# Patient Record
Sex: Male | Born: 1950 | Race: Black or African American | Hispanic: No | Marital: Married | Smoking: Never smoker
Health system: Southern US, Community
[De-identification: ages and names within clinical notes are randomized; demographics above are authoritative.]

## PROBLEM LIST (undated history)

## (undated) DIAGNOSIS — I10 Essential (primary) hypertension: Secondary | ICD-10-CM

## (undated) DIAGNOSIS — I5189 Other ill-defined heart diseases: Secondary | ICD-10-CM

## (undated) DIAGNOSIS — M5126 Other intervertebral disc displacement, lumbar region: Secondary | ICD-10-CM

## (undated) DIAGNOSIS — E119 Type 2 diabetes mellitus without complications: Secondary | ICD-10-CM

## (undated) DIAGNOSIS — I499 Cardiac arrhythmia, unspecified: Secondary | ICD-10-CM

## (undated) DIAGNOSIS — N529 Male erectile dysfunction, unspecified: Secondary | ICD-10-CM

## (undated) DIAGNOSIS — N400 Enlarged prostate without lower urinary tract symptoms: Secondary | ICD-10-CM

## (undated) HISTORY — DX: Male erectile dysfunction, unspecified: N52.9

## (undated) HISTORY — PX: BACK SURGERY: SHX140

## (undated) HISTORY — DX: Essential (primary) hypertension: I10

## (undated) HISTORY — DX: Type 2 diabetes mellitus without complications: E11.9

## (undated) HISTORY — PX: COLONOSCOPY: SHX174

## (undated) HISTORY — DX: Benign prostatic hyperplasia without lower urinary tract symptoms: N40.0

---

## 2003-08-23 ENCOUNTER — Ambulatory Visit (HOSPITAL_COMMUNITY): Admission: RE | Admit: 2003-08-23 | Discharge: 2003-08-23 | Payer: Self-pay | Admitting: Family Medicine

## 2004-08-08 ENCOUNTER — Emergency Department (HOSPITAL_COMMUNITY): Admission: EM | Admit: 2004-08-08 | Discharge: 2004-08-08 | Payer: Self-pay | Admitting: Emergency Medicine

## 2009-11-02 ENCOUNTER — Ambulatory Visit: Payer: Self-pay | Admitting: Gastroenterology

## 2009-11-15 ENCOUNTER — Ambulatory Visit: Payer: Self-pay | Admitting: Gastroenterology

## 2009-12-11 ENCOUNTER — Encounter: Payer: Self-pay | Admitting: Gastroenterology

## 2011-05-17 NOTE — Procedures (Signed)
   Jeremy Rivera, Jeremy Rivera                         ACCOUNT NO.:  [id number]   MEDICAL RECORD NO.:  [id number]                   PATIENT TYPE:  OUT   LOCATION:  [facility]                                 FACILITY:  [facility]   PHYSICIAN:  Donna Bernard, M.D.             DATE OF BIRTH:  [birth year]   DATE OF PROCEDURE:  08/23/2003  DATE OF DISCHARGE:  08/23/2003                                    STRESS TEST   INDICATION FOR TEST:  The patient is a 60 year old black male with a history  of atypical chest discomfort along with risk factors including hypertension.   Stress test was performed at standard Bruce protocol.  Resting EKG revealed  normal sinus rhythm with no significant ST-T changes.  The patient tolerated  the first two stages relatively well.  During the third stage the patient  became progressively short of breath.  He reached a heart rate of 145 with a  sub max predicted heart rate at 141 at two minutes 45 seconds into the test.  At this point, at 0.08 seconds past the J point there were no significant  changes in the ST segment.  At 0.08 seconds past the J point on the  anterolateral leads the slope was nicely ascending and at most, the  depression was 1 mm or less.  In addition, the chest leads were somewhat  compromised by artifact.  However, at the current heart rate of 145 there  were no obvious ST abnormalities that would fit the criteria for ischemia.   IMPRESSION:  Negative adequate stress test.   PLAN:  The patient encouraged to continue his exercise program and watch his  diet.      ___________________________________________                                            Donna Bernard, M.D.   WSL/MEDQ  D:  09/20/2003  T:  09/20/2003  Job:  [id number]

## 2011-05-17 NOTE — Procedures (Signed)
NAME:  EDRIK, RUNDLE                         ACCOUNT NO.:  [id number]   MEDICAL RECORD NO.:  [id number]                   PATIENT TYPE:  EMS   LOCATION:  ED                                   FACILITY:  [facility]   PHYSICIAN:  Edward L. Juanetta Gosling, M.D.             DATE OF BIRTH:  14-Mar-1951   DATE OF PROCEDURE:  08/08/2004  DATE OF DISCHARGE:  08/08/2004                                EKG INTERPRETATION   TIME:  Is 2112 on August 08, 2004.   The rhythm appears to be sinus rhythm with a rate in the 60s.  There are  PVCs.  There is a suggestion of left atrial enlargement.  QT interval seems  to be prolonged which may indicate a drug effect, electrolyte imbalance, or  primary myocardial disease.  Abnormal electrocardiogram.      ___________________________________________                                            Oneal Deputy. Juanetta Gosling, M.D.   ELH/MEDQ  D:  08/15/2004  T:  08/15/2004  Job:  [id number]

## 2013-07-17 ENCOUNTER — Encounter: Payer: Self-pay | Admitting: *Deleted

## 2013-07-21 ENCOUNTER — Encounter: Payer: Self-pay | Admitting: Family Medicine

## 2013-07-21 ENCOUNTER — Ambulatory Visit (INDEPENDENT_AMBULATORY_CARE_PROVIDER_SITE_OTHER): Payer: BC Managed Care – PPO | Admitting: Family Medicine

## 2013-07-21 VITALS — BP 150/94 | HR 70 | Wt 259.0 lb

## 2013-07-21 DIAGNOSIS — N529 Male erectile dysfunction, unspecified: Secondary | ICD-10-CM

## 2013-07-21 DIAGNOSIS — I10 Essential (primary) hypertension: Secondary | ICD-10-CM

## 2013-07-21 DIAGNOSIS — E785 Hyperlipidemia, unspecified: Secondary | ICD-10-CM

## 2013-07-21 DIAGNOSIS — N400 Enlarged prostate without lower urinary tract symptoms: Secondary | ICD-10-CM

## 2013-07-21 DIAGNOSIS — Z79899 Other long term (current) drug therapy: Secondary | ICD-10-CM

## 2013-07-21 DIAGNOSIS — E119 Type 2 diabetes mellitus without complications: Secondary | ICD-10-CM

## 2013-07-21 NOTE — Progress Notes (Signed)
  Subjective:    Patient ID: Jeremy Rivera, male    DOB: Dec 12, 1951, 62 y.o.   MRN: [id number]  Diabetes He has type 2 diabetes mellitus. His disease course has been fluctuating. There are no hypoglycemic associated symptoms. Pertinent negatives for diabetes include no blurred vision and no fatigue. Symptoms are worsening. Pertinent negatives for diabetic complications include no autonomic neuropathy. Risk factors for coronary artery disease include dyslipidemia, diabetes mellitus, hypertension, male sex and obesity. His weight is stable. He is following a diabetic and low fat/cholesterol diet. Meal planning includes avoidance of concentrated sweets. He participates in exercise intermittently. His home blood glucose trend is fluctuating minimally. His breakfast blood glucose is taken between 7-8 am. His breakfast blood glucose range is generally 110-130 mg/dl. An ACE inhibitor/angiotensin II receptor blocker is being taken. He does not see a podiatrist.Eye exam is current.   Irritated and painful eye one wk ago, unknown bite or sting no difficulty with vision.  Results for orders placed in visit on 07/21/13  POCT GLYCOSYLATED HEMOGLOBIN (HGB A1C)      Result Value Range   Hemoglobin A1C 7.9     patient notes his erectile dysfunction did better with the Cialis.  Patient is getting up once or twice per night to urinate.  Patient claims compliance with his blood pressure medicine. He has cut his salt intake down.  Patient claims compliance with his lipid medication. Trying to watch his diet but admits to not always the best  Review of Systems  Constitutional: Negative for fatigue.  Eyes: Negative for blurred vision.   gradual weight gain. No chest pain no shortness of breath no abdominal pain ROS otherwise negative     Objective:   Physical Exam  Alert obesity present blood pressure improved on repeat 138/82. Lungs clear. Heart rare in rhythm. Feet sensation good pulses good no significant  edema. Right high slight irritation on eyelid ocular exam normal      Assessment & Plan:  Impression 1 type 2 diabetes suboptimum control discussed with patient. #2 hypertension good control. #3 hyperlipidemia status uncertain. #4 rectal dysfunction ongoing. #5 prostate hypertrophy ongoing. Plan appropriate blood work. Medications refilled. Diet exercise discussed and strongly encourage. Recheck in 6 months. WSL

## 2013-07-25 DIAGNOSIS — E785 Hyperlipidemia, unspecified: Secondary | ICD-10-CM | POA: Insufficient documentation

## 2013-07-25 DIAGNOSIS — E1165 Type 2 diabetes mellitus with hyperglycemia: Secondary | ICD-10-CM | POA: Insufficient documentation

## 2013-07-25 DIAGNOSIS — N529 Male erectile dysfunction, unspecified: Secondary | ICD-10-CM | POA: Insufficient documentation

## 2013-07-25 DIAGNOSIS — I10 Essential (primary) hypertension: Secondary | ICD-10-CM | POA: Insufficient documentation

## 2013-07-25 DIAGNOSIS — IMO0002 Reserved for concepts with insufficient information to code with codable children: Secondary | ICD-10-CM | POA: Insufficient documentation

## 2013-07-25 DIAGNOSIS — E782 Mixed hyperlipidemia: Secondary | ICD-10-CM | POA: Insufficient documentation

## 2013-07-25 DIAGNOSIS — N400 Enlarged prostate without lower urinary tract symptoms: Secondary | ICD-10-CM | POA: Insufficient documentation

## 2013-09-19 ENCOUNTER — Other Ambulatory Visit: Payer: Self-pay | Admitting: Family Medicine

## 2013-09-21 LAB — LIPID PANEL
Cholesterol: 196 mg/dL (ref 0–200)
HDL: 35 mg/dL — ABNORMAL LOW (ref >39)
Total CHOL/HDL Ratio: 5.6 Ratio
VLDL: 32 mg/dL (ref 0–40)

## 2013-09-21 LAB — HEPATIC FUNCTION PANEL
ALT: 14 U/L (ref 0–53)
AST: 13 U/L (ref 0–37)
Albumin: 4.3 g/dL (ref 3.5–5.2)
Bilirubin, Direct: 0.1 mg/dL (ref 0.0–0.3)
Total Bilirubin: 0.5 mg/dL (ref 0.3–1.2)

## 2013-09-22 LAB — MICROALBUMIN, URINE: Microalb, Ur: 0.59 mg/dL (ref 0.00–1.89)

## 2013-09-23 ENCOUNTER — Encounter: Payer: Self-pay | Admitting: Family Medicine

## 2013-12-06 ENCOUNTER — Ambulatory Visit (INDEPENDENT_AMBULATORY_CARE_PROVIDER_SITE_OTHER): Payer: BC Managed Care – PPO | Admitting: Family Medicine

## 2013-12-06 ENCOUNTER — Encounter: Payer: Self-pay | Admitting: Family Medicine

## 2013-12-06 VITALS — BP 128/86 | Ht 71.5 in | Wt 263.8 lb

## 2013-12-06 DIAGNOSIS — E119 Type 2 diabetes mellitus without complications: Secondary | ICD-10-CM

## 2013-12-06 LAB — POCT GLYCOSYLATED HEMOGLOBIN (HGB A1C): Hemoglobin A1C: 7.6

## 2013-12-06 NOTE — Progress Notes (Signed)
   Subjective:    Patient ID: Jeremy Rivera, male    DOB: 1951/01/19, 62 y.o.   MRN: [id number]  HPI Patient arrives for an annual physical.   Has not had shingles vaccine.pneum 2007. insur won't pay for flu shot.  No problems or concerns per patient.  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  Exercsing: none,  Deep left thigh pain for awhile, now sig better  Results for orders placed in visit on 12/06/13  POCT GLYCOSYLATED HEMOGLOBIN (HGB A1C)      Result Value Range   Hemoglobin A1C 7.6     No exercise equip around the house. Now daught er hsw his treadmill   Review of Systems  Constitutional: Negative for fever, activity change and appetite change.  HENT: Negative for congestion and rhinorrhea.   Eyes: Negative for discharge.  Respiratory: Negative for cough and wheezing.   Cardiovascular: Negative for chest pain.  Gastrointestinal: Negative for vomiting, abdominal pain and blood in stool.  Genitourinary: Negative for frequency and difficulty urinating.  Musculoskeletal: Negative for neck pain.  Skin: Negative for rash.  Allergic/Immunologic: Negative for environmental allergies and food allergies.  Neurological: Negative for weakness and headaches.  Psychiatric/Behavioral: Negative for agitation.       Objective:   Physical Exam  Vitals reviewed. Constitutional: He appears well-developed and well-nourished.  HENT:  Head: Normocephalic and atraumatic.  Right Ear: External ear normal.  Left Ear: External ear normal.  Nose: Nose normal.  Mouth/Throat: Oropharynx is clear and moist.  Eyes: EOM are normal. Pupils are equal, round, and reactive to light.  Neck: Normal range of motion. Neck supple. No thyromegaly present.  Cardiovascular: Normal rate, regular rhythm and normal heart sounds.   No murmur heard. Pulmonary/Chest: Effort normal and breath sounds normal. No respiratory distress. He has no wheezes.  Abdominal: Soft. Bowel sounds are normal. He  exhibits no distension and no mass. There is no tenderness.  Genitourinary: Penis normal.  Musculoskeletal: Normal range of motion. He exhibits no edema.  Lymphadenopathy:    He has no cervical adenopathy.  Neurological: He is alert. He exhibits normal muscle tone.  Skin: Skin is warm and dry. No erythema.  Psychiatric: He has a normal mood and affect. His behavior is normal. Judgment normal.          Assessment & Plan:  Impression wellness exam #2 hypertension good control. #3 type 2 diabetes fair control #4 prostate hypertrophy discussed #5 hyperlipidemia plan maintain medications. Diet exercise discussed. Appropriate blood work. Vaccines discussed. WSL

## 2013-12-17 ENCOUNTER — Other Ambulatory Visit: Payer: Self-pay | Admitting: Family Medicine

## 2013-12-22 ENCOUNTER — Other Ambulatory Visit: Payer: Self-pay | Admitting: Family Medicine

## 2014-03-22 ENCOUNTER — Ambulatory Visit (INDEPENDENT_AMBULATORY_CARE_PROVIDER_SITE_OTHER): Payer: BC Managed Care – PPO | Admitting: Family Medicine

## 2014-03-22 ENCOUNTER — Encounter: Payer: Self-pay | Admitting: Family Medicine

## 2014-03-22 VITALS — BP 156/98 | Ht 71.5 in | Wt 261.0 lb

## 2014-03-22 DIAGNOSIS — E119 Type 2 diabetes mellitus without complications: Secondary | ICD-10-CM

## 2014-03-22 DIAGNOSIS — I10 Essential (primary) hypertension: Secondary | ICD-10-CM

## 2014-03-22 DIAGNOSIS — Z125 Encounter for screening for malignant neoplasm of prostate: Secondary | ICD-10-CM

## 2014-03-22 DIAGNOSIS — E785 Hyperlipidemia, unspecified: Secondary | ICD-10-CM

## 2014-03-22 LAB — POCT GLYCOSYLATED HEMOGLOBIN (HGB A1C): Hemoglobin A1C: 8.8

## 2014-03-22 MED ORDER — GLIPIZIDE 5 MG PO TABS: 5.0000 mg | ORAL_TABLET | Freq: Two times a day (BID) | ORAL | Status: DC

## 2014-03-22 MED ORDER — SILDENAFIL CITRATE 100 MG PO TABS: 100.0000 mg | ORAL_TABLET | ORAL | Status: DC | PRN

## 2014-03-22 NOTE — Progress Notes (Signed)
   Subjective:    Patient ID: Jeremy McardleRonnie E Rivera, male    DOB: 1951/12/19, 63 y.o.   MRN: [id number]  Diabetes He presents for his follow-up diabetic visit. He has type 2 diabetes mellitus. There are no hypoglycemic associated symptoms. There are no diabetic associated symptoms. There are no hypoglycemic complications. Current diabetic treatment includes diet. He rarely participates in exercise. There is no change in his home blood glucose trend. His breakfast blood glucose range is generally 130-140 mg/dl.   Results for orders placed in visit on 03/22/14  POCT GLYCOSYLATED HEMOGLOBIN (HGB A1C)      Result Value Ref Range   Hemoglobin A1C 8.8      Taking the glyb around once or twice per day fact in fact most days in fact most days patient states most days he uses only one.  No low sugar spells  Patient reports that in the past Viagra has worked better for him so he like to switch back to that.  Compliant with blood pressure medicines. No obvious side effects. Watching his salt intake.  Admits to not the best dietary compliance.   Numb when ck'ed 140 to 164  Exercise so so,   BP meds overall good compliance,   Review of Systems  no headache or chest pain no back pain no abdominal pain no change in bowel habits no blood in stool    Objective:   Physical Exam  Alert no apparent distress. HEENT normal. Vitals reviewed. Blood pressure good on repeat 138/70. Lungs clear. Heart regular in rhythm. Ankles edema.   impression hypertension good control. #2 rectal dysfunction ongoing challenge. #3 type 2 diabetes poor control discussed. We need to change. Plan increase medication. We'll change glyburide to glipizide. 5 mg twice a day. Maintain other meds. Utilize Viagra. Diet exercise discussed. Recheck in several months. Appropriate blood work further recommendations based on results WSL     Assessment & Plan: impression 1see above

## 2014-03-22 NOTE — Patient Instructions (Signed)
CALL US in two weeks with fasting numbers!!!!!!!!!!!!!!!!!!!!!!!!!!!!!!!!!!!!!!!!!!!!!!!!!!!!!!!!!!!

## 2014-03-24 ENCOUNTER — Encounter: Payer: Self-pay | Admitting: Family Medicine

## 2014-03-25 ENCOUNTER — Telehealth: Payer: Self-pay | Admitting: Family Medicine

## 2014-03-25 NOTE — Telephone Encounter (Signed)
Patient needs new prescription for glucose monitor and strips called into [facility]

## 2014-03-25 NOTE — Telephone Encounter (Signed)
Script faxed to pharmacy. Patient was notified.  

## 2014-04-05 LAB — LIPID PANEL
Cholesterol: 184 mg/dL (ref 0–200)
HDL: 37 mg/dL — AB (ref >39)
LDL CALC: 125 mg/dL — AB (ref 0–99)
TRIGLYCERIDES: 111 mg/dL (ref <150)
Total CHOL/HDL Ratio: 5 Ratio
VLDL: 22 mg/dL (ref 0–40)

## 2014-04-05 LAB — BASIC METABOLIC PANEL
BUN: 13 mg/dL (ref 6–23)
CALCIUM: 9.4 mg/dL (ref 8.4–10.5)
CHLORIDE: 99 meq/L (ref 96–112)
CO2: 29 mEq/L (ref 19–32)
CREATININE: 0.9 mg/dL (ref 0.50–1.35)
Glucose, Bld: 139 mg/dL — ABNORMAL HIGH (ref 70–99)
Potassium: 4.1 mEq/L (ref 3.5–5.3)
Sodium: 139 mEq/L (ref 135–145)

## 2014-04-06 LAB — PSA: PSA: 6.47 ng/mL — AB (ref <=4.00)

## 2014-04-11 ENCOUNTER — Telehealth: Payer: Self-pay | Admitting: Family Medicine

## 2014-04-11 NOTE — Telephone Encounter (Signed)
Increase glipizide to one and a half tab each morn and one tab each eve

## 2014-04-11 NOTE — Telephone Encounter (Signed)
Left message to return call 

## 2014-04-11 NOTE — Telephone Encounter (Signed)
Patients BS readings for the last 2 weeks: 152 152 151 140 137 143 124 133 148 148  142 141 135 127 127

## 2014-04-14 NOTE — Telephone Encounter (Signed)
Notified patient increase glipizide to one and a half tab each morn and one tab each eve. Patient verbalized understanding.

## 2014-04-27 ENCOUNTER — Other Ambulatory Visit: Payer: Self-pay | Admitting: Family Medicine

## 2014-05-19 ENCOUNTER — Ambulatory Visit (INDEPENDENT_AMBULATORY_CARE_PROVIDER_SITE_OTHER): Payer: Medicare Other | Admitting: Family Medicine

## 2014-05-19 ENCOUNTER — Encounter: Payer: Self-pay | Admitting: Family Medicine

## 2014-05-19 ENCOUNTER — Telehealth: Payer: Self-pay | Admitting: Family Medicine

## 2014-05-19 VITALS — BP 170/100 | Ht 71.5 in | Wt 268.0 lb

## 2014-05-19 DIAGNOSIS — M25569 Pain in unspecified knee: Secondary | ICD-10-CM | POA: Diagnosis not present

## 2014-05-19 MED ORDER — ETODOLAC 400 MG PO TABS: 400.0000 mg | ORAL_TABLET | Freq: Two times a day (BID) | ORAL | Status: DC

## 2014-05-19 MED ORDER — HYDROCODONE-ACETAMINOPHEN 5-325 MG PO TABS: 1.0000 | ORAL_TABLET | Freq: Four times a day (QID) | ORAL | Status: DC | PRN

## 2014-05-19 MED ORDER — HYDROCODONE-ACETAMINOPHEN 5-325 MG PO TABS: 1.0000 | ORAL_TABLET | ORAL | Status: DC | PRN

## 2014-05-19 MED ORDER — GLIPIZIDE 5 MG PO TABS: 5.0000 mg | ORAL_TABLET | Freq: Two times a day (BID) | ORAL | Status: DC

## 2014-05-19 MED ORDER — HYDROCODONE-ACETAMINOPHEN 5-325 MG PO TABS: 30.0000 | ORAL_TABLET | Freq: Four times a day (QID) | ORAL | Status: DC | PRN

## 2014-05-19 NOTE — Telephone Encounter (Signed)
Done

## 2014-05-19 NOTE — Telephone Encounter (Signed)
Patient needs refill of glipizide 5 mg tab

## 2014-05-19 NOTE — Progress Notes (Signed)
   Subjective:    Patient ID: Jeremy Rivera, male    DOB: 08/12/1951, 63 y.o.   MRN: [id number]  Leg Pain  Incident onset: Monday. The incident occurred at home. There was no injury mechanism ("Over did-it" at work. Is not supposed to be lifting anything over 50lbs, and he over excerted himself at work last week.). The pain is present in the left thigh (left calf). The quality of the pain is described as aching and stabbing. The pain has been fluctuating since onset. He reports no foreign bodies present. The symptoms are aggravated by weight bearing (lying in the bed). He has tried NSAIDs for the symptoms. The treatment provided mild relief.   Had back surgery 20 years ago, and this pain is similar to that pain.  Of note did substantial extra work around the yard yesterday  This no change in urinary or bowel habits Review of Systems No fever no chest pain no abdominal pain no specific back pain.    Objective:   Physical Exam  Alert no apparent distress. Lungs clear. Heart regular in rhythm. Spine nontender. Left leg straight leg raise negative. Some deep lateral but it discomfort to deep palpation but not a lot      Assessment & Plan:    Impression nonspecific leg pain doubt true sciatica. Rationale discussed. Plan trial of anti-inflammatory medicine. Hydrocodone when necessary. Expect gradual resolution if not return for more further workup and

## 2014-05-25 ENCOUNTER — Telehealth: Payer: Self-pay | Admitting: Family Medicine

## 2014-05-25 MED ORDER — CHLORZOXAZONE 500 MG PO TABS: 500.0000 mg | ORAL_TABLET | Freq: Three times a day (TID) | ORAL | Status: DC | PRN

## 2014-05-25 NOTE — Telephone Encounter (Signed)
Patient is hoping that we can call him in a muscle relaxer, because he is still have the leg pain he was seen for last week. He said when he wakes up in the morning, it is like a cramp in muscle of calf. Its a little better, but not completely.    [facility].

## 2014-05-25 NOTE — Telephone Encounter (Signed)
Chlorzoxazone 5oo numb thirty one tid prn spasm, if persists into next wk rec f u visit

## 2014-05-25 NOTE — Telephone Encounter (Signed)
Patient is hoping that we can call him in a muscle relaxer, because he is still have the leg pain he was seen for last week. He said when he wakes up in the morning, it is like a cramp in muscle of calf. Its a little better, but not completely. ° ° ° °[facility]. °

## 2014-05-25 NOTE — Telephone Encounter (Signed)
Rx sent electronically to pharmacy. Patient notified. 

## 2014-06-02 ENCOUNTER — Telehealth: Payer: Self-pay | Admitting: Family Medicine

## 2014-06-02 NOTE — Telephone Encounter (Addendum)
Patient says that the muscle relaxer, anti-inflammatory, and pain medicine has helped some for the pain he was experiencing in his leg, but just doesn't feel like it's doing enough. He says that he wanted to come in for an appointment to have Dr. Brett Canales reassess, but at this point, we are looking at him not being able to get an appointment until week after next. He thinks he may need an xray and change up his medications. Please advise.  [facility]

## 2014-06-02 NOTE — Telephone Encounter (Signed)
Let's see tomorrow

## 2014-06-03 ENCOUNTER — Encounter: Payer: Self-pay | Admitting: Family Medicine

## 2014-06-03 ENCOUNTER — Ambulatory Visit (INDEPENDENT_AMBULATORY_CARE_PROVIDER_SITE_OTHER): Payer: Medicare Other | Admitting: Family Medicine

## 2014-06-03 ENCOUNTER — Other Ambulatory Visit: Payer: Self-pay

## 2014-06-03 VITALS — BP 170/104 | Ht 71.5 in | Wt 262.2 lb

## 2014-06-03 DIAGNOSIS — M5430 Sciatica, unspecified side: Secondary | ICD-10-CM

## 2014-06-03 MED ORDER — HYDROCODONE-ACETAMINOPHEN 5-325 MG PO TABS: 1.0000 | ORAL_TABLET | Freq: Four times a day (QID) | ORAL | Status: DC | PRN

## 2014-06-03 NOTE — Progress Notes (Signed)
   Subjective:    Patient ID: Jeremy Rivera, male    DOB: [birth year], 63 y.o.   MRN: [id number]  HPI Patient arrives for follow up on left leg pain. Patient states he is currently on pain med and muscle relaxer but has seen little improvement. Patient states he has not taken his blood pressure med this am and that is why his blood pressure is so high.  Hx of low back surg, had disc surgery, had a work injury remotely  No sig pain in the back  Had pressure on hard surface by sitting, plus excessive yard work and lifting and pulling stuff in the yard   At times a deep ache which impacts upon sleep. Definitely effecting ability to do what he needs to do.   Deep ache in the left posterior hip deep ache in the left side. Deep ache in the left calf. Comes on with any kind of walking or movement. Anti-inflammatory subsequent helped some. Review of Systems No chest pain no change in bowel habits no blood in stool no urinary symptoms    Objective:   Physical Exam Alert positive distress with certain motions vital signs stable. Lungs clear. Heart regular in rhythm. Left leg exam distal sensation pulses good. Reflex question diminished at ankle. Knees positive crepitations. Significant straight leg raise on left. Posterior left sciatic notch tenderness. Range of motion of hip decent. No low back tenderness       Assessment & Plan:  Impression progressive sciatica. With history of prior surgery. Definitely impacting significantly. Plan  No prednisone beause of diabetes. Re further recommendations based on results. WSLfill hydrocodone. Maintain anti-inflammatory. MRI

## 2014-06-07 DIAGNOSIS — M47817 Spondylosis without myelopathy or radiculopathy, lumbosacral region: Secondary | ICD-10-CM | POA: Diagnosis not present

## 2014-06-10 ENCOUNTER — Telehealth: Payer: Self-pay | Admitting: Family Medicine

## 2014-06-10 ENCOUNTER — Other Ambulatory Visit: Payer: Self-pay | Admitting: Family Medicine

## 2014-06-10 DIAGNOSIS — M5116 Intervertebral disc disorders with radiculopathy, lumbar region: Secondary | ICD-10-CM

## 2014-06-10 NOTE — Telephone Encounter (Signed)
Results discussed with patient. Patient verbalized understanding and stated he would like to see neurosur[place] your [place].

## 2014-06-10 NOTE — Telephone Encounter (Signed)
His MRI was reviewed in detail. 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 There also seems to be some impingement of the nerve running in on the left side. He might benefit from seeing a specialist. More than likely Jeremy Rivera. would lean toward trying injections first. If that did not help they would consider surgery. We [place] or specialist of his choice. Please discuss with patient and let us know.

## 2014-06-10 NOTE — Telephone Encounter (Signed)
The referral was put into motion 

## 2014-06-10 NOTE — Telephone Encounter (Signed)
Results of MRI

## 2014-06-13 ENCOUNTER — Encounter: Payer: Self-pay | Admitting: Family Medicine

## 2014-06-14 ENCOUNTER — Other Ambulatory Visit: Payer: Self-pay | Admitting: Neurosurgery

## 2014-06-14 DIAGNOSIS — I10 Essential (primary) hypertension: Secondary | ICD-10-CM | POA: Diagnosis not present

## 2014-06-14 DIAGNOSIS — Z6835 Body mass index (BMI) 35.0-35.9, adult: Secondary | ICD-10-CM | POA: Diagnosis not present

## 2014-06-14 DIAGNOSIS — M5126 Other intervertebral disc displacement, lumbar region: Secondary | ICD-10-CM | POA: Diagnosis not present

## 2014-06-20 ENCOUNTER — Encounter (HOSPITAL_COMMUNITY)
Admission: RE | Admit: 2014-06-20 | Discharge: 2014-06-20 | Disposition: A | Discharge status: Home / Self Care | Payer: Medicare Other | Source: Ambulatory Visit | Attending: Anesthesiology | Admitting: Anesthesiology

## 2014-06-20 ENCOUNTER — Encounter (HOSPITAL_COMMUNITY)
Admission: RE | Admit: 2014-06-20 | Discharge: 2014-06-20 | Disposition: A | Discharge status: Home / Self Care | Payer: Medicare Other | Source: Ambulatory Visit | Attending: Neurosurgery | Admitting: Neurosurgery

## 2014-06-20 ENCOUNTER — Encounter (HOSPITAL_COMMUNITY): Payer: Self-pay

## 2014-06-20 DIAGNOSIS — E119 Type 2 diabetes mellitus without complications: Secondary | ICD-10-CM | POA: Diagnosis not present

## 2014-06-20 DIAGNOSIS — M5126 Other intervertebral disc displacement, lumbar region: Secondary | ICD-10-CM | POA: Diagnosis not present

## 2014-06-20 DIAGNOSIS — I10 Essential (primary) hypertension: Secondary | ICD-10-CM | POA: Diagnosis not present

## 2014-06-20 DIAGNOSIS — Z01812 Encounter for preprocedural laboratory examination: Secondary | ICD-10-CM | POA: Diagnosis not present

## 2014-06-20 DIAGNOSIS — Z01810 Encounter for preprocedural cardiovascular examination: Secondary | ICD-10-CM | POA: Diagnosis not present

## 2014-06-20 DIAGNOSIS — Z6835 Body mass index (BMI) 35.0-35.9, adult: Secondary | ICD-10-CM | POA: Diagnosis not present

## 2014-06-20 DIAGNOSIS — Z01818 Encounter for other preprocedural examination: Secondary | ICD-10-CM | POA: Diagnosis not present

## 2014-06-20 HISTORY — DX: Other intervertebral disc displacement, lumbar region: M51.26

## 2014-06-20 LAB — SURGICAL PCR SCREEN
MRSA, PCR: NEGATIVE
Staphylococcus aureus: NEGATIVE

## 2014-06-20 LAB — BASIC METABOLIC PANEL
BUN: 14 mg/dL (ref 6–23)
CO2: 27 mEq/L (ref 19–32)
Calcium: 9.9 mg/dL (ref 8.4–10.5)
Chloride: 102 mEq/L (ref 96–112)
Creatinine, Ser: 0.96 mg/dL (ref 0.50–1.35)
GFR calc Af Amer: >90 mL/min (ref >90)
GFR, EST NON AFRICAN AMERICAN: 85 mL/min — AB (ref >90)
GLUCOSE: 108 mg/dL — AB (ref 70–99)
Potassium: 4.2 mEq/L (ref 3.7–5.3)
SODIUM: 142 meq/L (ref 137–147)

## 2014-06-20 LAB — CBC
HCT: 45 % (ref 39.0–52.0)
Hemoglobin: 14.5 g/dL (ref 13.0–17.0)
MCH: 27.9 pg (ref 26.0–34.0)
MCHC: 32.2 g/dL (ref 30.0–36.0)
MCV: 86.5 fL (ref 78.0–100.0)
PLATELETS: 223 10*3/uL (ref 150–400)
RBC: 5.2 MIL/uL (ref 4.22–5.81)
RDW: 14.5 % (ref 11.5–15.5)
WBC: 7 10*3/uL (ref 4.0–10.5)

## 2014-06-20 NOTE — Pre-Procedure Instructions (Signed)
Jeremy Rivera  06/20/2014   Your procedure is scheduled on:  Wednesday, June 24  Report to [facility] [facility] Admitting at 1:30 PM.  Call this number if you have problems the morning of surgery: [phone]   Remember:   Do not eat food or drink liquids after midnight.Tuesday night     Take these medicines the morning of surgery with A SIP OF WATER: atenolol,Hydrcodone,if needed  Stop aspirin, vitamins, any blood thinners or herbal medications or teas.   Do not wear jewelry, make-up or nail polish.  Do not wear lotions, powders, or perfumes. You may wear deodorant.  Do not shave 48 hours prior to surgery. Men may shave face and neck.  Do not bring valuables to the hospital.  [facility] is not responsible    for any belongings or valuables.               Contacts, dentures or bridgework may not be worn into surgery.  Leave suitcase in the car. After surgery it may be brought to your room.  For patients admitted to the hospital, discharge time is determined by your    treatment team.                Special Instructions: [place] - Preparing for Surgery  Before surgery, you can play an important role.  Because skin is not sterile, your skin needs to be as free of germs as possible.  You can reduce the number of germs on you skin by washing with CHG (chlorahexidine gluconate) soap before surgery.  CHG is an antiseptic cleaner which kills germs and bonds with the skin to continue killing germs even after washing.  Please DO NOT use if you have an allergy to CHG or antibacterial soaps.  If your skin becomes reddened/irritated stop using the CHG and inform your nurse when you arrive at Short Stay.  Do not shave (including legs and underarms) for at least 48 hours prior to the first CHG shower.  You may shave your face.  Please follow these instructions carefully:   1.  Shower with CHG Soap the night before surgery and the   morning of Surgery.  2.  If you choose to wash your  hair, wash your hair first as usual with your   normal shampoo.  3.  After you shampoo, rinse your hair and body thoroughly to remove the   Shampoo.  4.  Use CHG as you would any other liquid soap.  You can apply chg directly  to the skin and wash gently with scrungie or a clean washcloth.  5.  Apply the CHG Soap to your body ONLY FROM THE NECK DOWN.   Do not use on open wounds or open sores.  Avoid contact with your eyes,  ears, mouth and genitals (private parts).  Wash genitals (private parts)   with your normal soap.  6.  Wash thoroughly, paying special attention to the area where your surgery   will be performed.  7.  Thoroughly rinse your body with warm water from the neck down.  8.  DO NOT shower/wash with your normal soap after using and rinsing off   the CHG Soap.  9.  Pat yourself dry with a clean towel.            10.  Wear clean pajamas.            11.  Place clean sheets on your bed the night of your  first shower and do not   sleep with pets.  Day of Surgery  Do not apply any lotions/deoderants the morning of surgery.  Please wear clean clothes to the hospital/surgery center.     Please read over the following fact sheets that you were given: Pain Booklet, Coughing and Deep Breathing and Surgical Site Infection Prevention

## 2014-06-20 NOTE — Progress Notes (Signed)
06/20/14 1515  OBSTRUCTIVE SLEEP APNEA  Have you ever been diagnosed with sleep apnea through a sleep study? No  Do you snore loudly (loud enough to be heard through closed doors)?  0  Do you often feel tired, fatigued, or sleepy during the daytime? 0  Has anyone observed you stop breathing during your sleep? 0  Do you have, or are you being treated for high blood pressure? 1  BMI more than 35 kg/m2? 1  Age over 65 years old? 1  Neck circumference greater than 40 cm/16 inches? 1  Gender: 1  Obstructive Sleep Apnea Score 5  Score 4 or greater  Results sent to PCP (Dr Ivan AnchorsSteve Lukin, [facility])

## 2014-06-21 ENCOUNTER — Encounter (HOSPITAL_COMMUNITY): Payer: Self-pay | Admitting: Pharmacy Technician

## 2014-06-21 MED ORDER — CEFAZOLIN SODIUM-DEXTROSE 2-3 GM-% IV SOLR
2.0000 g | INTRAVENOUS | Status: AC
  Administered 2014-06-22: 2 g via INTRAVENOUS
  Filled 2014-06-21: qty 50

## 2014-06-22 ENCOUNTER — Encounter (HOSPITAL_COMMUNITY)
Admission: RE | Disposition: A | Discharge status: Home / Self Care | Payer: Self-pay | Source: Ambulatory Visit | Attending: Neurosurgery

## 2014-06-22 ENCOUNTER — Encounter (HOSPITAL_COMMUNITY): Payer: Medicare Other | Admitting: Certified Registered Nurse Anesthetist

## 2014-06-22 ENCOUNTER — Ambulatory Visit (HOSPITAL_COMMUNITY)
Admission: RE | Admit: 2014-06-22 | Discharge: 2014-06-23 | Disposition: A | Discharge status: Home / Self Care | Payer: Medicare Other | Source: Ambulatory Visit | Attending: Neurosurgery | Admitting: Neurosurgery

## 2014-06-22 ENCOUNTER — Encounter (HOSPITAL_COMMUNITY): Payer: Self-pay | Admitting: Certified Registered Nurse Anesthetist

## 2014-06-22 ENCOUNTER — Ambulatory Visit (HOSPITAL_COMMUNITY): Payer: Medicare Other

## 2014-06-22 ENCOUNTER — Ambulatory Visit (HOSPITAL_COMMUNITY): Payer: Medicare Other | Admitting: Certified Registered Nurse Anesthetist

## 2014-06-22 DIAGNOSIS — Z01812 Encounter for preprocedural laboratory examination: Secondary | ICD-10-CM | POA: Insufficient documentation

## 2014-06-22 DIAGNOSIS — M5126 Other intervertebral disc displacement, lumbar region: Secondary | ICD-10-CM | POA: Insufficient documentation

## 2014-06-22 DIAGNOSIS — M5020 Other cervical disc displacement, unspecified cervical region: Secondary | ICD-10-CM | POA: Diagnosis present

## 2014-06-22 DIAGNOSIS — Z01818 Encounter for other preprocedural examination: Secondary | ICD-10-CM | POA: Diagnosis not present

## 2014-06-22 DIAGNOSIS — Z01810 Encounter for preprocedural cardiovascular examination: Secondary | ICD-10-CM | POA: Diagnosis not present

## 2014-06-22 DIAGNOSIS — E119 Type 2 diabetes mellitus without complications: Secondary | ICD-10-CM | POA: Insufficient documentation

## 2014-06-22 DIAGNOSIS — I10 Essential (primary) hypertension: Secondary | ICD-10-CM | POA: Insufficient documentation

## 2014-06-22 DIAGNOSIS — Z6835 Body mass index (BMI) 35.0-35.9, adult: Secondary | ICD-10-CM | POA: Insufficient documentation

## 2014-06-22 DIAGNOSIS — M519 Unspecified thoracic, thoracolumbar and lumbosacral intervertebral disc disorder: Secondary | ICD-10-CM | POA: Diagnosis not present

## 2014-06-22 HISTORY — PX: LUMBAR LAMINECTOMY/DECOMPRESSION MICRODISCECTOMY: SHX5026

## 2014-06-22 LAB — GLUCOSE, CAPILLARY
GLUCOSE-CAPILLARY: 130 mg/dL — AB (ref 70–99)
GLUCOSE-CAPILLARY: 125 mg/dL — AB (ref 70–99)
GLUCOSE-CAPILLARY: 215 mg/dL — AB (ref 70–99)

## 2014-06-22 SURGERY — LUMBAR LAMINECTOMY/DECOMPRESSION MICRODISCECTOMY 1 LEVEL
Anesthesia: General | Site: Back | Laterality: Left

## 2014-06-22 MED ORDER — ACETAMINOPHEN 650 MG RE SUPP: 650.0000 mg | RECTAL | Status: DC | PRN

## 2014-06-22 MED ORDER — ASPIRIN 81 MG PO CHEW
81.0000 mg | CHEWABLE_TABLET | Freq: Every day | ORAL | Status: DC
  Administered 2014-06-22 – 2014-06-23 (×2): 81 mg via ORAL
  Filled 2014-06-22 (×2): qty 1

## 2014-06-22 MED ORDER — AMLODIPINE BESY-BENAZEPRIL HCL 10-20 MG PO CAPS: 1.0000 | ORAL_CAPSULE | Freq: Every day | ORAL | Status: DC

## 2014-06-22 MED ORDER — ROCURONIUM BROMIDE 50 MG/5ML IV SOLN
INTRAVENOUS | Status: AC
  Filled 2014-06-22: qty 1

## 2014-06-22 MED ORDER — DIAZEPAM 5 MG PO TABS
5.0000 mg | ORAL_TABLET | Freq: Four times a day (QID) | ORAL | Status: DC | PRN
  Administered 2014-06-23: 5 mg via ORAL
  Filled 2014-06-22: qty 1

## 2014-06-22 MED ORDER — OXYCODONE HCL 5 MG PO TABS: 5.0000 mg | ORAL_TABLET | Freq: Once | ORAL | Status: DC | PRN

## 2014-06-22 MED ORDER — LIDOCAINE HCL (CARDIAC) 20 MG/ML IV SOLN
INTRAVENOUS | Status: AC
  Filled 2014-06-22: qty 5

## 2014-06-22 MED ORDER — HYDROMORPHONE HCL PF 1 MG/ML IJ SOLN
INTRAMUSCULAR | Status: AC
  Filled 2014-06-22: qty 1

## 2014-06-22 MED ORDER — LACTATED RINGERS IV SOLN
INTRAVENOUS | Status: DC | PRN
  Administered 2014-06-22 (×2): via INTRAVENOUS

## 2014-06-22 MED ORDER — ONDANSETRON HCL 4 MG/2ML IJ SOLN: 4.0000 mg | INTRAMUSCULAR | Status: DC | PRN

## 2014-06-22 MED ORDER — MENTHOL 3 MG MT LOZG: 1.0000 | LOZENGE | OROMUCOSAL | Status: DC | PRN

## 2014-06-22 MED ORDER — LACTATED RINGERS IV SOLN
INTRAVENOUS | Status: DC
  Administered 2014-06-22: 13:00:00 via INTRAVENOUS

## 2014-06-22 MED ORDER — AMLODIPINE BESYLATE 10 MG PO TABS
10.0000 mg | ORAL_TABLET | Freq: Every day | ORAL | Status: DC
  Administered 2014-06-22 – 2014-06-23 (×3): 10 mg via ORAL
  Filled 2014-06-22 (×3): qty 1

## 2014-06-22 MED ORDER — ONDANSETRON HCL 4 MG/2ML IJ SOLN
INTRAMUSCULAR | Status: DC | PRN
  Administered 2014-06-22: 4 mg via INTRAVENOUS

## 2014-06-22 MED ORDER — SODIUM CHLORIDE 0.9 % IJ SOLN: 3.0000 mL | INTRAMUSCULAR | Status: DC | PRN

## 2014-06-22 MED ORDER — STERILE WATER FOR INJECTION IJ SOLN
INTRAMUSCULAR | Status: AC
  Filled 2014-06-22: qty 10

## 2014-06-22 MED ORDER — MEPERIDINE HCL 25 MG/ML IJ SOLN: 6.2500 mg | INTRAMUSCULAR | Status: DC | PRN

## 2014-06-22 MED ORDER — EPHEDRINE SULFATE 50 MG/ML IJ SOLN
INTRAMUSCULAR | Status: DC | PRN
  Administered 2014-06-22 (×2): 5 mg via INTRAVENOUS
  Administered 2014-06-22 (×4): 10 mg via INTRAVENOUS

## 2014-06-22 MED ORDER — GLYCOPYRROLATE 0.2 MG/ML IJ SOLN
INTRAMUSCULAR | Status: DC | PRN
  Administered 2014-06-22: .8 mg via INTRAVENOUS

## 2014-06-22 MED ORDER — THROMBIN 5000 UNITS EX SOLR
CUTANEOUS | Status: DC | PRN
  Administered 2014-06-22 (×2): 5000 [IU] via TOPICAL

## 2014-06-22 MED ORDER — POLYETHYLENE GLYCOL 3350 17 G PO PACK
17.0000 g | PACK | Freq: Every day | ORAL | Status: DC | PRN
  Filled 2014-06-22: qty 1

## 2014-06-22 MED ORDER — TRIAMTERENE-HCTZ 75-50 MG PO TABS
0.5000 | ORAL_TABLET | Freq: Every day | ORAL | Status: DC
  Administered 2014-06-22: 0.5 via ORAL
  Filled 2014-06-22 (×4): qty 0.5

## 2014-06-22 MED ORDER — SODIUM CHLORIDE 0.9 % IJ SOLN
3.0000 mL | Freq: Two times a day (BID) | INTRAMUSCULAR | Status: DC
  Administered 2014-06-22: 3 mL via INTRAVENOUS

## 2014-06-22 MED ORDER — GLIPIZIDE 5 MG PO TABS
5.0000 mg | ORAL_TABLET | Freq: Two times a day (BID) | ORAL | Status: DC
  Administered 2014-06-22 – 2014-06-23 (×2): 5 mg via ORAL
  Filled 2014-06-22 (×3): qty 1

## 2014-06-22 MED ORDER — BENAZEPRIL HCL 20 MG PO TABS
20.0000 mg | ORAL_TABLET | Freq: Every day | ORAL | Status: DC
  Administered 2014-06-22 – 2014-06-23 (×2): 20 mg via ORAL
  Filled 2014-06-22 (×3): qty 1

## 2014-06-22 MED ORDER — PROPOFOL 10 MG/ML IV BOLUS
INTRAVENOUS | Status: AC
  Filled 2014-06-22: qty 20

## 2014-06-22 MED ORDER — PHENOL 1.4 % MT LIQD: 1.0000 | OROMUCOSAL | Status: DC | PRN

## 2014-06-22 MED ORDER — ONDANSETRON HCL 4 MG/2ML IJ SOLN
INTRAMUSCULAR | Status: AC
  Filled 2014-06-22: qty 2

## 2014-06-22 MED ORDER — ROCURONIUM BROMIDE 100 MG/10ML IV SOLN
INTRAVENOUS | Status: DC | PRN
  Administered 2014-06-22: 50 mg via INTRAVENOUS

## 2014-06-22 MED ORDER — KETOROLAC TROMETHAMINE 30 MG/ML IJ SOLN
30.0000 mg | Freq: Four times a day (QID) | INTRAMUSCULAR | Status: DC
  Administered 2014-06-22 – 2014-06-23 (×3): 30 mg via INTRAVENOUS
  Filled 2014-06-22 (×5): qty 1

## 2014-06-22 MED ORDER — LIDOCAINE HCL (CARDIAC) 20 MG/ML IV SOLN
INTRAVENOUS | Status: DC | PRN
  Administered 2014-06-22: 100 mg via INTRAVENOUS

## 2014-06-22 MED ORDER — FENTANYL CITRATE 0.05 MG/ML IJ SOLN
INTRAMUSCULAR | Status: DC | PRN
  Administered 2014-06-22: 150 ug via INTRAVENOUS

## 2014-06-22 MED ORDER — NEOSTIGMINE METHYLSULFATE 10 MG/10ML IV SOLN
INTRAVENOUS | Status: DC | PRN
  Administered 2014-06-22: 5 mg via INTRAVENOUS

## 2014-06-22 MED ORDER — FENTANYL CITRATE 0.05 MG/ML IJ SOLN
INTRAMUSCULAR | Status: AC
  Filled 2014-06-22: qty 5

## 2014-06-22 MED ORDER — MIDAZOLAM HCL 2 MG/2ML IJ SOLN: 0.5000 mg | Freq: Once | INTRAMUSCULAR | Status: DC | PRN

## 2014-06-22 MED ORDER — LIDOCAINE-EPINEPHRINE 0.5 %-1:200000 IJ SOLN
INTRAMUSCULAR | Status: DC | PRN
  Administered 2014-06-22: 20 mL

## 2014-06-22 MED ORDER — PROMETHAZINE HCL 25 MG/ML IJ SOLN: 6.2500 mg | INTRAMUSCULAR | Status: DC | PRN

## 2014-06-22 MED ORDER — EPHEDRINE SULFATE 50 MG/ML IJ SOLN
INTRAMUSCULAR | Status: AC
  Filled 2014-06-22: qty 1

## 2014-06-22 MED ORDER — HYDROMORPHONE HCL PF 1 MG/ML IJ SOLN
0.2500 mg | INTRAMUSCULAR | Status: DC | PRN
  Administered 2014-06-22 (×2): 0.5 mg via INTRAVENOUS

## 2014-06-22 MED ORDER — CHLORZOXAZONE 500 MG PO TABS
500.0000 mg | ORAL_TABLET | Freq: Three times a day (TID) | ORAL | Status: DC | PRN
  Filled 2014-06-22: qty 1

## 2014-06-22 MED ORDER — ARTIFICIAL TEARS OP OINT
TOPICAL_OINTMENT | OPHTHALMIC | Status: AC
  Filled 2014-06-22: qty 3.5

## 2014-06-22 MED ORDER — ARTIFICIAL TEARS OP OINT
TOPICAL_OINTMENT | OPHTHALMIC | Status: DC | PRN
  Administered 2014-06-22: 1 via OPHTHALMIC

## 2014-06-22 MED ORDER — SENNA 8.6 MG PO TABS
1.0000 | ORAL_TABLET | Freq: Two times a day (BID) | ORAL | Status: DC
  Administered 2014-06-22 – 2014-06-23 (×2): 8.6 mg via ORAL
  Filled 2014-06-22 (×3): qty 1

## 2014-06-22 MED ORDER — ACETAMINOPHEN 325 MG PO TABS
650.0000 mg | ORAL_TABLET | ORAL | Status: DC | PRN
Start: 2014-06-22 — End: 2014-06-23

## 2014-06-22 MED ORDER — MIDAZOLAM HCL 5 MG/5ML IJ SOLN
INTRAMUSCULAR | Status: DC | PRN
  Administered 2014-06-22: 2 mg via INTRAVENOUS

## 2014-06-22 MED ORDER — POTASSIUM CHLORIDE IN NACL 20-0.9 MEQ/L-% IV SOLN
INTRAVENOUS | Status: DC
  Filled 2014-06-22 (×3): qty 1000

## 2014-06-22 MED ORDER — OXYCODONE HCL 5 MG/5ML PO SOLN: 5.0000 mg | Freq: Once | ORAL | Status: DC | PRN

## 2014-06-22 MED ORDER — PROPOFOL 10 MG/ML IV BOLUS
INTRAVENOUS | Status: DC | PRN
  Administered 2014-06-22: 120 mg via INTRAVENOUS

## 2014-06-22 MED ORDER — MIDAZOLAM HCL 2 MG/2ML IJ SOLN
INTRAMUSCULAR | Status: AC
  Filled 2014-06-22: qty 2

## 2014-06-22 MED ORDER — HEMOSTATIC AGENTS (NO CHARGE) OPTIME
TOPICAL | Status: DC | PRN
  Administered 2014-06-22: 1 via TOPICAL

## 2014-06-22 MED ORDER — HYDROCODONE-ACETAMINOPHEN 5-325 MG PO TABS: 1.0000 | ORAL_TABLET | ORAL | Status: DC | PRN

## 2014-06-22 MED ORDER — OXYCODONE-ACETAMINOPHEN 5-325 MG PO TABS
1.0000 | ORAL_TABLET | ORAL | Status: DC | PRN
  Administered 2014-06-23: 2 via ORAL
  Filled 2014-06-22: qty 2

## 2014-06-22 MED ORDER — MORPHINE SULFATE 2 MG/ML IJ SOLN: 1.0000 mg | INTRAMUSCULAR | Status: DC | PRN

## 2014-06-22 MED ORDER — TRIAMTERENE-HCTZ 75-50 MG PO TABS: 0.5000 | ORAL_TABLET | Freq: Every day | ORAL | Status: DC

## 2014-06-22 MED ORDER — ATENOLOL 50 MG PO TABS
50.0000 mg | ORAL_TABLET | Freq: Two times a day (BID) | ORAL | Status: DC
  Administered 2014-06-22 – 2014-06-23 (×2): 50 mg via ORAL
  Filled 2014-06-22 (×4): qty 1

## 2014-06-22 MED ORDER — SODIUM CHLORIDE 0.9 % IV SOLN
250.0000 mL | INTRAVENOUS | Status: DC
Start: 2014-06-22 — End: 2014-06-23

## 2014-06-22 MED ORDER — 0.9 % SODIUM CHLORIDE (POUR BTL) OPTIME
TOPICAL | Status: DC | PRN
  Administered 2014-06-22: 1000 mL

## 2014-06-22 SURGICAL SUPPLY — 59 items
BAG DECANTER FOR FLEXI CONT (MISCELLANEOUS) ×3 IMPLANT
BENZOIN TINCTURE PRP APPL 2/3 (GAUZE/BANDAGES/DRESSINGS) IMPLANT
BLADE 10 SAFETY STRL DISP (BLADE) IMPLANT
BLADE SURG ROTATE 9660 (MISCELLANEOUS) IMPLANT
BUR MATCHSTICK NEURO 3.0 LAGG (BURR) ×3 IMPLANT
CANISTER SUCT 3000ML (MISCELLANEOUS) ×3 IMPLANT
CLOSURE WOUND 1/2 X4 (GAUZE/BANDAGES/DRESSINGS)
CONT SPEC 4OZ CLIKSEAL STRL BL (MISCELLANEOUS) ×3 IMPLANT
DECANTER SPIKE VIAL GLASS SM (MISCELLANEOUS) ×3 IMPLANT
DERMABOND ADHESIVE PROPEN (GAUZE/BANDAGES/DRESSINGS) ×2
DERMABOND ADVANCED (GAUZE/BANDAGES/DRESSINGS) ×2
DERMABOND ADVANCED .7 DNX12 (GAUZE/BANDAGES/DRESSINGS) ×1 IMPLANT
DERMABOND ADVANCED .7 DNX6 (GAUZE/BANDAGES/DRESSINGS) ×1 IMPLANT
DRAPE LAPAROTOMY 100X72X124 (DRAPES) ×3 IMPLANT
DRAPE MICROSCOPE LEICA (MISCELLANEOUS) ×3 IMPLANT
DRAPE POUCH INSTRU U-SHP 10X18 (DRAPES) ×3 IMPLANT
DRAPE SURG 17X23 STRL (DRAPES) ×3 IMPLANT
DRSG OPSITE POSTOP 4X6 (GAUZE/BANDAGES/DRESSINGS) ×6 IMPLANT
DURAPREP 26ML APPLICATOR (WOUND CARE) ×3 IMPLANT
ELECT REM PT RETURN 9FT ADLT (ELECTROSURGICAL) ×3
ELECTRODE REM PT RTRN 9FT ADLT (ELECTROSURGICAL) ×1 IMPLANT
GAUZE SPONGE 4X4 16PLY XRAY LF (GAUZE/BANDAGES/DRESSINGS) IMPLANT
GLOVE BIO SURGEON STRL SZ 6.5 (GLOVE) ×2 IMPLANT
GLOVE BIO SURGEONS STRL SZ 6.5 (GLOVE) ×1
GLOVE BIOGEL PI IND STRL 6.5 (GLOVE) ×1 IMPLANT
GLOVE BIOGEL PI IND STRL 7.5 (GLOVE) ×2 IMPLANT
GLOVE BIOGEL PI INDICATOR 6.5 (GLOVE) ×2
GLOVE BIOGEL PI INDICATOR 7.5 (GLOVE) ×4
GLOVE ECLIPSE 6.5 STRL STRAW (GLOVE) ×3 IMPLANT
GLOVE EXAM NITRILE LRG STRL (GLOVE) IMPLANT
GLOVE EXAM NITRILE MD LF STRL (GLOVE) IMPLANT
GLOVE EXAM NITRILE XL STR (GLOVE) IMPLANT
GLOVE EXAM NITRILE XS STR PU (GLOVE) IMPLANT
GLOVE SS N UNI LF 7.5 STRL (GLOVE) ×12 IMPLANT
GOWN STRL REUS W/ TWL LRG LVL3 (GOWN DISPOSABLE) ×2 IMPLANT
GOWN STRL REUS W/ TWL XL LVL3 (GOWN DISPOSABLE) ×2 IMPLANT
GOWN STRL REUS W/TWL 2XL LVL3 (GOWN DISPOSABLE) IMPLANT
GOWN STRL REUS W/TWL LRG LVL3 (GOWN DISPOSABLE) ×4
GOWN STRL REUS W/TWL XL LVL3 (GOWN DISPOSABLE) ×4
KIT BASIN OR (CUSTOM PROCEDURE TRAY) ×3 IMPLANT
KIT ROOM TURNOVER OR (KITS) ×3 IMPLANT
NEEDLE HYPO 25X1 1.5 SAFETY (NEEDLE) ×3 IMPLANT
NEEDLE SPNL 18GX3.5 QUINCKE PK (NEEDLE) IMPLANT
NS IRRIG 1000ML POUR BTL (IV SOLUTION) ×3 IMPLANT
PACK LAMINECTOMY NEURO (CUSTOM PROCEDURE TRAY) ×3 IMPLANT
PAD ARMBOARD 7.5X6 YLW CONV (MISCELLANEOUS) ×9 IMPLANT
RUBBERBAND STERILE (MISCELLANEOUS) ×6 IMPLANT
SPONGE GAUZE 4X4 12PLY (GAUZE/BANDAGES/DRESSINGS) IMPLANT
SPONGE LAP 4X18 X RAY DECT (DISPOSABLE) IMPLANT
SPONGE SURGIFOAM ABS GEL SZ50 (HEMOSTASIS) ×3 IMPLANT
STRIP CLOSURE SKIN 1/2X4 (GAUZE/BANDAGES/DRESSINGS) IMPLANT
SUT VIC AB 0 CT1 18XCR BRD8 (SUTURE) ×1 IMPLANT
SUT VIC AB 0 CT1 8-18 (SUTURE) ×2
SUT VIC AB 2-0 CT1 18 (SUTURE) ×3 IMPLANT
SUT VIC AB 3-0 SH 8-18 (SUTURE) ×3 IMPLANT
SYR 20ML ECCENTRIC (SYRINGE) ×3 IMPLANT
TOWEL OR 17X24 6PK STRL BLUE (TOWEL DISPOSABLE) ×3 IMPLANT
TOWEL OR 17X26 10 PK STRL BLUE (TOWEL DISPOSABLE) ×3 IMPLANT
WATER STERILE IRR 1000ML POUR (IV SOLUTION) ×3 IMPLANT

## 2014-06-22 NOTE — Anesthesia Postprocedure Evaluation (Signed)
  Anesthesia Post-op Note  Patient: Jeremy Rivera  Procedure(s) Performed: Procedure(s) with comments: LUMBAR LAMINECTOMY/DECOMPRESSION MICRODISCECTOMY 1 LEVEL (Left) - Redo Left Lumbar Three-Four Microdiskectomy  Patient Location: PACU  Anesthesia Type:General  Level of Consciousness: awake, alert , oriented and patient cooperative  Airway and Oxygen Therapy: Patient Spontanous Breathing and Patient connected to nasal cannula oxygen  Post-op Pain: mild  Post-op Assessment: Post-op Vital signs reviewed, Patient's Cardiovascular Status Stable, Respiratory Function Stable, Patent Airway, No signs of Nausea or vomiting and Pain level controlled  Post-op Vital Signs: Reviewed and stable  Last Vitals:  Filed Vitals:   06/22/14 1730  BP: 165/95  Pulse: 73  Temp:   Resp: 20    Complications: No apparent anesthesia complications

## 2014-06-22 NOTE — Plan of Care (Signed)
Problem: Consults Goal: Diagnosis - Spinal Surgery Outcome: Completed/Met Date Met:  06/22/14 Laminectomy/Microdiscectomy     

## 2014-06-22 NOTE — H&P (Signed)
BP 187/106  Pulse 71  Temp(Src) 98.3 F (36.8 C) (Oral)  Resp 20  SpO2 98% HISTORY OF PRESENT ILLNESS: Mr. Leda MinRonnie Therien is a 63 year old retired gentleman whom for approximately in the last five weeks has had significant amount of pain in his left lower extremity and he has also noticed some weakness in the left lower extremity. He said if there is anything unusual, it may have been the amount of yard work he was doing before the onset of the pain, he did not do any single thing, which is unusual, but the accumulation of all the activity may have been just a little bit much. Nevertheless, he had to use a cane now for about the last month just to steady himself. He has to use his leg for example when he is getting into car to lift up the left leg and bring it into the car. He has had no bowel or bladder dysfunction. He has had very poor sleep over this period of time secondary to the pain. He is right handed. REVIEW OF SYSTEMS: Remarkable for hypertension, leg pain at rest and with walking, back pain, arthritis, and diabetes. He denies constitutional, eye, ear, nose, throat, and mouth, respiratory, gastrointestinal, genitourinary, skin, neurological, psychiatric, hematologic, and allergic problems. On his pain chart, he lists a band of pain across his lower back and into the left lower extremity extending into the leg. PAST MEDICAL HISTORY:   Current Medical Conditions: Significant for hypertension and diabetes. He in his own words has had back pain, leg pain, and weakness in the left lower extremity.  Prior Operations: He has undergone surgery in 1994 by Dr. Corlis HoveStephen Robinson [place] in [place].  Medications and Allergies: He has no known drug allergies. Currently, he is taking aspirin and no medications. FAMILY HISTORY: Mother and father both deceased. SOCIAL HISTORY: He does not smoke. He does not use alcohol. He does not use illicit drugs. PHYSICAL EXAMINATION: Vital signs: Height is 71  inches, weight 258 pounds, BMI is 35.98. Blood pressure 184/99, pulse is 76, temperature is 97.8. On examination, he is alert and oriented x4 and answering all questions appropriately. Memory, language, attention span and fund of knowledge are normal. He is well kempt and in minor distress. He is using a cane and had some mild antalgia when he walks favoring the left lower extremity. He does have weakness in the left hip flexors and quadriceps 4/5 on manual examination. Pupils are equal, round, and reactive to light. Full extraocular movements. Full visual fields. Hearing intact to voice. Symmetric facial sensation and movement. Uvula elevates in the midline. Shoulder shrug is normal. Tongue protrudes in the midline. Reflexes 2+ in the upper extremity, not elicited at the knees or ankles. Intact proprioception. Romberg test is negative. Muscle, tone, bulk, and coordination are otherwise normal. DATA: MRI performed of the lumbar spine shows a very large disc herniation at L3-4 eccentric to the left side. The clonus and cauda equina are otherwise normal. Alignment is normal. The laminectomy defect can be seen on the left side on the MRI at L3-4. The disc is markedly degenerated, certainly dessicated and there is loss of disc space height. Rest of the back shows spondylotic change, but this is obviously the problem, which is causing his symptoms today. DIAGNOSES: 1. Displaced disc left L3-4. 2. Left L3, left L4 radiculopathy. PLAN: I have discussed at length with [name] the options that he has. I believe secondary to his weakness that his very best option  should be surgery and operative decompression. [name] has asked a number of questions. He also noticed that the only time it is too late to change his mind is after he is put to sleep outside of that he can always change his mind and defer or ask for any treatment he is so desired. I don't believe physical therapy or injection would be of much benefit only  because he is so weak at this point and the disc herniation is quite large. We will tentatively plan for surgery some time next week probably Wednesday. If there is any change he will let me know or I will let him know.

## 2014-06-22 NOTE — Op Note (Signed)
06/22/2014  4:31 PM  PATIENT:  Jeremy Rivera  63 y.o. 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 He was positioned prone on a Wilson frame with all pressure points padded. His back was prepped and draped in a sterile manner. I opened the skin with a 10 blade and carried the dissection down to the thoracolumbar fascia. I used both sharp dissection and the monopolar cautery to expose the lamina of L3, and L4. I confirmed my location with an intraoperative xray.  I used the drill, Kerrison punches, and curettes to perform a semihemilaminectomy of L3. I used the punches to remove the ligamentum flavum to expose the thecal sac. I brought the microscope into the operative field and with I started our decompression of the spinal canal, thecal sac and L4 root(s). I cauterized epidural veins overlying the disc space then divided them sharply. I opened the disc space with a 15 blade and proceeded with the discectomy. I used pituitary rongeurs, curettes, and other instruments to remove disc material.  There was a large fragment lodged just at the take off of the L4 roots which was quite stuck due to the mass. After the discectomy was completed I  inspected the L4 nerve root and felt it was well decompressed. I explored rostrally, laterally, medially, and caudally and was satisfied with the decompression. I irrigated the wound, then  closed in layers. I approximated the thoracolumbar fascia, subcutaneous, and subcuticular planes with vicryl sutures. I used dermabond for a sterile dressing.   PLAN OF CARE: Admit to inpatient   PATIENT DISPOSITION:  PACU - hemodynamically stable.   Delay start of Pharmacological VTE agent (>24hrs) due to surgical blood loss or risk of bleeding:  yes

## 2014-06-22 NOTE — Anesthesia Preprocedure Evaluation (Addendum)
Anesthesia Evaluation  Patient identified by MRN, date of birth, ID band Patient awake    Reviewed: Allergy & Precautions, H&P , NPO status , Patient's Chart, lab work & pertinent test results, reviewed documented beta blocker date and time   Airway Mallampati: II TM Distance: >3 FB Neck ROM: Full    Dental  (+) Dental Advisory Given, Teeth Intact   Pulmonary          Cardiovascular hypertension, Pt. on medications and Pt. on home beta blockers     Neuro/Psych    GI/Hepatic   Endo/Other  diabetes, Type 2, Oral Hypoglycemic AgentsMorbid obesity  Renal/GU      Musculoskeletal   Abdominal   Peds  Hematology   Anesthesia Other Findings   Reproductive/Obstetrics                         Anesthesia Physical Anesthesia Plan  ASA: II  Anesthesia Plan: General   Post-op Pain Management:    Induction: Intravenous  Airway Management Planned: Oral ETT  Additional Equipment:   Intra-op Plan:   Post-operative Plan: Extubation in OR  Informed Consent: I have reviewed the patients History and Physical, chart, labs and discussed the procedure including the risks, benefits and alternatives for the proposed anesthesia with the patient or authorized representative who has indicated his/her understanding and acceptance.   Dental advisory given  Plan Discussed with: CRNA and Surgeon  Anesthesia Plan Comments:        Anesthesia Quick Evaluation

## 2014-06-22 NOTE — Anesthesia Procedure Notes (Signed)
Procedure Name: Intubation Date/Time: 06/22/2014 2:16 PM Performed by: Vita BarleyURNER, Girtrude Enslin E Pre-anesthesia Checklist: Patient identified, Emergency Drugs available, Suction available and Patient being monitored Patient Re-evaluated:Patient Re-evaluated prior to inductionOxygen Delivery Method: Circle system utilized Preoxygenation: Pre-oxygenation with 100% oxygen Intubation Type: IV induction Ventilation: Mask ventilation without difficulty and Oral airway inserted - appropriate to patient size Laryngoscope Size: Hyacinth MeekerMiller and 2 Grade View: Grade I Tube type: Oral Tube size: 7.5 mm Number of attempts: 1 Airway Equipment and Method: Stylet and Oral airway Placement Confirmation: ETT inserted through vocal cords under direct vision,  positive ETCO2 and breath sounds checked- equal and bilateral Secured at: 23 cm Tube secured with: Tape Dental Injury: Teeth and Oropharynx as per pre-operative assessment

## 2014-06-22 NOTE — Transfer of Care (Signed)
Immediate Anesthesia Transfer of Care Note  Patient: Jeremy Rivera  Procedure(s) Performed: Procedure(s) with comments: LUMBAR LAMINECTOMY/DECOMPRESSION MICRODISCECTOMY 1 LEVEL (Left) - Redo Left Lumbar Three-Four Microdiskectomy  Patient Location: PACU  Anesthesia Type:General  Level of Consciousness: awake, alert  and oriented  Airway & Oxygen Therapy: Patient Spontanous Breathing and Patient connected to nasal cannula oxygen  Post-op Assessment: Report given to PACU RN and Post -op Vital signs reviewed and stable  Post vital signs: Reviewed and stable  Complications: No apparent anesthesia complications

## 2014-06-23 ENCOUNTER — Encounter (HOSPITAL_COMMUNITY): Payer: Self-pay | Admitting: Neurosurgery

## 2014-06-23 DIAGNOSIS — M5126 Other intervertebral disc displacement, lumbar region: Secondary | ICD-10-CM | POA: Diagnosis not present

## 2014-06-23 LAB — GLUCOSE, CAPILLARY
GLUCOSE-CAPILLARY: 142 mg/dL — AB (ref 70–99)
Glucose-Capillary: 142 mg/dL — ABNORMAL HIGH (ref 70–99)

## 2014-06-23 MED ORDER — TRIAMTERENE-HCTZ 37.5-25 MG PO TABS
1.0000 | ORAL_TABLET | Freq: Every day | ORAL | Status: DC
  Administered 2014-06-23: 1 via ORAL
  Filled 2014-06-23: qty 1

## 2014-06-23 NOTE — Discharge Instructions (Addendum)

## 2014-06-23 NOTE — Progress Notes (Signed)
Pt. Alert and oriented, follows simple instructions, denies pain. Incision area without swelling, redness or S/S of infection. Voiding adequate clear yellow urine. Moving all extremities well and vitals stable and documented. Anterior Cervical Fusion surgery notes instructions given to patient and family member for home safety and precautions. Patient discharged home with family. Pt. and family stated understanding of instructions given. Patient is given instructions on how to remove catheter, if urologist inform patient to do so.

## 2014-06-23 NOTE — Anesthesia Postprocedure Evaluation (Signed)
Anesthesia Post Note  Patient: Jeremy Rivera  Procedure(s) Performed: Procedure(s) (LRB): LUMBAR LAMINECTOMY/DECOMPRESSION MICRODISCECTOMY 1 LEVEL (Left)  Anesthesia type: general  Patient location: PACU  Post pain: Pain level controlled  Post assessment: Patient's Cardiovascular Status Stable  Last Vitals:  Filed Vitals:   06/23/14 0759  BP: 144/78  Pulse: 80  Temp: 37.1 C  Resp: 18    Post vital signs: Reviewed and stable  Level of consciousness: sedated  Complications: No apparent anesthesia complications

## 2014-06-24 ENCOUNTER — Telehealth: Payer: Self-pay | Admitting: Family Medicine

## 2014-06-24 NOTE — Telephone Encounter (Signed)
Patient needs handicap placard signed.

## 2014-06-24 NOTE — Telephone Encounter (Signed)
Chart in your pile 

## 2014-07-18 ENCOUNTER — Telehealth: Payer: Self-pay | Admitting: Family Medicine

## 2014-07-18 MED ORDER — FINASTERIDE 5 MG PO TABS: 5.0000 mg | ORAL_TABLET | Freq: Every day | ORAL | Status: DC

## 2014-07-18 MED ORDER — TAMSULOSIN HCL 0.4 MG PO CAPS: 0.4000 mg | ORAL_CAPSULE | Freq: Every day | ORAL | Status: DC

## 2014-07-18 NOTE — Telephone Encounter (Signed)
Patient notified and verbalized understanding. 

## 2014-07-18 NOTE — Telephone Encounter (Signed)
Start prosc ar 5 mg daily 30 6 ref. Takes a month plus to gradually take effect

## 2014-07-18 NOTE — Telephone Encounter (Signed)
Apparently, patient is confused. He is now wanting something to help control his bladder at night, and he does not want the Flomax.

## 2014-07-18 NOTE — Telephone Encounter (Signed)
Ntsw, name and dose of med?, why did dr cabell stop? We can resume without o v if warranted

## 2014-07-18 NOTE — Telephone Encounter (Signed)
Patient notified and very thankful.

## 2014-07-18 NOTE — Telephone Encounter (Signed)
Tamsulosin 0.4mg   Take 1 capsule 30 mins following the same meal daily.  His last dose was last Tuesday.  Dr. Franky Machoabbell told him to stop the med b/c he thought it was contributing to his overactive bladder, but the pt said that he is able to control his bladder during the day, but not at night.

## 2014-07-18 NOTE — Telephone Encounter (Signed)
Ok resume with 6 mo ref

## 2014-07-18 NOTE — Telephone Encounter (Signed)
Patient says there was some confusion somewhere and that he did not want the Tamsulosin called in because Dr. Mikal Planeabell said that it was probably contributing to his urinary leakage. He would like something called in to control his bladder.   [facility]

## 2014-07-18 NOTE — Telephone Encounter (Signed)
Dr. Franky Machoabbell, who did surgery on patients back last month, had once prescribed something for patients overactive bladder. At his follow-up visit with him, he told him to stop the medicine. He said he is still having this issue and wants to know if Dr. Brett CanalesSteve can start prescribing this for him, and if he needs appointment, he would like to come in today.    [facility]

## 2014-07-22 ENCOUNTER — Other Ambulatory Visit: Payer: Self-pay | Admitting: Family Medicine

## 2014-08-02 DIAGNOSIS — R972 Elevated prostate specific antigen [PSA]: Secondary | ICD-10-CM | POA: Diagnosis not present

## 2014-08-02 DIAGNOSIS — N401 Enlarged prostate with lower urinary tract symptoms: Secondary | ICD-10-CM | POA: Diagnosis not present

## 2014-08-02 DIAGNOSIS — N139 Obstructive and reflux uropathy, unspecified: Secondary | ICD-10-CM | POA: Diagnosis not present

## 2014-08-04 DIAGNOSIS — Z0289 Encounter for other administrative examinations: Secondary | ICD-10-CM

## 2014-08-09 DIAGNOSIS — R972 Elevated prostate specific antigen [PSA]: Secondary | ICD-10-CM | POA: Diagnosis not present

## 2014-08-09 DIAGNOSIS — N401 Enlarged prostate with lower urinary tract symptoms: Secondary | ICD-10-CM | POA: Diagnosis not present

## 2014-08-09 DIAGNOSIS — R351 Nocturia: Secondary | ICD-10-CM | POA: Diagnosis not present

## 2014-08-15 ENCOUNTER — Other Ambulatory Visit: Payer: Self-pay | Admitting: Urology

## 2014-08-15 DIAGNOSIS — R972 Elevated prostate specific antigen [PSA]: Secondary | ICD-10-CM

## 2014-08-25 ENCOUNTER — Ambulatory Visit (HOSPITAL_COMMUNITY)
Admission: RE | Admit: 2014-08-25 | Discharge: 2014-08-25 | Disposition: A | Discharge status: Home / Self Care | Payer: Medicare Other | Source: Ambulatory Visit | Attending: Urology | Admitting: Urology

## 2014-08-25 DIAGNOSIS — R972 Elevated prostate specific antigen [PSA]: Secondary | ICD-10-CM | POA: Insufficient documentation

## 2014-08-25 DIAGNOSIS — N400 Enlarged prostate without lower urinary tract symptoms: Secondary | ICD-10-CM | POA: Insufficient documentation

## 2014-08-25 LAB — POCT I-STAT, CHEM 8
BUN: 13 mg/dL (ref 6–23)
CHLORIDE: 97 meq/L (ref 96–112)
Calcium, Ion: 1.14 mmol/L (ref 1.13–1.30)
Creatinine, Ser: 0.8 mg/dL (ref 0.50–1.35)
Glucose, Bld: 136 mg/dL — ABNORMAL HIGH (ref 70–99)
HEMATOCRIT: 43 % (ref 39.0–52.0)
HEMOGLOBIN: 14.6 g/dL (ref 13.0–17.0)
Potassium: 3.9 mEq/L (ref 3.7–5.3)
Sodium: 138 mEq/L (ref 137–147)
TCO2: 32 mmol/L (ref 0–100)

## 2014-08-25 MED ORDER — GADOBENATE DIMEGLUMINE 529 MG/ML IV SOLN
20.0000 mL | Freq: Once | INTRAVENOUS | Status: AC | PRN
  Administered 2014-08-25: 20 mL via INTRAVENOUS

## 2014-09-01 ENCOUNTER — Encounter: Payer: Self-pay | Admitting: Gastroenterology

## 2014-09-17 ENCOUNTER — Other Ambulatory Visit: Payer: Self-pay | Admitting: Family Medicine

## 2014-09-19 IMAGING — CR DG CHEST 2V
2 series · 2 of 2 positions shown · non-contrast
Comparison: None.

CLINICAL DATA: 63-year-old male preoperative study for back
surgery. Hypertension and diabetes. Initial encounter.

EXAM:
CHEST  2 VIEW

[w chest pa]
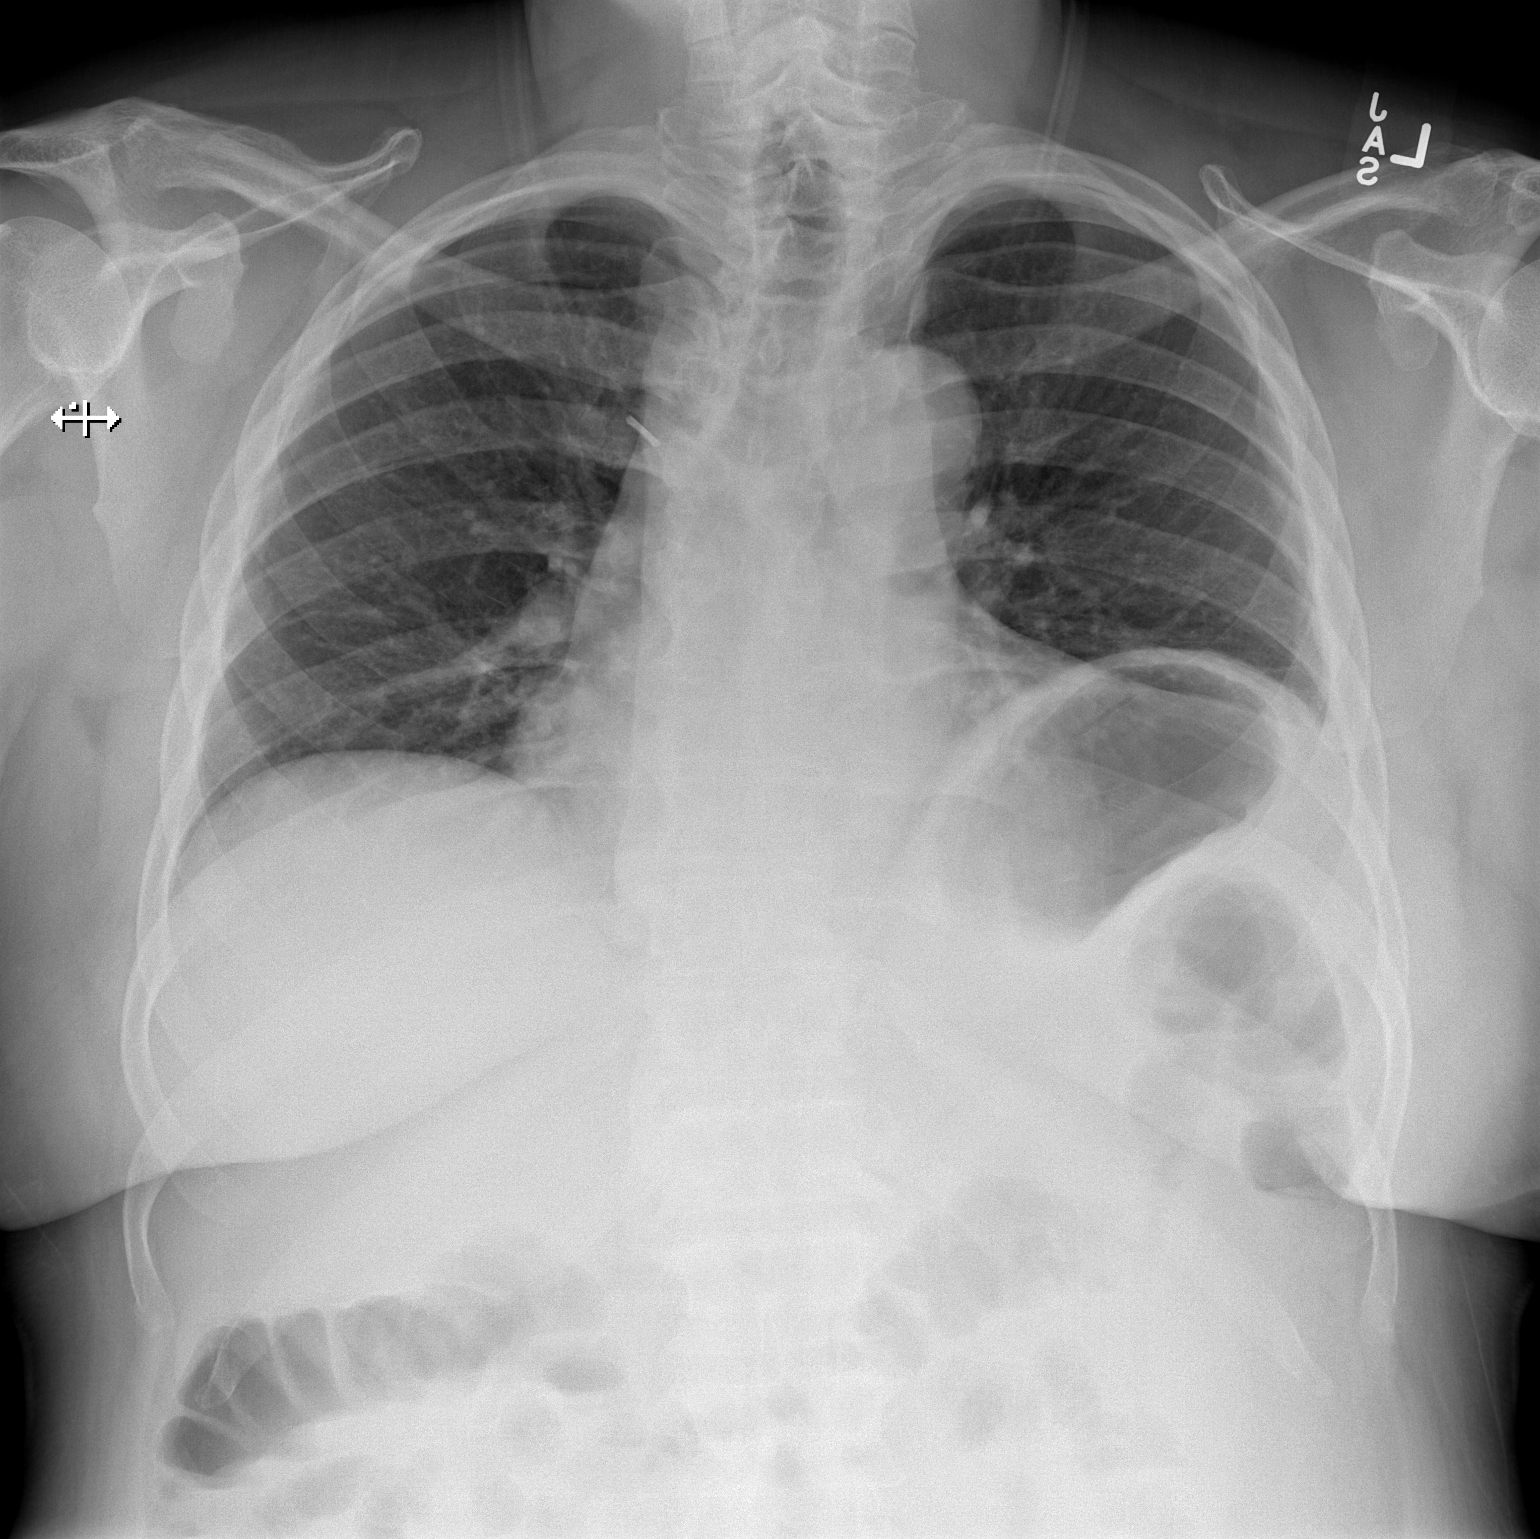

[w chest lat]
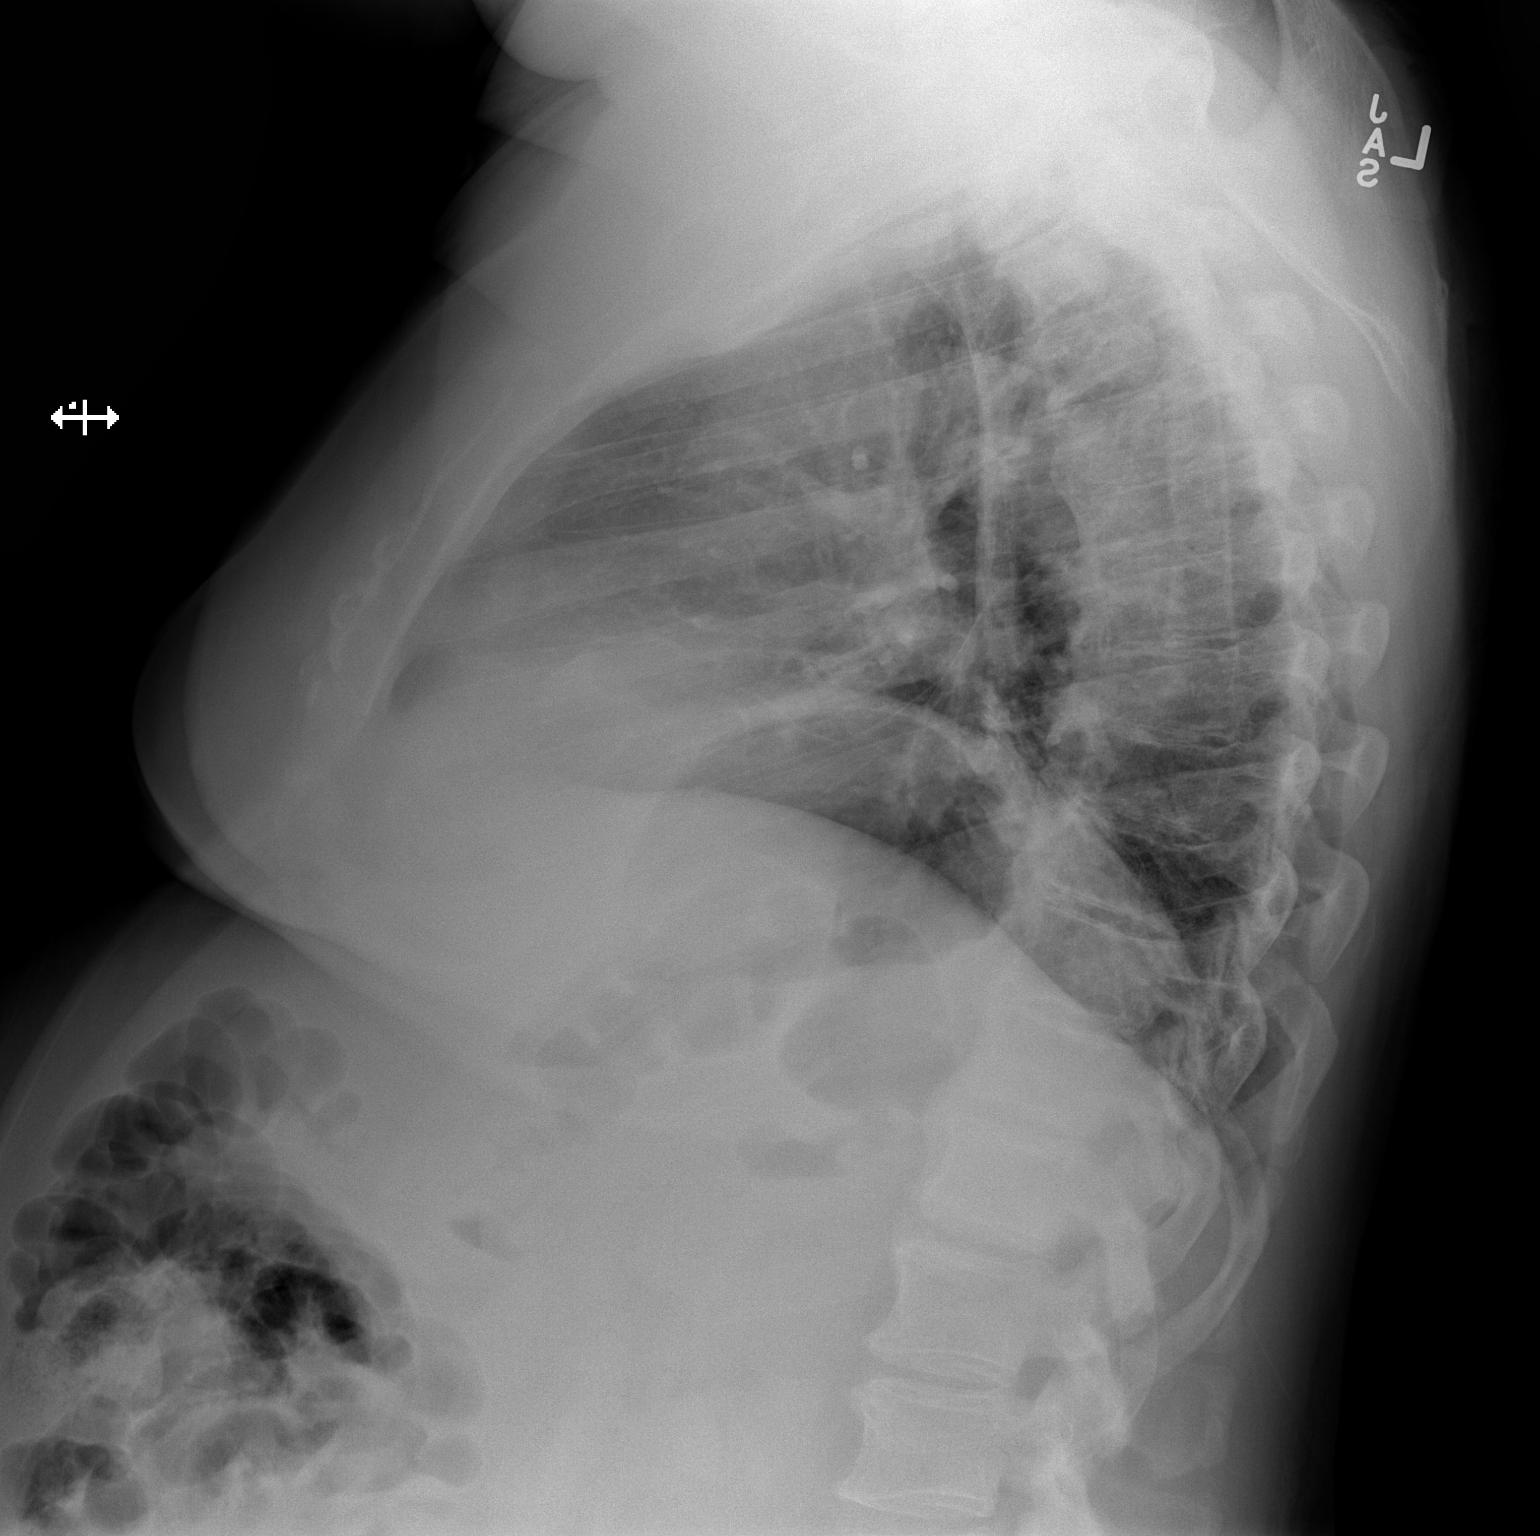

[2 of 2 positions shown; findings below may reference images not displayed]

FINDINGS: Low lung volumes. Crowding of markings at both lung bases. Surgical
clip projecting along the right peritracheal contour on the frontal
view. Normal cardiac size and mediastinal contours. Visualized
tracheal air column is within normal limits. No pneumothorax or
pulmonary edema. No pleural effusion or consolidation. Flowing
osteophytes in the thoracic spine. No acute osseous abnormality
identified.
IMPRESSION: Low lung volumes, otherwise no acute cardiopulmonary abnormality.

## 2014-09-20 DIAGNOSIS — R972 Elevated prostate specific antigen [PSA]: Secondary | ICD-10-CM | POA: Diagnosis not present

## 2014-09-20 DIAGNOSIS — N139 Obstructive and reflux uropathy, unspecified: Secondary | ICD-10-CM | POA: Diagnosis not present

## 2014-09-20 DIAGNOSIS — N401 Enlarged prostate with lower urinary tract symptoms: Secondary | ICD-10-CM | POA: Diagnosis not present

## 2014-09-21 IMAGING — CR DG LUMBAR SPINE 2-3V
1 series · 1 of 1 positions shown · non-contrast
Comparison: Outside MRI of the lumbar spine from [REDACTED] dated 06/07/2014, available on [HOSPITAL] PACS

CLINICAL DATA: 63-year-old male undergoing lumbar spine surgery.
Initial encounter.

EXAM:
LUMBAR SPINE - 2-3 VIEW

[lat]
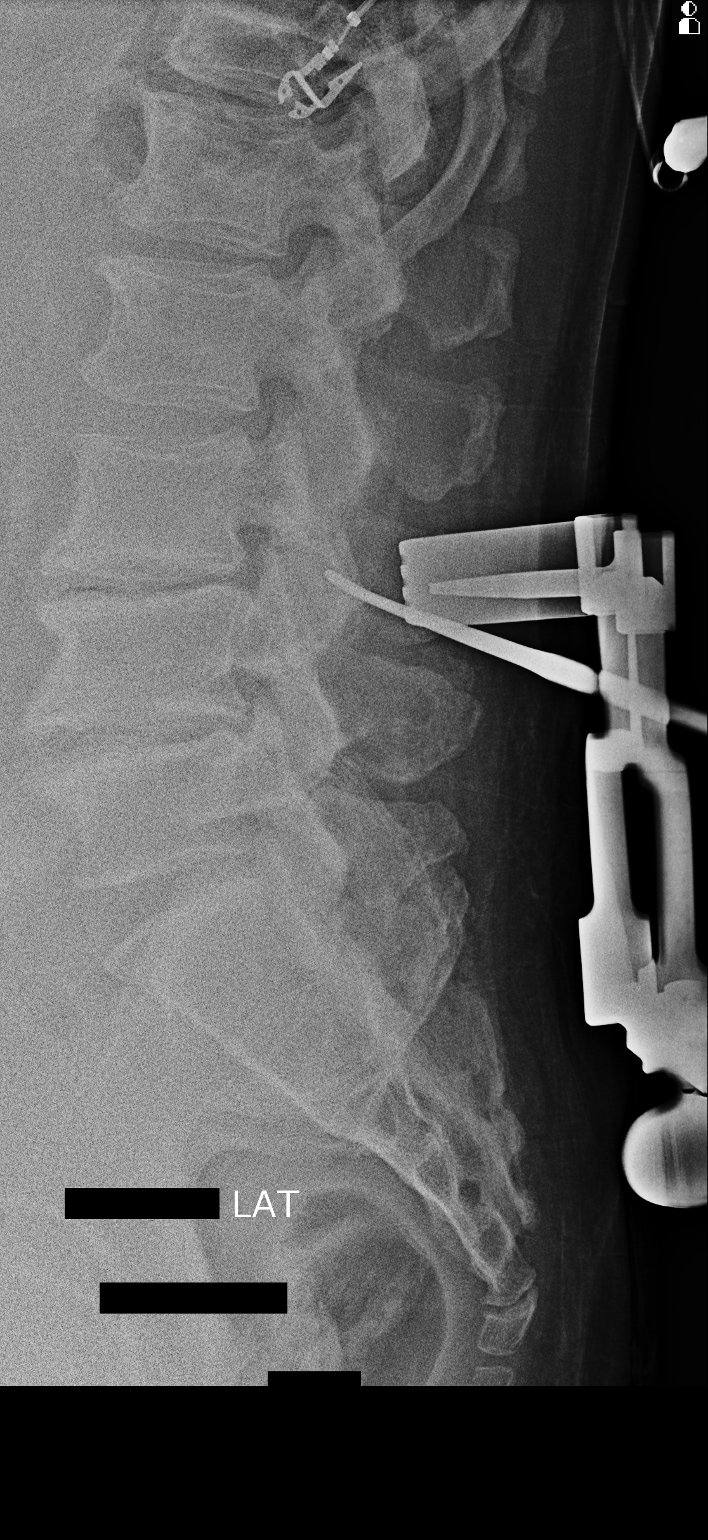

[1 of 1 positions shown; findings below may reference images not displayed]

FINDINGS: Two Intraoperative portable cross-table lateral views of the lumbar
spine.

Assuming the lowest open disc space is L5-S1, then the L3-L4 disc
space is localized on both of these intraoperative images including
that at 3481 hr an that at 7317 hrs.
IMPRESSION: Intraoperative localization at L3-L4 assuming normal lumbar
segmentation.

## 2014-10-03 ENCOUNTER — Encounter: Payer: Self-pay | Admitting: Gastroenterology

## 2014-10-18 ENCOUNTER — Other Ambulatory Visit: Payer: Self-pay | Admitting: Family Medicine

## 2014-11-22 ENCOUNTER — Encounter: Payer: Self-pay | Admitting: Gastroenterology

## 2014-12-12 ENCOUNTER — Ambulatory Visit (INDEPENDENT_AMBULATORY_CARE_PROVIDER_SITE_OTHER): Payer: Medicare Other | Admitting: Family Medicine

## 2014-12-12 ENCOUNTER — Encounter: Payer: Self-pay | Admitting: Family Medicine

## 2014-12-12 VITALS — BP 142/98 | Ht 70.0 in | Wt 260.0 lb

## 2014-12-12 DIAGNOSIS — Z79899 Other long term (current) drug therapy: Secondary | ICD-10-CM

## 2014-12-12 DIAGNOSIS — Z0000 Encounter for general adult medical examination without abnormal findings: Secondary | ICD-10-CM | POA: Diagnosis not present

## 2014-12-12 DIAGNOSIS — Z23 Encounter for immunization: Secondary | ICD-10-CM | POA: Diagnosis not present

## 2014-12-12 DIAGNOSIS — E119 Type 2 diabetes mellitus without complications: Secondary | ICD-10-CM | POA: Diagnosis not present

## 2014-12-12 DIAGNOSIS — E785 Hyperlipidemia, unspecified: Secondary | ICD-10-CM | POA: Diagnosis not present

## 2014-12-12 NOTE — Progress Notes (Signed)
Subjective:    Patient ID: Jeremy Rivera, male    DOB: 12/31/1950, 63 y.o.   MRN: [id number]  HPI AWV- Annual Wellness Visit  The patient was seen for their annual wellness visit. The patient's past medical history, surgical history, and family history were reviewed. Pertinent vaccines were reviewed ( tetanus, pneumonia, shingles, flu) The patient's medication list was reviewed and updated.  The height and weight were entered. The patient's current BMI is: 37.31  Cognitive screening was completed. Outcome of Mini - Cog: pass  Falls within the past 6 months: none  Current tobacco usage: none (All patients who use tobacco were given written and verbal information on quitting)  Recent listing of emergency department/hospitalizations over the past year were reviewed.  current specialist the patient sees on a regular basis: Dr. Marcello Fennelannebaum for prostate   Medicare annual wellness visit patient questionnaire was reviewed.  A written screening schedule for the patient for the next 5-10 years was given. Appropriate discussion of followup regarding next visit was discussed.  Concerns about left hand stiffness.   Results for orders placed or performed during the hospital encounter of 08/25/14  I-STAT, chem 8  Result Value Ref Range   Sodium 138 137 - 147 mEq/L   Potassium 3.9 3.7 - 5.3 mEq/L   Chloride 97 96 - 112 mEq/L   BUN 13 6 - 23 mg/dL   Creatinine, Ser 7.820.80 0.50 - 1.35 mg/dL   Glucose, Bld 956136 (H) 70 - 99 mg/dL   Calcium, Ion 2.131.14 0.861.13 - 1.30 mmol/L   TCO2 32 0 - 100 mmol/L   Hemoglobin 14.6 13.0 - 17.0 g/dL   HCT 57.843.0 46.939.0 - 62.952.0 %   Morning sugars 110 to 120s  Low 120s and teens.  Sees d tannenbaum regularly for elevated psa  Review of Systems  Constitutional: Negative for fever, activity change and appetite change.       R some fatigue at times  HENT: Negative for congestion and rhinorrhea.   Eyes: Negative for discharge.  Respiratory: Negative for cough and  wheezing.   Cardiovascular: Negative for chest pain.  Gastrointestinal: Negative for vomiting, abdominal pain and blood in stool.  Genitourinary: Negative for frequency and difficulty urinating.       History elevated PSA  Musculoskeletal: Negative for neck pain.       Intermittent left hand stiffness. Worse first thing in the morning.  Skin: Negative for rash.  Allergic/Immunologic: Negative for environmental allergies and food allergies.  Neurological: Negative for weakness and headaches.  Psychiatric/Behavioral: Negative for agitation.  All other systems reviewed and are negative.      Objective:   Physical Exam  Constitutional: He appears well-developed and well-nourished.  HENT:  Head: Normocephalic and atraumatic.  Right Ear: External ear normal.  Left Ear: External ear normal.  Nose: Nose normal.  Mouth/Throat: Oropharynx is clear and moist.  Eyes: EOM are normal. Pupils are equal, round, and reactive to light.  Neck: Normal range of motion. Neck supple. No thyromegaly present.  Cardiovascular: Normal rate, regular rhythm and normal heart sounds.   No murmur heard. Pulmonary/Chest: Effort normal and breath sounds normal. No respiratory distress. He has no wheezes.  Abdominal: Soft. Bowel sounds are normal. He exhibits no distension and no mass. There is no tenderness.  Genitourinary: Penis normal.  Musculoskeletal: Normal range of motion. He exhibits no edema.  Lymphadenopathy:    He has no cervical adenopathy.  Neurological: He is alert. He exhibits normal muscle tone.  Skin: Skin is warm and dry. No erythema.  Psychiatric: He has a normal mood and affect. His behavior is normal. Judgment normal.  Vitals reviewed.         Assessment & Plan:  Impression 1 wellness exam #2 type 2 diabetes exact control uncertain #3 hyperlipidemia control uncertain plan diet discussed. Exercise discussed. Up-to-date on colonoscopy. Appropriate blood work. Medications refilled.  Further recommendations based on results. WSL

## 2015-01-03 DIAGNOSIS — E119 Type 2 diabetes mellitus without complications: Secondary | ICD-10-CM | POA: Diagnosis not present

## 2015-01-03 DIAGNOSIS — E785 Hyperlipidemia, unspecified: Secondary | ICD-10-CM | POA: Diagnosis not present

## 2015-01-03 DIAGNOSIS — Z79899 Other long term (current) drug therapy: Secondary | ICD-10-CM | POA: Diagnosis not present

## 2015-01-04 LAB — HEPATIC FUNCTION PANEL
ALT: 16 U/L (ref 0–53)
AST: 15 U/L (ref 0–37)
Albumin: 4.3 g/dL (ref 3.5–5.2)
Alkaline Phosphatase: 69 U/L (ref 39–117)
Bilirubin, Direct: 0.1 mg/dL (ref 0.0–0.3)
Indirect Bilirubin: 0.4 mg/dL (ref 0.2–1.2)
TOTAL PROTEIN: 6.9 g/dL (ref 6.0–8.3)
Total Bilirubin: 0.5 mg/dL (ref 0.2–1.2)

## 2015-01-04 LAB — BASIC METABOLIC PANEL
BUN: 14 mg/dL (ref 6–23)
CHLORIDE: 95 meq/L — AB (ref 96–112)
CO2: 32 meq/L (ref 19–32)
CREATININE: 1.04 mg/dL (ref 0.50–1.35)
Calcium: 8.9 mg/dL (ref 8.4–10.5)
GLUCOSE: 110 mg/dL — AB (ref 70–99)
POTASSIUM: 4 meq/L (ref 3.5–5.3)
Sodium: 137 mEq/L (ref 135–145)

## 2015-01-04 LAB — LIPID PANEL
CHOLESTEROL: 197 mg/dL (ref 0–200)
HDL: 39 mg/dL — AB (ref >39)
LDL Cholesterol: 125 mg/dL — ABNORMAL HIGH (ref 0–99)
Total CHOL/HDL Ratio: 5.1 Ratio
Triglycerides: 166 mg/dL — ABNORMAL HIGH (ref <150)
VLDL: 33 mg/dL (ref 0–40)

## 2015-01-04 LAB — MICROALBUMIN, URINE: Microalb, Ur: 3.2 mg/dL — ABNORMAL HIGH (ref <2.0)

## 2015-01-04 LAB — HEMOGLOBIN A1C
HEMOGLOBIN A1C: 7.5 % — AB (ref <5.7)
Mean Plasma Glucose: 169 mg/dL — ABNORMAL HIGH (ref <117)

## 2015-01-11 ENCOUNTER — Encounter: Payer: Self-pay | Admitting: Family Medicine

## 2015-01-11 ENCOUNTER — Ambulatory Visit: Payer: Medicare Other

## 2015-01-11 VITALS — Ht 71.0 in | Wt 265.0 lb

## 2015-01-11 DIAGNOSIS — Z860100 Personal history of colon polyps, unspecified: Secondary | ICD-10-CM

## 2015-01-11 MED ORDER — SUPREP BOWEL PREP KIT 17.5-3.13-1.6 GM/177ML PO SOLN: 1.0000 | Freq: Once | ORAL | Status: DC

## 2015-01-11 NOTE — Progress Notes (Signed)
No allergies to eggs or soy No diet/weight loss meds No home oxygen No past problems with anesthesia  Has email  Emmi instructions given for colonoscopy 

## 2015-01-17 ENCOUNTER — Telehealth: Payer: Self-pay | Admitting: Gastroenterology

## 2015-01-17 NOTE — Telephone Encounter (Signed)
Spoke with pt.Due to the cost of the Suprep bowel prep,Miralax split dose prep was discussed with pt.I will mail the new instructions for the Miralax prep to the pt and he will call if he has any further questions.I did review instructions over the phone with pt.

## 2015-01-18 ENCOUNTER — Other Ambulatory Visit: Payer: Self-pay | Admitting: Family Medicine

## 2015-01-19 ENCOUNTER — Telehealth: Payer: Self-pay | Admitting: Gastroenterology

## 2015-01-23 ENCOUNTER — Other Ambulatory Visit: Payer: Self-pay | Admitting: Family Medicine

## 2015-01-25 ENCOUNTER — Encounter: Payer: Self-pay | Admitting: Gastroenterology

## 2015-01-25 ENCOUNTER — Ambulatory Visit (AMBULATORY_SURGERY_CENTER): Payer: Medicare Other | Admitting: Gastroenterology

## 2015-01-25 VITALS — BP 167/104 | HR 67 | Temp 97.9°F | Resp 22 | Ht 71.0 in | Wt 265.0 lb

## 2015-01-25 DIAGNOSIS — Z86010 Personal history of colonic polyps: Secondary | ICD-10-CM

## 2015-01-25 DIAGNOSIS — E119 Type 2 diabetes mellitus without complications: Secondary | ICD-10-CM | POA: Diagnosis not present

## 2015-01-25 DIAGNOSIS — I10 Essential (primary) hypertension: Secondary | ICD-10-CM | POA: Diagnosis not present

## 2015-01-25 LAB — GLUCOSE, CAPILLARY
GLUCOSE-CAPILLARY: 94 mg/dL (ref 70–99)
Glucose-Capillary: 109 mg/dL — ABNORMAL HIGH (ref 70–99)

## 2015-01-25 MED ORDER — SODIUM CHLORIDE 0.9 % IV SOLN
500.0000 mL | INTRAVENOUS | Status: DC
Start: 2015-01-25 — End: 2015-01-26

## 2015-01-25 NOTE — Patient Instructions (Signed)
YOU HAD AN ENDOSCOPIC PROCEDURE TODAY AT THE [facility]: Refer to the procedure report that was given to you for any specific questions about what was found during the examination.  If the procedure report does not answer your questions, please call your gastroenterologist to clarify.  If you requested that your care partner not be given the details of your procedure findings, then the procedure report has been included in a sealed envelope for you to review at your convenience later.  YOU SHOULD EXPECT: Some feelings of bloating in the abdomen. Passage of more gas than usual.  Walking can help get rid of the air that was put into your GI tract during the procedure and reduce the bloating. If you had a lower endoscopy (such as a colonoscopy or flexible sigmoidoscopy) you may notice spotting of blood in your stool or on the toilet paper. If you underwent a bowel prep for your procedure, then you may not have a normal bowel movement for a few days.  DIET: Your first meal following the procedure should be a light meal and then it is ok to progress to your normal diet.  A half-sandwich or bowl of soup is an example of a good first meal.  Heavy or fried foods are harder to digest and may make you feel nauseous or bloated.  Likewise meals heavy in dairy and vegetables can cause extra gas to form and this can also increase the bloating.  Drink plenty of fluids but you should avoid alcoholic beverages for 24 hours.  ACTIVITY: Your care partner should take you home directly after the procedure.  You should plan to take it easy, moving slowly for the rest of the day.  You can resume normal activity the day after the procedure however you should NOT DRIVE or use heavy machinery for 24 hours (because of the sedation medicines used during the test).    SYMPTOMS TO REPORT IMMEDIATELY: A gastroenterologist can be reached at any hour.  During normal business hours, 8:30 AM to 5:00 PM Monday through Friday,  call [phone].  After hours and on weekends, please call the GI answering service at [phone] who will take a message and have the physician on call contact you.   Following lower endoscopy (colonoscopy or flexible sigmoidoscopy):  Excessive amounts of blood in the stool  Significant tenderness or worsening of abdominal pains  Swelling of the abdomen that is new, acute  Fever of 100F or higher  FOLLOW UP: If any biopsies were taken you will be contacted by phone or by letter within the next 1-3 weeks.  Call your gastroenterologist if you have not heard about the biopsies in 3 weeks.  Our staff will call the home number listed on your records the next business day following your procedure to check on you and address any questions or concerns that you may have at that time regarding the information given to you following your procedure. This is a courtesy call and so if there is no answer at the home number and we have not heard from you through the emergency physician on call, we will assume that you have returned to your regular daily activities without incident.  SIGNATURES/CONFIDENTIALITY: You and/or your care partner have signed paperwork which will be entered into your electronic medical record.  These signatures attest to the fact that that the information above on your After Visit Summary has been reviewed and is understood.  Full responsibility of the confidentiality of this   discharge information lies with you and/or your care-partner.  Normal exam.  Repeat colonoscopy in 10 years-2026.  

## 2015-01-25 NOTE — Op Note (Signed)
[facility] [address] [place], [id number]   COLONOSCOPY PROCEDURE REPORT  PATIENT: Jeremy Rivera, Jeremy E  MR#: [id number] BIRTHDATE: [birth year] , 63  yrs. old GENDER: male ENDOSCOPIST: [name], MD REFERRED XB:JYNWGBY:[name], M.D. PROCEDURE DATE:  01/25/2015 PROCEDURE:   Colonoscopy, diagnostic First Screening Colonoscopy - Avg.  risk and is 50 yrs.  old or older - No.  Prior Negative Screening - Now for repeat screening. N/A  History of Adenoma - Now for follow-up colonoscopy & has been > or = to 3 yrs.  Yes hx of adenoma.  Has been 3 or more years since last colonoscopy.  Polyps Removed Today? No.  Recommend repeat exam, <10 yrs? No. ASA CLASS:   Class II INDICATIONS:high risk personal history of colonic polyps 2010 MEDICATIONS: Monitored anesthesia care and Propofol 200 mg IV  DESCRIPTION OF PROCEDURE:   After the risks benefits and alternatives of the procedure were thoroughly explained, informed consent was obtained.  The digital rectal exam revealed no abnormalities of the rectum.   The LB [id number]  endoscope was introduced through the anus and advanced to the cecum, which was identified by both the appendix and ileocecal valve. No adverse events experienced.   The quality of the prep was good, using MoviPrep  The instrument was then slowly withdrawn as the colon was fully examined.      COLON FINDINGS: A normal appearing cecum, ileocecal valve, and appendiceal orifice were identified.  the ascending, transverse, descending, sigmoid colon, and rectum appeared unremarkable. Retroflexed views revealed no abnormalities. The time to cecum=3 minutes 38 seconds.  Withdrawal time=6 minutes 20 seconds.  The scope was withdrawn and the procedure completed. COMPLICATIONS: There were no complications.  ENDOSCOPIC IMPRESSION: Normal colonoscopy  RECOMMENDATIONS: Colonoscopy 10 years  eSigned:  Louis Meckelobert D Georgi Navarrete, MD 01/25/2015 11:43  AM   cc:

## 2015-01-25 NOTE — Progress Notes (Signed)
A/ox3, pleased with MAC, report to RN 

## 2015-01-26 ENCOUNTER — Telehealth: Payer: Self-pay | Admitting: *Deleted

## 2015-01-26 NOTE — Telephone Encounter (Signed)
  Follow up Call-  Call back number 01/25/2015  Post procedure Call Back phone  # [phone]  Permission to leave phone message Yes     Patient questions:  Do you have a fever, pain , or abdominal swelling? No. Pain Score  0 *  Have you tolerated food without any problems? Yes.    Have you been able to return to your normal activities? Yes.    Do you have any questions about your discharge instructions: Diet   No. Medications  No. Follow up visit  No.  Do you have questions or concerns about your Care? No.  Actions: * If pain score is 4 or above: No action needed, pain <4.

## 2015-01-29 ENCOUNTER — Other Ambulatory Visit: Payer: Self-pay | Admitting: Family Medicine

## 2015-03-13 DIAGNOSIS — N401 Enlarged prostate with lower urinary tract symptoms: Secondary | ICD-10-CM | POA: Diagnosis not present

## 2015-03-27 DIAGNOSIS — R351 Nocturia: Secondary | ICD-10-CM | POA: Diagnosis not present

## 2015-03-27 DIAGNOSIS — R350 Frequency of micturition: Secondary | ICD-10-CM | POA: Diagnosis not present

## 2015-03-27 DIAGNOSIS — N423 Dysplasia of prostate: Secondary | ICD-10-CM | POA: Diagnosis not present

## 2015-03-27 DIAGNOSIS — R972 Elevated prostate specific antigen [PSA]: Secondary | ICD-10-CM | POA: Diagnosis not present

## 2015-03-27 DIAGNOSIS — N401 Enlarged prostate with lower urinary tract symptoms: Secondary | ICD-10-CM | POA: Diagnosis not present

## 2015-05-31 ENCOUNTER — Ambulatory Visit (INDEPENDENT_AMBULATORY_CARE_PROVIDER_SITE_OTHER): Payer: Medicare Other | Admitting: Family Medicine

## 2015-05-31 ENCOUNTER — Encounter: Payer: Self-pay | Admitting: Family Medicine

## 2015-05-31 VITALS — BP 134/90 | Ht 70.0 in | Wt 268.0 lb

## 2015-05-31 DIAGNOSIS — E785 Hyperlipidemia, unspecified: Secondary | ICD-10-CM | POA: Diagnosis not present

## 2015-05-31 DIAGNOSIS — E119 Type 2 diabetes mellitus without complications: Secondary | ICD-10-CM | POA: Diagnosis not present

## 2015-05-31 DIAGNOSIS — I10 Essential (primary) hypertension: Secondary | ICD-10-CM

## 2015-05-31 DIAGNOSIS — N400 Enlarged prostate without lower urinary tract symptoms: Secondary | ICD-10-CM | POA: Diagnosis not present

## 2015-05-31 LAB — POCT GLYCOSYLATED HEMOGLOBIN (HGB A1C): Hemoglobin A1C: 7.7

## 2015-05-31 MED ORDER — GLUCOSE BLOOD VI STRP: ORAL_STRIP | Status: DC

## 2015-05-31 MED ORDER — TRIAMTERENE-HCTZ 75-50 MG PO TABS: ORAL_TABLET | ORAL | Status: DC

## 2015-05-31 MED ORDER — AMLODIPINE BESY-BENAZEPRIL HCL 10-20 MG PO CAPS: 1.0000 | ORAL_CAPSULE | Freq: Every day | ORAL | Status: DC

## 2015-05-31 MED ORDER — GLIPIZIDE 5 MG PO TABS
ORAL_TABLET | ORAL | Status: DC
Start: 2015-05-31 — End: 2016-02-23

## 2015-05-31 MED ORDER — ATENOLOL 50 MG PO TABS: 50.0000 mg | ORAL_TABLET | Freq: Two times a day (BID) | ORAL | Status: DC

## 2015-05-31 MED ORDER — FINASTERIDE 5 MG PO TABS: 5.0000 mg | ORAL_TABLET | Freq: Every day | ORAL | Status: DC

## 2015-05-31 NOTE — Progress Notes (Signed)
   Subjective:    Patient ID: Jeremy Rivera, male    DOB: [birth year], 64 y.o.   MRN: [id number]  Diabetes He presents for his follow-up diabetic visit. He has type 2 diabetes mellitus. There are no hypoglycemic associated symptoms. There are no diabetic associated symptoms. There are no hypoglycemic complications. Symptoms are stable. There are no diabetic complications. There are no known risk factors for coronary artery disease. Current diabetic treatment includes oral agent (monotherapy). He is compliant with treatment all of the time.   Patient states that he has no concerns at this time.   Results for orders placed or performed in visit on 05/31/15  POCT glycosylated hemoglobin (Hb A1C)  Result Value Ref Range   Hemoglobin A1C 7.7    Glu has been up some lately, 140 or so. Sugars have been drifting up lately. Sometimes Midday Meal  Taking meds faithfully  Urinating overall well . Tolerating Proscar well. Helping with the urination.  Routine exrrcise not so well, keeping g kids these days  Patient claims compliance with blood pressure medication. No obvious side effects. Watching salt intake.  Review of Systems No headache no chest pain no back pain no abdominal pain no change in bowel habits    Objective:   Physical Exam Alert vital stable blood pressure elevated on repeat still at 140/92-94. HEENT normal. Lungs clear heart regular rate and rhythm ankles without edema       Assessment & Plan:  Impression 1 type 2 diabetes suboptimum control discussed #2 hypertension diastolic too high discussed #3 prostate hypertrophy with frequent urination under good control plan diet exercise discussed. Add morning glipizide. No change in blood pressure medication. If on next visit still elevated will need to add additional medicine. WSL

## 2015-07-12 ENCOUNTER — Encounter (HOSPITAL_COMMUNITY): Payer: Self-pay | Admitting: Emergency Medicine

## 2015-07-12 ENCOUNTER — Ambulatory Visit (INDEPENDENT_AMBULATORY_CARE_PROVIDER_SITE_OTHER): Payer: Medicare Other | Admitting: Family Medicine

## 2015-07-12 ENCOUNTER — Encounter: Payer: Self-pay | Admitting: Family Medicine

## 2015-07-12 ENCOUNTER — Observation Stay (HOSPITAL_COMMUNITY)
Admission: EM | Admit: 2015-07-12 | Discharge: 2015-07-14 | Disposition: A | Discharge status: Home / Self Care | Payer: Medicare Other | Attending: Internal Medicine | Admitting: Internal Medicine

## 2015-07-12 ENCOUNTER — Emergency Department (HOSPITAL_COMMUNITY): Payer: Medicare Other

## 2015-07-12 VITALS — BP 144/86 | Temp 98.5°F | Ht 70.0 in | Wt 272.0 lb

## 2015-07-12 DIAGNOSIS — R0602 Shortness of breath: Secondary | ICD-10-CM | POA: Diagnosis not present

## 2015-07-12 DIAGNOSIS — N529 Male erectile dysfunction, unspecified: Secondary | ICD-10-CM | POA: Diagnosis not present

## 2015-07-12 DIAGNOSIS — IMO0002 Reserved for concepts with insufficient information to code with codable children: Secondary | ICD-10-CM | POA: Diagnosis present

## 2015-07-12 DIAGNOSIS — R0600 Dyspnea, unspecified: Secondary | ICD-10-CM | POA: Diagnosis present

## 2015-07-12 DIAGNOSIS — E785 Hyperlipidemia, unspecified: Secondary | ICD-10-CM | POA: Diagnosis not present

## 2015-07-12 DIAGNOSIS — I10 Essential (primary) hypertension: Secondary | ICD-10-CM | POA: Diagnosis present

## 2015-07-12 DIAGNOSIS — E871 Hypo-osmolality and hyponatremia: Secondary | ICD-10-CM | POA: Diagnosis present

## 2015-07-12 DIAGNOSIS — R0609 Other forms of dyspnea: Secondary | ICD-10-CM | POA: Diagnosis not present

## 2015-07-12 DIAGNOSIS — E1165 Type 2 diabetes mellitus with hyperglycemia: Secondary | ICD-10-CM | POA: Diagnosis not present

## 2015-07-12 DIAGNOSIS — Z7982 Long term (current) use of aspirin: Secondary | ICD-10-CM | POA: Diagnosis not present

## 2015-07-12 DIAGNOSIS — M5126 Other intervertebral disc displacement, lumbar region: Secondary | ICD-10-CM | POA: Insufficient documentation

## 2015-07-12 DIAGNOSIS — Z79899 Other long term (current) drug therapy: Secondary | ICD-10-CM | POA: Insufficient documentation

## 2015-07-12 DIAGNOSIS — I519 Heart disease, unspecified: Secondary | ICD-10-CM | POA: Diagnosis not present

## 2015-07-12 DIAGNOSIS — R079 Chest pain, unspecified: Secondary | ICD-10-CM | POA: Diagnosis present

## 2015-07-12 DIAGNOSIS — E876 Hypokalemia: Secondary | ICD-10-CM | POA: Diagnosis present

## 2015-07-12 DIAGNOSIS — E119 Type 2 diabetes mellitus without complications: Secondary | ICD-10-CM | POA: Diagnosis not present

## 2015-07-12 DIAGNOSIS — N400 Enlarged prostate without lower urinary tract symptoms: Secondary | ICD-10-CM | POA: Insufficient documentation

## 2015-07-12 DIAGNOSIS — E663 Overweight: Secondary | ICD-10-CM | POA: Diagnosis not present

## 2015-07-12 DIAGNOSIS — I493 Ventricular premature depolarization: Secondary | ICD-10-CM | POA: Diagnosis present

## 2015-07-12 DIAGNOSIS — I499 Cardiac arrhythmia, unspecified: Secondary | ICD-10-CM | POA: Insufficient documentation

## 2015-07-12 DIAGNOSIS — I5042 Chronic combined systolic (congestive) and diastolic (congestive) heart failure: Secondary | ICD-10-CM | POA: Diagnosis present

## 2015-07-12 HISTORY — DX: Other ill-defined heart diseases: I51.89

## 2015-07-12 HISTORY — DX: Cardiac arrhythmia, unspecified: I49.9

## 2015-07-12 LAB — CBC WITH DIFFERENTIAL/PLATELET
BASOS ABS: 0 10*3/uL (ref 0.0–0.1)
Basophils Relative: 0 % (ref 0–1)
EOS PCT: 1 % (ref 0–5)
Eosinophils Absolute: 0.1 10*3/uL (ref 0.0–0.7)
HEMATOCRIT: 44.2 % (ref 39.0–52.0)
Hemoglobin: 14.5 g/dL (ref 13.0–17.0)
Lymphocytes Relative: 29 % (ref 12–46)
Lymphs Abs: 3.1 10*3/uL (ref 0.7–4.0)
MCH: 27.9 pg (ref 26.0–34.0)
MCHC: 32.8 g/dL (ref 30.0–36.0)
MCV: 85.2 fL (ref 78.0–100.0)
Monocytes Absolute: 0.7 10*3/uL (ref 0.1–1.0)
Monocytes Relative: 6 % (ref 3–12)
Neutro Abs: 6.9 10*3/uL (ref 1.7–7.7)
Neutrophils Relative %: 64 % (ref 43–77)
Platelets: 198 10*3/uL (ref 150–400)
RBC: 5.19 MIL/uL (ref 4.22–5.81)
RDW: 14.8 % (ref 11.5–15.5)
WBC: 10.9 10*3/uL — AB (ref 4.0–10.5)

## 2015-07-12 LAB — COMPREHENSIVE METABOLIC PANEL
ALT: 41 U/L (ref 17–63)
ANION GAP: 10 (ref 5–15)
AST: 29 U/L (ref 15–41)
Albumin: 4.2 g/dL (ref 3.5–5.0)
Alkaline Phosphatase: 67 U/L (ref 38–126)
BUN: 14 mg/dL (ref 6–20)
CO2: 26 mmol/L (ref 22–32)
Calcium: 8.8 mg/dL — ABNORMAL LOW (ref 8.9–10.3)
Chloride: 97 mmol/L — ABNORMAL LOW (ref 101–111)
Creatinine, Ser: 0.99 mg/dL (ref 0.61–1.24)
GLUCOSE: 232 mg/dL — AB (ref 65–99)
Potassium: 3.4 mmol/L — ABNORMAL LOW (ref 3.5–5.1)
SODIUM: 133 mmol/L — AB (ref 135–145)
Total Bilirubin: 0.8 mg/dL (ref 0.3–1.2)
Total Protein: 7.4 g/dL (ref 6.5–8.1)

## 2015-07-12 LAB — CBG MONITORING, ED: Glucose-Capillary: 182 mg/dL — ABNORMAL HIGH (ref 65–99)

## 2015-07-12 LAB — TROPONIN I
TROPONIN I: 0.03 ng/mL (ref <0.031)
Troponin I: <0.03 ng/mL (ref <0.031)

## 2015-07-12 LAB — BRAIN NATRIURETIC PEPTIDE: B Natriuretic Peptide: 388 pg/mL — ABNORMAL HIGH (ref 0.0–100.0)

## 2015-07-12 LAB — D-DIMER, QUANTITATIVE (NOT AT ARMC): D DIMER QUANT: 0.27 ug{FEU}/mL (ref 0.00–0.48)

## 2015-07-12 MED ORDER — FINASTERIDE 5 MG PO TABS
ORAL_TABLET | ORAL | Status: AC
  Filled 2015-07-12: qty 1

## 2015-07-12 MED ORDER — HYDRALAZINE HCL 20 MG/ML IJ SOLN
10.0000 mg | Freq: Four times a day (QID) | INTRAMUSCULAR | Status: DC | PRN
  Administered 2015-07-13: 10 mg via INTRAVENOUS
  Filled 2015-07-12: qty 1

## 2015-07-12 MED ORDER — ENOXAPARIN SODIUM 40 MG/0.4ML ~~LOC~~ SOLN
40.0000 mg | SUBCUTANEOUS | Status: DC
  Administered 2015-07-12 – 2015-07-13 (×2): 40 mg via SUBCUTANEOUS
  Filled 2015-07-12 (×2): qty 0.4

## 2015-07-12 MED ORDER — TRIAMTERENE-HCTZ 75-50 MG PO TABS
1.0000 | ORAL_TABLET | Freq: Every day | ORAL | Status: DC
  Administered 2015-07-12 – 2015-07-14 (×3): 1 via ORAL
  Filled 2015-07-12 (×3): qty 1

## 2015-07-12 MED ORDER — SODIUM CHLORIDE 0.9 % IV SOLN
INTRAVENOUS | Status: DC
  Administered 2015-07-12: 22:00:00 via INTRAVENOUS

## 2015-07-12 MED ORDER — ASPIRIN EC 81 MG PO TBEC
81.0000 mg | DELAYED_RELEASE_TABLET | Freq: Every day | ORAL | Status: DC
  Administered 2015-07-12 – 2015-07-14 (×3): 81 mg via ORAL
  Filled 2015-07-12 (×3): qty 1

## 2015-07-12 MED ORDER — ASPIRIN 81 MG PO TABS: 81.0000 mg | ORAL_TABLET | Freq: Every day | ORAL | Status: DC

## 2015-07-12 MED ORDER — BENAZEPRIL HCL 10 MG PO TABS: 20.0000 mg | ORAL_TABLET | Freq: Every day | ORAL | Status: DC

## 2015-07-12 MED ORDER — AMLODIPINE BESYLATE 5 MG PO TABS
10.0000 mg | ORAL_TABLET | Freq: Every day | ORAL | Status: DC
  Filled 2015-07-12: qty 2

## 2015-07-12 MED ORDER — SODIUM CHLORIDE 0.9 % IJ SOLN
3.0000 mL | Freq: Two times a day (BID) | INTRAMUSCULAR | Status: DC
Start: 2015-07-12 — End: 2015-07-14
  Administered 2015-07-12 – 2015-07-14 (×3): 3 mL via INTRAVENOUS

## 2015-07-12 MED ORDER — ACETAMINOPHEN 325 MG PO TABS: 650.0000 mg | ORAL_TABLET | Freq: Four times a day (QID) | ORAL | Status: DC | PRN

## 2015-07-12 MED ORDER — INSULIN ASPART 100 UNIT/ML ~~LOC~~ SOLN
0.0000 [IU] | Freq: Three times a day (TID) | SUBCUTANEOUS | Status: DC
  Administered 2015-07-13 (×2): 2 [IU] via SUBCUTANEOUS
  Administered 2015-07-13: 3 [IU] via SUBCUTANEOUS
  Administered 2015-07-14: 2 [IU] via SUBCUTANEOUS
  Administered 2015-07-14: 5 [IU] via SUBCUTANEOUS

## 2015-07-12 MED ORDER — AMLODIPINE BESY-BENAZEPRIL HCL 10-20 MG PO CAPS: 1.0000 | ORAL_CAPSULE | Freq: Every day | ORAL | Status: DC

## 2015-07-12 MED ORDER — FINASTERIDE 5 MG PO TABS
5.0000 mg | ORAL_TABLET | Freq: Every day | ORAL | Status: DC
  Administered 2015-07-13: 5 mg via ORAL
  Filled 2015-07-12 (×6): qty 1

## 2015-07-12 MED ORDER — POTASSIUM CHLORIDE CRYS ER 20 MEQ PO TBCR
20.0000 meq | EXTENDED_RELEASE_TABLET | Freq: Once | ORAL | Status: AC
  Administered 2015-07-12: 20 meq via ORAL
  Filled 2015-07-12: qty 1

## 2015-07-12 MED ORDER — ACETAMINOPHEN 650 MG RE SUPP: 650.0000 mg | Freq: Four times a day (QID) | RECTAL | Status: DC | PRN

## 2015-07-12 MED ORDER — INSULIN ASPART 100 UNIT/ML ~~LOC~~ SOLN: 0.0000 [IU] | Freq: Every day | SUBCUTANEOUS | Status: DC

## 2015-07-12 MED ORDER — ATENOLOL 25 MG PO TABS
50.0000 mg | ORAL_TABLET | Freq: Two times a day (BID) | ORAL | Status: DC
  Administered 2015-07-12 – 2015-07-13 (×2): 50 mg via ORAL
  Filled 2015-07-12 (×2): qty 2

## 2015-07-12 MED ORDER — CETYLPYRIDINIUM CHLORIDE 0.05 % MT LIQD
7.0000 mL | Freq: Two times a day (BID) | OROMUCOSAL | Status: DC
  Administered 2015-07-12 – 2015-07-14 (×4): 7 mL via OROMUCOSAL

## 2015-07-12 NOTE — ED Notes (Signed)
PT c/o SOB on exertion x1 day with non-productive cough. PT denies any peripheral edema. PT 89% on room air after exertion.

## 2015-07-12 NOTE — ED Notes (Signed)
Report attempted 

## 2015-07-12 NOTE — Progress Notes (Signed)
   Subjective:    Patient ID: Jeremy Rivera, male    DOB: December 14, 1951, 64 y.o.   MRN: [id number]  Shortness of Breath This is a new problem. The current episode started yesterday. The problem occurs intermittently. The problem has been gradually worsening. Associated symptoms include wheezing. Nothing aggravates the symptoms. Associated symptoms comments: cough. The patient has no known risk factors for DVT/PE. Treatments tried: Coricidin HBP. The treatment provided no relief.   Patient states that he has no other concerns at this time.   Started last nite feeling , feeling sob with activity  No hx of an inhaler use in the past  No temp or no favor Patient exercise outside quite a bit on Monday didn't Monday night started developing shortness of breath with any type of exertion. Accompanied by sweating excessively. Also accompanied by "not feeling right". Woke up in the middle the night feeling short of breath. Last night had similar symptoms but worse.  Patient does not it admit to chest pain but wife notes that he has had pressure in his chest.  Long-standing history of multiple risk factors  Patient does note orthopnea. Review of Systems  Respiratory: Positive for shortness of breath and wheezing.    No headache no neck pain no abdominal pain no true chest pain but a vague sense of pressure    Objective:   Physical Exam  Alert no acute distress HEENT normal blood pressure good. Lungs basilar crackles right more so than left no wheezes heart regular rate and rhythm. Abdomen benign.   EKG subtle but definite changes V2 through V6 with biphasic T-wave and some depression     Assessment & Plan:  Impression substantial concern for acute coronary syndrome discussed at length with patient. Old records reviewed. I spoke emergency room physician. Urgent workup warranted. Plan to up aspirin. Go immediately to ER. Long discussion held. Multiple questions answered. WSL

## 2015-07-12 NOTE — ED Notes (Signed)
Report attempted. Receiving RN to call back. 

## 2015-07-12 NOTE — ED Provider Notes (Signed)
CSN: [id number]     Arrival date & time 07/12/15  1755 History   First MD Initiated Contact with Patient 07/12/15 1759     Chief Complaint  Patient presents with  . Shortness of Breath     (Consider location/radiation/quality/duration/timing/severity/associated sxs/prior Treatment) HPI...Marland KitchenMarland KitchenMarland Kitchen dyspnea on exertion for 2 days after doing vigorous yard work with associated chest tightness and cough.  Cardiac risk factors include:  htn, dm 2, lipids.  He was seen by his primary care doctor today and sent to the emergency department. Exertion makes symptoms worse.  Past Medical History  Diagnosis Date  . Hypertension   . Diabetes mellitus without complication   . Hyperlipidemia   . ED (erectile dysfunction)   . Prostate hypertrophy   . Herniated lumbar intervertebral disc    Past Surgical History  Procedure Laterality Date  . Back surgery  1994 Dr. Roxan Hockey  . Back surgery  1994 Dr. Roxan Hockey  . Colonoscopy      one polyp  . Lumbar laminectomy/decompression microdiscectomy Left 06/22/2014    Procedure: LUMBAR LAMINECTOMY/DECOMPRESSION MICRODISCECTOMY 1 LEVEL;  Surgeon: Carmela Hurt, MD;  Location: [facility];  Service: Neurosurgery;  Laterality: Left;  Redo Left Lumbar Three-Four Microdiskectomy   Family History  Problem Relation Age of Onset  . Hypertension Mother   . Diabetes Mother   . Colon cancer Neg Hx    History  Substance Use Topics  . Smoking status: Never Smoker   . Smokeless tobacco: Never Used  . Alcohol Use: No    Review of Systems  All other systems reviewed and are negative.     Allergies  Review of patient's allergies indicates no known allergies.  Home Medications   Prior to Admission medications   Medication Sig Start Date End Date Taking? Authorizing Provider  amLODipine-benazepril (LOTREL) 10-20 MG per capsule Take 1 capsule by mouth daily. 05/31/15   Merlyn Albert, MD  aspirin 81 MG tablet Take 81 mg by mouth daily.    Historical Provider, MD   atenolol (TENORMIN) 50 MG tablet Take 1 tablet (50 mg total) by mouth 2 (two) times daily. 05/31/15   Merlyn Albert, MD  finasteride (PROSCAR) 5 MG tablet Take 1 tablet (5 mg total) by mouth daily. 05/31/15   Merlyn Albert, MD  glipiZIDE (GLUCOTROL) 5 MG tablet Take two tablets in the morning and one tablet in the evening 05/31/15   Merlyn Albert, MD  glucose blood (ONE TOUCH ULTRA TEST) test strip TEST BLOOD SUGAR ONCE DAILY AS DIRECTED **DX E11.9 05/31/15   Merlyn Albert, MD  sildenafil (VIAGRA) 100 MG tablet Take 1 tablet (100 mg total) by mouth as needed for erectile dysfunction. 03/22/14 03/22/15  Merlyn Albert, MD  triamterene-hydrochlorothiazide (MAXZIDE) 75-50 MG per tablet TAKE 1/2 TABLET ONCE A DAY 05/31/15   Merlyn Albert, MD   BP 168/98 mmHg  Pulse 85  Temp(Src) 99.2 F (37.3 C) (Oral)  Resp 27  Ht  (1.803 m)  Wt 272 lb (123.378 kg)  BMI 37.95 kg/m2  SpO2 96% Physical Exam  Constitutional: He is oriented to person, place, and time. He appears well-developed and well-nourished.  Overweight  HENT:  Head: Normocephalic and atraumatic.  Eyes: Conjunctivae and EOM are normal. Pupils are equal, round, and reactive to light.  Neck: Normal range of motion. Neck supple.  Cardiovascular: Normal rate and regular rhythm.   Pulmonary/Chest: Effort normal and breath sounds normal.  Abdominal: Soft. Bowel sounds are normal.  Musculoskeletal: Normal range of motion.  Neurological: He is alert and oriented to person, place, and time.  Skin: Skin is warm and dry.  Psychiatric: He has a normal mood and affect. His behavior is normal.  Nursing note and vitals reviewed.   ED Course  Procedures (including critical care time) Labs Review Labs Reviewed  CBC WITH DIFFERENTIAL/PLATELET - Abnormal; Notable for the following:    WBC 10.9 (*)    All other components within normal limits  COMPREHENSIVE METABOLIC PANEL - Abnormal; Notable for the following:    Sodium 133 (*)     Potassium 3.4 (*)    Chloride 97 (*)    Glucose, Bld 232 (*)    Calcium 8.8 (*)    All other components within normal limits  CBG MONITORING, ED - Abnormal; Notable for the following:    Glucose-Capillary 182 (*)    All other components within normal limits  TROPONIN I    Imaging Review Dg Chest 2 View  07/12/2015   CLINICAL DATA:  Increasing short breath since Monday  EXAM: CHEST  2 VIEW  COMPARISON:  Radiograph 06/20/2014  FINDINGS: Normal cardiac silhouette. There is chronic elevation left hemidiaphragm. Mild bronchitic markings. This atelectasis left lung base. Osteophytosis of the spine.  IMPRESSION: 1. No acute cardiopulmonary findings. 2. Elevation of the left hemidiaphragm with chronic left basilar atelectasis.   Electronically Signed   By: Genevive BiStewart  Edmunds M.D.   On: 07/12/2015 18:55     EKG Interpretation   Date/Time:  Wednesday July 12 2015 18:01:21 EDT Ventricular Rate:  92 PR Interval:  203 QRS Duration: 108 QT Interval:  387 QTC Calculation: 479 R Axis:   28 Text Interpretation:  Sinus rhythm Borderline T wave abnormalities  Borderline prolonged QT interval Baseline wander in lead(s) I III aVL  Confirmed by Danuta Huseman  MD, Skylinn Vialpando ([id number]) on 07/12/2015 6:05:12 PM      MDM   Final diagnoses:  Dyspnea on exertion  Chest pain, unspecified chest pain type    Patient is hemodynamically stable. Glucose 182. EKG negative. Troponin negative. Chest x-ray shows no acute findings. Admit to observation.    Donnetta HutchingBrian Shreena Baines, MD 07/12/15 2011

## 2015-07-13 ENCOUNTER — Observation Stay (HOSPITAL_BASED_OUTPATIENT_CLINIC_OR_DEPARTMENT_OTHER): Payer: Medicare Other

## 2015-07-13 ENCOUNTER — Encounter (HOSPITAL_COMMUNITY): Payer: Self-pay | Admitting: Physician Assistant

## 2015-07-13 DIAGNOSIS — R079 Chest pain, unspecified: Secondary | ICD-10-CM | POA: Diagnosis not present

## 2015-07-13 DIAGNOSIS — R0600 Dyspnea, unspecified: Secondary | ICD-10-CM | POA: Diagnosis not present

## 2015-07-13 DIAGNOSIS — E1165 Type 2 diabetes mellitus with hyperglycemia: Secondary | ICD-10-CM

## 2015-07-13 DIAGNOSIS — I10 Essential (primary) hypertension: Secondary | ICD-10-CM | POA: Diagnosis not present

## 2015-07-13 DIAGNOSIS — R0609 Other forms of dyspnea: Secondary | ICD-10-CM | POA: Diagnosis not present

## 2015-07-13 DIAGNOSIS — I493 Ventricular premature depolarization: Secondary | ICD-10-CM | POA: Diagnosis present

## 2015-07-13 LAB — COMPREHENSIVE METABOLIC PANEL
ALBUMIN: 4 g/dL (ref 3.5–5.0)
ALT: 40 U/L (ref 17–63)
AST: 33 U/L (ref 15–41)
Alkaline Phosphatase: 64 U/L (ref 38–126)
Anion gap: 11 (ref 5–15)
BUN: 12 mg/dL (ref 6–20)
CALCIUM: 8.5 mg/dL — AB (ref 8.9–10.3)
CHLORIDE: 97 mmol/L — AB (ref 101–111)
CO2: 25 mmol/L (ref 22–32)
Creatinine, Ser: 0.98 mg/dL (ref 0.61–1.24)
GFR calc Af Amer: >60 mL/min (ref >60)
Glucose, Bld: 201 mg/dL — ABNORMAL HIGH (ref 65–99)
POTASSIUM: 3.9 mmol/L (ref 3.5–5.1)
Sodium: 133 mmol/L — ABNORMAL LOW (ref 135–145)
TOTAL PROTEIN: 7.2 g/dL (ref 6.5–8.1)
Total Bilirubin: 1 mg/dL (ref 0.3–1.2)

## 2015-07-13 LAB — CBC WITH DIFFERENTIAL/PLATELET
Basophils Absolute: 0 10*3/uL (ref 0.0–0.1)
Basophils Relative: 0 % (ref 0–1)
EOS ABS: 0.1 10*3/uL (ref 0.0–0.7)
Eosinophils Relative: 1 % (ref 0–5)
HCT: 44.5 % (ref 39.0–52.0)
HEMOGLOBIN: 14.7 g/dL (ref 13.0–17.0)
Lymphocytes Relative: 23 % (ref 12–46)
Lymphs Abs: 2.3 10*3/uL (ref 0.7–4.0)
MCH: 28.1 pg (ref 26.0–34.0)
MCHC: 33 g/dL (ref 30.0–36.0)
MCV: 84.9 fL (ref 78.0–100.0)
MONO ABS: 0.6 10*3/uL (ref 0.1–1.0)
Monocytes Relative: 7 % (ref 3–12)
NEUTROS PCT: 69 % (ref 43–77)
Neutro Abs: 6.9 10*3/uL (ref 1.7–7.7)
Platelets: 190 10*3/uL (ref 150–400)
RBC: 5.24 MIL/uL (ref 4.22–5.81)
RDW: 14.6 % (ref 11.5–15.5)
WBC: 9.9 10*3/uL (ref 4.0–10.5)

## 2015-07-13 LAB — GLUCOSE, CAPILLARY
GLUCOSE-CAPILLARY: 174 mg/dL — AB (ref 65–99)
GLUCOSE-CAPILLARY: 166 mg/dL — AB (ref 65–99)
GLUCOSE-CAPILLARY: 235 mg/dL — AB (ref 65–99)
Glucose-Capillary: 161 mg/dL — ABNORMAL HIGH (ref 65–99)
Glucose-Capillary: 205 mg/dL — ABNORMAL HIGH (ref 65–99)
Glucose-Capillary: 163 mg/dL — ABNORMAL HIGH (ref 65–99)
Glucose-Capillary: 199 mg/dL — ABNORMAL HIGH (ref 65–99)

## 2015-07-13 LAB — TSH: TSH: 1.382 u[IU]/mL (ref 0.350–4.500)

## 2015-07-13 LAB — TROPONIN I
Troponin I: <0.03 ng/mL (ref <0.031)
Troponin I: <0.03 ng/mL (ref <0.031)

## 2015-07-13 LAB — MAGNESIUM: Magnesium: 2 mg/dL (ref 1.7–2.4)

## 2015-07-13 MED ORDER — POTASSIUM CHLORIDE CRYS ER 20 MEQ PO TBCR
20.0000 meq | EXTENDED_RELEASE_TABLET | Freq: Every day | ORAL | Status: DC
  Administered 2015-07-13 – 2015-07-14 (×2): 20 meq via ORAL
  Filled 2015-07-13 (×2): qty 1

## 2015-07-13 MED ORDER — BENAZEPRIL HCL 10 MG PO TABS
40.0000 mg | ORAL_TABLET | Freq: Every day | ORAL | Status: DC
  Administered 2015-07-13 – 2015-07-14 (×2): 40 mg via ORAL
  Filled 2015-07-13 (×2): qty 4

## 2015-07-13 MED ORDER — POTASSIUM CHLORIDE CRYS ER 20 MEQ PO TBCR: 20.0000 meq | EXTENDED_RELEASE_TABLET | Freq: Every day | ORAL | Status: DC

## 2015-07-13 MED ORDER — FUROSEMIDE 10 MG/ML IJ SOLN
40.0000 mg | Freq: Once | INTRAMUSCULAR | Status: AC
  Administered 2015-07-13: 40 mg via INTRAVENOUS
  Filled 2015-07-13: qty 4

## 2015-07-13 MED ORDER — AMLODIPINE BESYLATE 5 MG PO TABS
10.0000 mg | ORAL_TABLET | Freq: Every day | ORAL | Status: DC
  Administered 2015-07-13 – 2015-07-14 (×2): 10 mg via ORAL
  Filled 2015-07-13 (×2): qty 2

## 2015-07-13 MED ORDER — FUROSEMIDE 10 MG/ML IJ SOLN
40.0000 mg | Freq: Two times a day (BID) | INTRAMUSCULAR | Status: DC
  Administered 2015-07-14: 40 mg via INTRAVENOUS
  Filled 2015-07-13: qty 4

## 2015-07-13 MED ORDER — TRIAMTERENE-HCTZ 75-50 MG PO TABS: 1.0000 | ORAL_TABLET | Freq: Every day | ORAL | Status: DC

## 2015-07-13 MED ORDER — CARVEDILOL 12.5 MG PO TABS
12.5000 mg | ORAL_TABLET | Freq: Two times a day (BID) | ORAL | Status: DC
  Administered 2015-07-13 – 2015-07-14 (×2): 12.5 mg via ORAL
  Filled 2015-07-13 (×2): qty 1

## 2015-07-13 MED ORDER — FUROSEMIDE 40 MG PO TABS: 40.0000 mg | ORAL_TABLET | Freq: Every day | ORAL | Status: DC | PRN

## 2015-07-13 MED ORDER — CARVEDILOL 12.5 MG PO TABS: 12.5000 mg | ORAL_TABLET | Freq: Two times a day (BID) | ORAL | Status: DC

## 2015-07-13 NOTE — Consult Note (Addendum)
Cardiology Consultation Note  Patient ID: Jeremy Rivera, MRN: [id number], DOB/AGE: 05/06/1951 64 y.o. Admit date: 07/12/2015   Date of Consult: 07/13/2015 Primary Physician: Jeremy SouthSteve Luking, MD Primary Cardiologist: New to Jeremy Rivera  Chief Complaint: SOB Reason for Consultation: SOB  HPI: Jeremy Rivera is a 64 y/o nonsmoking male with history of HTN, DM x 3 years, and "irregular heartbeat" since 1980s (EKG 2005 reported PVCs) who [facility]'s office for dyspnea. He worked out in the yard on Sunday 7/9 in the heat for 6-7 hours and doesn't think he stayed hydrated. He was using the weed eater without significant limitation. He did notice in retrospect mild dyspnea while going up and down hills but not enough to interrupt his workflow. Monday night he woke up abruptly with SOB and had to sit on the side of the bed to catch his breath. On Tuesday, he didn't feel too bad for most of the day but took it easy. Around dinnertime he developed a recurrence of SOB which triggered a sensation of anxiety which in turn made his SOB worse. His daughter checked his BP and it was 199/139 which was unusual for him -> repeat BPs were around 140/110. He calmed down and made plans to see his PCP the next day. He saw his PCP yesterday afternoon and reported he'd been feeling more SOB with activity. No chest pain whatsoever. Denies any recurrent PND. No LEE, orthopnea, weight change since November 2015, presyncope, syncope, palpitations. He did have some wheezing. Telemetry reveals NSR with occasional PVCs, sometimes in bigeminal fashion, one couplet and one triplet. CXR with chronic elevation of left hemidiaphragm, no acute CP findings. Troponins neg x 3. BNP 388. CBC showed WBC 10.9 yesterday, now normal. Na is noted to 133, CBG 160s-200. D-dimer negative. No recent long travel, surgery or bedrest. Has family history of ministrokes in his mother but no known CAD in the family. BP running higher since  admission - 150s-180s systolic with high diastolics.  Says he has been skipping Lotrel because he thought this was making him have to urinate more at night.  Past Medical History  Diagnosis Date  . Hypertension   . Diabetes mellitus without complication   . ED (erectile dysfunction)   . Prostate hypertrophy   . Herniated lumbar intervertebral disc   . Irregular heart beat     a. Pt does not know official diagnosis.      Most Recent Cardiac Studies: Remote stress test in 2004 was normal per Epic.   Surgical History:  Past Surgical History  Procedure Laterality Date  . Back surgery  1994 Dr. Roxan Rivera  . Back surgery  1994 Dr. Roxan Rivera  . Colonoscopy      one polyp  . Lumbar laminectomy/decompression microdiscectomy Left 06/22/2014    Procedure: LUMBAR LAMINECTOMY/DECOMPRESSION MICRODISCECTOMY 1 LEVEL;  Surgeon: Jeremy HurtKyle L Cabbell, MD;  Location: [facility];  Service: Neurosurgery;  Laterality: Left;  Redo Left Lumbar Three-Four Microdiskectomy     Home Meds: Prior to Admission medications   Medication Sig Start Date End Date Taking? Authorizing Provider  amLODipine-benazepril (LOTREL) 10-20 MG per capsule Take 1 capsule by mouth daily. 05/31/15  Yes Jeremy AlbertWilliam S Luking, MD  aspirin EC 81 MG tablet Take 81 mg by mouth daily.   Yes Historical Provider, MD  atenolol (TENORMIN) 50 MG tablet Take 1 tablet (50 mg total) by mouth 2 (two) times daily. 05/31/15  Yes Jeremy AlbertWilliam S Luking, MD  finasteride (PROSCAR) 5 MG  tablet Take 1 tablet (5 mg total) by mouth daily. 05/31/15  Yes Jeremy Albert, MD  glipiZIDE (GLUCOTROL) 5 MG tablet Take two tablets in the morning and one tablet in the evening Patient taking differently: Take 5-10 mg by mouth 2 (two) times daily before a meal. Take two tablets in the morning and one tablet in the evening 05/31/15  Yes Jeremy Albert, MD  sildenafil (VIAGRA) 100 MG tablet Take 100 mg by mouth as needed for erectile dysfunction.   Yes Historical Provider, MD   triamterene-hydrochlorothiazide (MAXZIDE) 75-50 MG per tablet TAKE 1/2 TABLET ONCE A DAY Patient taking differently: Take 0.5 tablets by mouth daily.  05/31/15  Yes Jeremy Albert, MD  glucose blood (ONE TOUCH ULTRA TEST) test strip TEST BLOOD SUGAR ONCE DAILY AS DIRECTED **DX E11.9 05/31/15   Jeremy Albert, MD    Inpatient Medications:  . amLODipine  10 mg Oral Daily   And  . benazepril  20 mg Oral Daily  . antiseptic oral rinse  7 mL Mouth Rinse BID  . aspirin EC  81 mg Oral Daily  . atenolol  50 mg Oral BID  . enoxaparin (LOVENOX) injection  40 mg Subcutaneous Q24H  . finasteride  5 mg Oral Daily  . insulin aspart  0-5 Units Subcutaneous QHS  . insulin aspart  0-9 Units Subcutaneous TID WC  . sodium chloride  3 mL Intravenous Q12H  . triamterene-hydrochlorothiazide  1 tablet Oral Daily   . sodium chloride 75 mL/hr at 07/12/15 2204    Allergies: No Known Allergies  History   Social History  . Marital Status: Married    Spouse Name: N/A  . Number of Children: N/A  . Years of Education: N/A   Occupational History  . Not on file.   Social History Main Topics  . Smoking status: Never Smoker   . Smokeless tobacco: Never Used  . Alcohol Use: No  . Drug Use: No  . Sexual Activity: Not on file   Other Topics Concern  . Not on file   Social History Narrative     Family History  Problem Relation Age of Onset  . Hypertension Mother   . Diabetes Mother   . Colon cancer Neg Hx   . Transient ischemic attack Mother     Died of ministrokes at 61  . Depression Father     Died of "grieving"  . COPD Brother     Died of COPD  . Throat cancer Sister     Died of throat CA     Review of Systems: All other systems reviewed and are otherwise negative except as noted above.  Labs:  Recent Labs  07/12/15 1813 07/12/15 2135 07/13/15 0333  TROPONINI <0.03 0.03 <0.03   Lab Results  Component Value Date   WBC 9.9 07/13/2015   HGB 14.7 07/13/2015   HCT 44.5  07/13/2015   MCV 84.9 07/13/2015   PLT 190 07/13/2015     Recent Labs Lab 07/13/15 0333  NA 133*  K 3.9  CL 97*  CO2 25  BUN 12  CREATININE 0.98  CALCIUM 8.5*  PROT 7.2  BILITOT 1.0  ALKPHOS 64  ALT 40  AST 33  GLUCOSE 201*   Lab Results  Component Value Date   CHOL 197 01/03/2015   HDL 39* 01/03/2015   LDLCALC 125* 01/03/2015   TRIG 166* 01/03/2015   Lab Results  Component Value Date   DDIMER 0.27 07/12/2015  Radiology/Studies:  Dg Chest 2 View  07/12/2015   CLINICAL DATA:  Increasing short breath since Monday  EXAM: CHEST  2 VIEW  COMPARISON:  Radiograph 06/20/2014  FINDINGS: Normal cardiac silhouette. There is chronic elevation left hemidiaphragm. Mild bronchitic markings. This atelectasis left lung base. Osteophytosis of the spine.  IMPRESSION: 1. No acute cardiopulmonary findings. 2. Elevation of the left hemidiaphragm with chronic left basilar atelectasis.   Electronically Signed   By: Genevive Bi M.D.   On: 07/12/2015 18:55    Wt Readings from Last 3 Encounters:  07/13/15 263 lb (119.296 kg)  07/12/15 272 lb (123.378 kg)  05/31/15 268 lb (121.564 kg)   EKG: NSR 92bpm, nonspecific ST-T changes  Physical Exam: Blood pressure 164/103, pulse 86, temperature 99.6 F (37.6 C), temperature source Oral, resp. rate 18, height 5\' 11"  (1.803 m), weight 263 lb (119.296 kg), SpO2 95 %. General: Well developed obese AAM in no acute distress. Head: Normocephalic, atraumatic, sclera non-icteric, no xanthomas, nares are without discharge.  Neck: Negative for carotid bruits. JVD not elevated. Lungs: Clear bilaterally to auscultation without wheezes, rales, or rhonchi. Breathing is unlabored. Heart: RRR with S1 S2. No murmurs, rubs, or gallops appreciated. Abdomen: Soft, non-tender, non-distended with normoactive bowel sounds. No hepatomegaly. No rebound/guarding. No obvious abdominal masses. Msk:  Strength and tone appear normal for age. Extremities: No clubbing or  cyanosis. No edema.  Distal pedal pulses are 2+ and equal bilaterally. Neuro: Alert and oriented X 3. No facial asymmetry. No focal deficit. Moves all extremities spontaneously. Psych:  Responds to questions appropriately with a normal affect.    Assessment and Plan:   1. Intermittent dyspnea - etiology not entirely clear. Symptoms are somewhat atypical for angina, sound more possibly related to mild acute diastolic CHF exacerbation in the setting of recently high blood pressures (mildly elevated BNP, sodium down). He denies recent chest pain. Lots of PVCs on telemetry. Would start with echocardiogram to define LVEF. If EF is down, would need to consider cardiac cath for definitive evaluation, but if EF is preserved then stress testing is reasonable to exclude ischemia. Will discuss further management with Dr. Wyline Mood. He is already on HCTZ 50mg  daily. Lipids in AM.  2. Essential HTN, not well controlled - will need to follow on current regimen - has not been taking Lotrel recently due to nocturia. Suspect this is more likely due to his BPH (+/- HCTZ). May benefit from addition of Flomax. He is on quite a few BP meds. If BP remains elevated despite this regimen may need to consider renal duplex to exclude RAS.  3. PVCs - not clear that these are playing a role in symptoms. He reports long history of irregular heartbeat. Hypokalemic on admission, improved with repletion. Likely needs daily KCl given the BP meds he's on. Check Mg level.  4. Diabetes mellitus - per IM.   SignedRonie Spies PA-C 07/13/2015, 8:27 AM Pager: [phone]  Patient seen and discussed with PA Dunn, I agree with her documentation above. 64 yo male with history of HTN, hyperlipidemia, DM2 admitted with DOE and orthopnea, mild weight gain. No chest pain.  D-dimer 0.27, BNP 388, Cr 0.99, BUN 14, K 3.4, Hgb 14.5, WBC 10.9 trop neg x 2.  CXR no acute process EKG SR, LVH  Admitted with DOE of unclear etiology. Will start with  echo, he does have LVH on EKG and is at risk for diastolic dysfunction, especially given history of difficult to control bp. No evidence of  ACS, he has not been having chest pain. Potential further cardiac testing pending echo results. BP not controlled, will increase benazepril to 40mg  daily. He does report mixed compliance with some of his bp meds, will follow on current regimen in hospital. Symptoms could be related to elevated bp's at home and exacerbation of diastolic dysfunction, f/u echo results.     Dominga Ferry MD

## 2015-07-13 NOTE — Progress Notes (Signed)
Patient wioth increase SOB and diaphoretic. CBG 235. BP 131/78 O2 sats 95% room air. Dr. Kerry HoughMemon notified.

## 2015-07-13 NOTE — Progress Notes (Signed)
Echo shows mild LV systolic dysfunction LVEF 40%, grade II diastolic dysfunction. This is likely the cause of his DOE. 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 In absence of chest pain, with no focal WMAs, and only mild systolic dysfunction would focus initially on bp control and medical therapy as opposed to ischemic testing at this time. Change atenolol to coreg he is already on ACE-I. Consider additional med titration as outpatient. Will give a dose of IV lasix given symptoms, cardiac dysfunction, and mildly elevated BNP. Would suggest continuing his thiazide diuretic for bp and starting prn lasix at discharge for swelling or SOB.   Dominga FerryJ Dhani Imel MD

## 2015-07-13 NOTE — Care Management Note (Signed)
Case Management Note  Patient Details  Name: Jeremy Rivera MRN: [id number] Date of Birth: Feb 26, 1951  Subjective/Objective:                  Pt admitted from home with CP. Pt lives with his wife and will return home at discharge. Pt is independent with ADL's.   Action/Plan: No CM needs noted.  Expected Discharge Date:     07/14/15             Expected Discharge Plan:  Home/Self Care  In-House Referral:  NA  Discharge planning Services  CM Consult  Post Acute Care Choice:  NA Choice offered to:  NA  DME Arranged:    DME Agency:     HH Arranged:    HH Agency:     Status of Service:  Completed, signed off  Medicare Important Message Given:    Date Medicare IM Given:    Medicare IM give by:    Date Additional Medicare IM Given:    Additional Medicare Important Message give by:     If discussed at Long Length of Stay Meetings, dates discussed:    Additional Comments:  Cheryl FlashBlackwell, [name], RN 07/13/2015, 12:34 PM

## 2015-07-13 NOTE — Discharge Summary (Signed)
Physician Discharge Summary  Jeremy Rivera UJW:[id number] DOB: Jun 25, 1951 DOA: 07/12/2015  PCP: Lubertha South, MD  Admit date: 07/12/2015 Discharge date: 07/13/2015  Time spent: 40 minutes  Recommendations for Outpatient Follow-up:  1. Follow up with PCP 07/17/15 for evaluation of BP control. Recommend BMET to monitor potassium 2. Follow up with cardiology as scheduled  Discharge Diagnoses:  Principal Problem:   Dyspnea Active Problems:   Diabetes type 2, uncontrolled   Dyspnea on exertion   Hypokalemia   Hyponatremia   Essential hypertension   PVC's (premature ventricular contractions)   Discharge Condition: stable  Diet recommendation: heart healthy carb modified  Filed Weights   07/12/15 1804 07/12/15 2140 07/13/15 0609  Weight: 123.378 kg (272 lb) 122.698 kg (270 lb 8 oz) 119.296 kg (263 lb)    History of present illness:  64 yo male with htn, hyperlipidemia, dm2 c/o dyspnea, Started last nite. + dyspnea with exertion. + orthopnea, Slight weight gain over the past few months. + dry cough last nite, with slight wheezing last nite. Denies fever, chills, cp, palp, n/v. Pt presented to office and pt was sent to ED for evaluation  Hospital Course:  Dyspnea: likely related to diastolic dysfunction secondary to uncontrolled BP.  Echo with EF 40% grade 2 diastolic dysfunction. Troponin negative x3, EKG without acute changes, no chest pain. Chest xray no acute findings.  Evaluated by cardiology who opine in setting of poorly controlled HTN "likley hypertensive CM". Recommended changing atenolol to coreg continue ACE-I and amlodipine and maxide and adding prn lasix. He was given IV lasix and discharged. Oxygen saturation level   Hypokalemia: repleted. Recommend BMET 07/17/15 Replete potassium Check cmp in am  Hyponatremia: mild. Given IV fluids  Dm2: a1c in process.   Procedures:  echo - Left ventricle: The cavity size was normal. Wall thickness was increased in a  pattern of mild LVH. Systolic function was mildly to moderately reduced. The estimated ejection fraction was = 40%. No regional wall motion abnormalities. Features are consistent with a pseudonormal left ventricular filling pattern, with concomitant abnormal relaxation and increased filling pressure (grade 2 diastolic dysfunction).   Consultations:  cardiology  Discharge Exam: Filed Vitals:   07/13/15 1316  BP: 141/90  Pulse: 77  Temp: 99.3 F (37.4 C)  Resp: 18    General: obese appears comfortable Cardiovascular: RRR no MGR no LE edema Respiratory: mild increased work of breathing. Crackles bases no wheeze  Discharge Instructions    Current Discharge Medication List    START taking these medications   Details  carvedilol (COREG) 12.5 MG tablet Take 1 tablet (12.5 mg total) by mouth 2 (two) times daily with a meal. Qty: 60 tablet, Refills: 1    furosemide (LASIX) 40 MG tablet Take 1 tablet (40 mg total) by mouth daily as needed for edema. And or shortness of breath Qty: 60 tablet, Refills: 0    potassium chloride SA (K-DUR,KLOR-CON) 20 MEQ tablet Take 1 tablet (20 mEq total) by mouth daily. Qty: 30 tablet, Refills: 1      CONTINUE these medications which have CHANGED   Details  triamterene-hydrochlorothiazide (MAXZIDE) 75-50 MG per tablet Take 1 tablet by mouth daily. Qty: 30 tablet, Refills: 0      CONTINUE these medications which have NOT CHANGED   Details  amLODipine-benazepril (LOTREL) 10-20 MG per capsule Take 1 capsule by mouth daily. Qty: 90 capsule, Refills: 1    aspirin EC 81 MG tablet Take 81 mg by mouth daily.  finasteride (PROSCAR) 5 MG tablet Take 1 tablet (5 mg total) by mouth daily. Qty: 30 tablet, Refills: 5    glipiZIDE (GLUCOTROL) 5 MG tablet Take two tablets in the morning and one tablet in the evening Qty: 60 tablet, Refills: 5   Associated Diagnoses: Type 2 diabetes mellitus without complication    sildenafil (VIAGRA)  100 MG tablet Take 100 mg by mouth as needed for erectile dysfunction.    glucose blood (ONE TOUCH ULTRA TEST) test strip TEST BLOOD SUGAR ONCE DAILY AS DIRECTED **DX E11.9 Qty: 50 each, Refills: 01      STOP taking these medications     atenolol (TENORMIN) 50 MG tablet        No Known Allergies Follow-up Information    Follow up with Steve Luking, MD.   Specialty:  [facility]   Contact information:   [address] [place] [address] [phone]       Follow up On 07/17/2015.   Why:  appointment 1pm      Follow up with Jacolyn ReedyMichele Lenze, PA-C.   Specialty:  Cardiology   Why:  [facility] Wed. July 20th at 1:40pm   Contact information:   [address] [place] [address] [phone]        The results of significant diagnostics from this hospitalization (including imaging, microbiology, ancillary and laboratory) are listed below for reference.    Significant Diagnostic Studies: Dg Chest 2 View  07/12/2015   CLINICAL DATA:  Increasing short breath since Monday  EXAM: CHEST  2 VIEW  COMPARISON:  Radiograph 06/20/2014  FINDINGS: Normal cardiac silhouette. There is chronic elevation left hemidiaphragm. Mild bronchitic markings. This atelectasis left lung base. Osteophytosis of the spine.  IMPRESSION: 1. No acute cardiopulmonary findings. 2. Elevation of the left hemidiaphragm with chronic left basilar atelectasis.   Electronically Signed   By: Genevive BiStewart  Edmunds M.D.   On: 07/12/2015 18:55    Microbiology: No results found for this or any previous visit (from the past 240 hour(s)).   Labs: Basic Metabolic Panel:  Recent Labs Lab 07/12/15 1813 07/13/15 0333 07/13/15 0925  NA 133* 133*  --   K 3.4* 3.9  --   CL 97* 97*  --   CO2 26 25  --   GLUCOSE 232* 201*  --   BUN 14 12  --   CREATININE 0.99 0.98  --   CALCIUM 8.8* 8.5*  --   MG  --   --  2.0   Liver Function Tests:  Recent Labs Lab  07/12/15 1813 07/13/15 0333  AST 29 33  ALT 41 40  ALKPHOS 67 64  BILITOT 0.8 1.0  PROT 7.4 7.2  ALBUMIN 4.2 4.0   No results for input(s): LIPASE, AMYLASE in the last 168 hours. No results for input(s): AMMONIA in the last 168 hours. CBC:  Recent Labs Lab 07/12/15 1813 07/13/15 0333  WBC 10.9* 9.9  NEUTROABS 6.9 6.9  HGB 14.5 14.7  HCT 44.2 44.5  MCV 85.2 84.9  PLT 198 190   Cardiac Enzymes:  Recent Labs Lab 07/12/15 1813 07/12/15 2135 07/13/15 0333 07/13/15 0925  TROPONINI <0.03 0.03 <0.03 <0.03   BNP: BNP (last 3 results)  Recent Labs  07/12/15 1813  BNP 388.0*    ProBNP (last 3 results) No results for input(s): PROBNP in the last 8760 hours.  CBG:  Recent Labs Lab 07/12/15 1957 07/12/15 2200 07/13/15 [id number] 07/13/15  0750 07/13/15 1118  GLUCAP 182* 174* 161* 205* 166*       Signed:  Morey Andonian M  [facility] 07/13/2015, 4:22 PM

## 2015-07-14 DIAGNOSIS — R0609 Other forms of dyspnea: Secondary | ICD-10-CM | POA: Diagnosis not present

## 2015-07-14 DIAGNOSIS — I10 Essential (primary) hypertension: Secondary | ICD-10-CM | POA: Diagnosis not present

## 2015-07-14 DIAGNOSIS — I5041 Acute combined systolic (congestive) and diastolic (congestive) heart failure: Secondary | ICD-10-CM

## 2015-07-14 DIAGNOSIS — I5042 Chronic combined systolic (congestive) and diastolic (congestive) heart failure: Secondary | ICD-10-CM | POA: Diagnosis present

## 2015-07-14 DIAGNOSIS — E1165 Type 2 diabetes mellitus with hyperglycemia: Secondary | ICD-10-CM | POA: Diagnosis not present

## 2015-07-14 LAB — BASIC METABOLIC PANEL
Anion gap: 10 (ref 5–15)
BUN: 18 mg/dL (ref 6–20)
CO2: 29 mmol/L (ref 22–32)
Calcium: 8.6 mg/dL — ABNORMAL LOW (ref 8.9–10.3)
Chloride: 95 mmol/L — ABNORMAL LOW (ref 101–111)
Creatinine, Ser: 1.02 mg/dL (ref 0.61–1.24)
GFR calc Af Amer: >60 mL/min (ref >60)
GFR calc non Af Amer: >60 mL/min (ref >60)
Glucose, Bld: 159 mg/dL — ABNORMAL HIGH (ref 65–99)
POTASSIUM: 3.1 mmol/L — AB (ref 3.5–5.1)
Sodium: 134 mmol/L — ABNORMAL LOW (ref 135–145)

## 2015-07-14 LAB — GLUCOSE, CAPILLARY
GLUCOSE-CAPILLARY: 176 mg/dL — AB (ref 65–99)
Glucose-Capillary: 288 mg/dL — ABNORMAL HIGH (ref 65–99)

## 2015-07-14 LAB — LIPID PANEL
Cholesterol: 174 mg/dL (ref 0–200)
HDL: 42 mg/dL (ref >40)
LDL Cholesterol: 107 mg/dL — ABNORMAL HIGH (ref 0–99)
Total CHOL/HDL Ratio: 4.1 RATIO
Triglycerides: 124 mg/dL (ref <150)
VLDL: 25 mg/dL (ref 0–40)

## 2015-07-14 LAB — HEMOGLOBIN A1C
Hgb A1c MFr Bld: 8.1 % — ABNORMAL HIGH (ref 4.8–5.6)
Mean Plasma Glucose: 186 mg/dL

## 2015-07-14 MED ORDER — POTASSIUM CHLORIDE CRYS ER 20 MEQ PO TBCR
40.0000 meq | EXTENDED_RELEASE_TABLET | Freq: Once | ORAL | Status: AC
  Administered 2015-07-14: 40 meq via ORAL
  Filled 2015-07-14: qty 2

## 2015-07-14 NOTE — Care Management Note (Signed)
Case Management Note  Patient Details  Name: Jeremy Rivera MRN: [id number] Date of Birth: March 09, 1951  Subjective/Objective:                    Action/Plan:   Expected Discharge Date:                  Expected Discharge Plan:  Home/Self Care  In-House Referral:  NA  Discharge planning Services  CM Consult  Post Acute Care Choice:  NA Choice offered to:  NA  DME Arranged:    DME Agency:     HH Arranged:    HH Agency:     Status of Service:  Completed, signed off  Medicare Important Message Given:    Date Medicare IM Given:    Medicare IM give by:    Date Additional Medicare IM Given:    Additional Medicare Important Message give by:     If discussed at Long Length of Stay Meetings, dates discussed:    Additional Comments: Pt discharged home today.No CM needs noted. Pt does not qualify for home O2 at this time. Arlyss QueenBlackwell, Sreekar Bro[place], RN 07/14/2015, 12:29 PM

## 2015-07-14 NOTE — Discharge Planning (Signed)
Pt leaving today going home, pt received all discharge paperwork, no c/o pain, vitals stable, pt left with all belongings pt had no questions

## 2015-07-14 NOTE — Progress Notes (Cosign Needed)
ERROR

## 2015-07-14 NOTE — Discharge Summary (Signed)
Physician Discharge Summary  Jeremy Rivera ZOX:[id number] DOB: 1951-07-15 DOA: 07/12/2015  PCP: Lubertha SouthSteve Luking, MD  Admit date: 07/12/2015 Discharge date: 07/14/2015  Time spent: 40 minutes  Recommendations for Outpatient Follow-up:   Follow up with PCP 07/17/15 for evaluation of BP control and DM control. Recommend BMET to monitor potassium  Follow up with cardiology 07/19/15  Discharge Diagnoses:  Principal Problem:   Acute combined systolic and diastolic CHF, NYHA class 2 Active Problems:   Diabetes type 2, uncontrolled   Dyspnea on exertion   Hypokalemia   Hyponatremia   Dyspnea   Essential hypertension   PVC's (premature ventricular contractions)   Pain in the chest   Discharge Condition: stable  Diet recommendation: heart healthy carb modified  Filed Weights   07/12/15 2140 07/13/15 0609 07/14/15 0500  Weight: 122.698 kg (270 lb 8 oz) 119.296 kg (263 lb) 118.174 kg (260 lb 8.4 oz)    History of present illness:  64 yo male with htn, hyperlipidemia, dm2 c/o dyspnea, Started last nite. + dyspnea with exertion. + orthopnea, Slight weight gain over the past few months. + dry cough last nite, with slight wheezing last nite. Denies fever, chills, cp, palp, n/v. 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 Improved with IV lasix and quickly improved.  Echo with EF 40% grade 2 diastolic dysfunction. Troponin negative x3, EKG without acute changes, no chest pain. Chest xray no acute findings. Evaluated by cardiology who opine in setting of poorly controlled HTN "likley hypertensive CM". Recommended changing atenolol to coreg, continue ACE-I and amlodipine and maxide and adding prn lasix. Oxygen saturation level 96% on room air with ambulation at discharge.   Hypokalemia: potassium 3.1.  repleted. Recommend BMET  07/17/15  Hyponatremia: mild. Given IV fluids. Sodium 134 at discharge  Dm2: a1c 8.1. Recommend close OP follow up with PCP for optimal control.    Procedures:  echo - Left ventricle: The cavity size was normal. Wall thickness was increased in a pattern of mild LVH. Systolic function was mildly to moderately reduced. The estimated ejection fraction was = 40%. No regional wall motion abnormalities. Features are consistent with a pseudonormal left ventricular filling pattern, with concomitant abnormal relaxation and increased filling pressure (grade 2 diastolic dysfunction).  Consultations:  cardiology  Discharge Exam: Filed Vitals:   07/14/15 0411  BP: 140/77  Pulse: 72  Temp: 99.3 F (37.4 C)  Resp: 20    General: obese appears comfortable Cardiovascular: RRR no MGR no LE edema Respiratory: normal effort ambulating in room. BS distant in bases. No crackles  Discharge Instructions   Discharge Instructions    Diet - low sodium heart healthy    Complete by:  As directed      Discharge instructions    Complete by:  As directed   Take medications as directed Follow up with cardiology and PCP as scheduled     Increase activity slowly    Complete by:  As directed           Current Discharge Medication List    START taking these medications   Details  carvedilol (COREG) 12.5 MG tablet Take 1 tablet (12.5 mg total) by mouth 2 (two) times daily with a meal. Qty: 60 tablet, Refills: 1    furosemide (LASIX) 40 MG tablet Take 1 tablet (40 mg total) by mouth daily as  needed for edema. And or shortness of breath Qty: 60 tablet, Refills: 0    potassium chloride SA (K-DUR,KLOR-CON) 20 MEQ tablet Take 1 tablet (20 mEq total) by mouth daily. Qty: 30 tablet, Refills: 1      CONTINUE these medications which have CHANGED   Details  triamterene-hydrochlorothiazide (MAXZIDE) 75-50 MG per tablet Take 1 tablet by mouth daily. Qty: 30 tablet, Refills: 0       CONTINUE these medications which have NOT CHANGED   Details  amLODipine-benazepril (LOTREL) 10-20 MG per capsule Take 1 capsule by mouth daily. Qty: 90 capsule, Refills: 1    aspirin EC 81 MG tablet Take 81 mg by mouth daily.    finasteride (PROSCAR) 5 MG tablet Take 1 tablet (5 mg total) by mouth daily. Qty: 30 tablet, Refills: 5    glipiZIDE (GLUCOTROL) 5 MG tablet Take two tablets in the morning and one tablet in the evening Qty: 60 tablet, Refills: 5   Associated Diagnoses: Type 2 diabetes mellitus without complication    sildenafil (VIAGRA) 100 MG tablet Take 100 mg by mouth as needed for erectile dysfunction.    glucose blood (ONE TOUCH ULTRA TEST) test strip TEST BLOOD SUGAR ONCE DAILY AS DIRECTED **DX E11.9 Qty: 50 each, Refills: 01      STOP taking these medications     atenolol (TENORMIN) 50 MG tablet        No Known Allergies Follow-up Information    Follow up with Lubertha South, MD.   Specialty:  [facility]   Contact information:   [address] [place] [address] [phone]       Follow up On 07/17/2015.   Why:  appointment 1pm      Follow up with Jacolyn Reedy, PA-C.   Specialty:  Cardiology   Why:  Appointment with [facility] Care in [facility]. July 20th at 1:40pm   Contact information:   [address] [place] [address] [phone]        The results of significant diagnostics from this hospitalization (including imaging, microbiology, ancillary and laboratory) are listed below for reference.    Significant Diagnostic Studies: Dg Chest 2 View  07/12/2015   CLINICAL DATA:  Increasing short breath since Monday  EXAM: CHEST  2 VIEW  COMPARISON:  Radiograph 06/20/2014  FINDINGS: Normal cardiac silhouette. There is chronic elevation left hemidiaphragm. Mild bronchitic markings. This atelectasis left lung base. Osteophytosis of the spine.  IMPRESSION: 1. No acute cardiopulmonary findings. 2. Elevation  of the left hemidiaphragm with chronic left basilar atelectasis.   Electronically Signed   By: Genevive Bi M.D.   On: 07/12/2015 18:55    Microbiology: No results found for this or any previous visit (from the past 240 hour(s)).   Labs: Basic Metabolic Panel:  Recent Labs Lab 07/12/15 1813 07/13/15 0333 07/13/15 0925 07/14/15 0639  NA 133* 133*  --  134*  K 3.4* 3.9  --  3.1*  CL 97* 97*  --  95*  CO2 26 25  --  29  GLUCOSE 232* 201*  --  159*  BUN 14 12  --  18  CREATININE 0.99 0.98  --  1.02  CALCIUM 8.8* 8.5*  --  8.6*  MG  --   --  2.0  --    Liver Function Tests:  Recent Labs Lab 07/12/15 1813 07/13/15 0333  AST 29 33  ALT 41 40  ALKPHOS 67 64  BILITOT 0.8 1.0  PROT 7.4 7.2  ALBUMIN 4.2 4.0   No results for input(s): LIPASE, AMYLASE in the last 168 hours. No results for input(s): AMMONIA in the last 168 hours. CBC:  Recent Labs Lab 07/12/15 1813 07/13/15 0333  WBC 10.9* 9.9  NEUTROABS 6.9 6.9  HGB 14.5 14.7  HCT 44.2 44.5  MCV 85.2 84.9  PLT 198 190   Cardiac Enzymes:  Recent Labs Lab 07/12/15 1813 07/12/15 2135 07/13/15 0333 07/13/15 0925  TROPONINI <0.03 0.03 <0.03 <0.03   BNP: BNP (last 3 results)  Recent Labs  07/12/15 1813  BNP 388.0*    ProBNP (last 3 results) No results for input(s): PROBNP in the last 8760 hours.  CBG:  Recent Labs Lab 07/13/15 1627 07/13/15 1746 07/13/15 2112 07/14/15 0755 07/14/15 1150  GLUCAP 163* 235* 199* 176* 288*       Signed:  Toya Smothers, NP  [facility] 07/14/2015, 1:39 PM   Attending note:  Patient seen and examined. Note reviewed as per Toya Smothers, NP  Patient was admitted to the hospital with shortness of breath, dyspnea on exertion. Echocardiogram showed ejection fraction of 40% with diastolic dysfunction. He was noted to be significantly hypertensive. 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 He received intravenous Lasix with good urine  output and improvement in shortness of breath. He is not able to ambulate without any significant shortness of breath. He is maintaining oxygen saturations on room air. Plans are for outpatient cardiology follow-up. He is otherwise stable for discharge.  Kiren Mcisaac

## 2015-07-15 NOTE — ED Provider Notes (Signed)
Admit to gen med  Donnetta HutchingBrian Destynee Stringfellow, MD 07/15/15 [id number]

## 2015-07-17 ENCOUNTER — Encounter: Payer: Self-pay | Admitting: Family Medicine

## 2015-07-17 ENCOUNTER — Ambulatory Visit (INDEPENDENT_AMBULATORY_CARE_PROVIDER_SITE_OTHER): Payer: Medicare Other | Admitting: Family Medicine

## 2015-07-17 VITALS — BP 118/78 | Ht 70.0 in | Wt 261.0 lb

## 2015-07-17 DIAGNOSIS — IMO0002 Reserved for concepts with insufficient information to code with codable children: Secondary | ICD-10-CM

## 2015-07-17 DIAGNOSIS — I10 Essential (primary) hypertension: Secondary | ICD-10-CM

## 2015-07-17 DIAGNOSIS — I5041 Acute combined systolic (congestive) and diastolic (congestive) heart failure: Secondary | ICD-10-CM

## 2015-07-17 DIAGNOSIS — E1165 Type 2 diabetes mellitus with hyperglycemia: Secondary | ICD-10-CM | POA: Diagnosis not present

## 2015-07-17 NOTE — Progress Notes (Signed)
   Subjective:    Patient ID: Jeremy Rivera, male    DOB: 12-15-1951, 64 y.o.   MRN: [id number]  HPIFollow up hospitalization. Went to ED for dyspnea.   Following up on BP and diabetes. Pt states blood sugar is still elevated. A1C 8.1 on 7/13. fbs today 158. Usually runs in the 120's in the am.  Long discussion held regarding diabetes this patient was taking in way too many starches and drinking regular sodas. The importance of healthy eating and avoiding sugary drinks was discussed. The importance of exercise was also discuss he has not been doing much of that Concerns about what strength aspirin he should be taking he is taking 325mg  currently. The patient states when he took 81 mg aspirin caused his left heel to her but now that is doing 325 it is no longer hurting   Review of Systems  Constitutional: Negative for activity change, appetite change and fatigue.  HENT: Negative for congestion.   Respiratory: Negative for cough.   Cardiovascular: Negative for chest pain.  Gastrointestinal: Negative for abdominal pain.  Endocrine: Negative for polydipsia and polyphagia.  Neurological: Negative for weakness.  Psychiatric/Behavioral: Negative for confusion.       Objective:   Physical Exam  Constitutional: He appears well-nourished. No distress.  Cardiovascular: Normal rate, regular rhythm and normal heart sounds.   No murmur heard. Pulmonary/Chest: Effort normal and breath sounds normal. No respiratory distress.  Musculoskeletal: He exhibits no edema.  Lymphadenopathy:    He has no cervical adenopathy.  Neurological: He is alert.  Psychiatric: His behavior is normal.  Vitals reviewed.   There is some trace edema in the ankles but otherwise her no crackles no gallops.  I reviewed over his medications while he was in the hospital as well as when he was discharged. I also reviewed over his lab work and his echo and explained CHF to the patient    Assessment & Plan:  Diabetes  subpar control patient states he has not been following a diabetic diet area his wife states that she is monitoring his food intake he is now doing a much better job and his sugars are staying below. They will check sugars frequently over the next 2 weeks they will forward the results to us. May need additional medications added may need adjustments in current medicines.  Hypertension blood pressure good control today importance of losing weight and keeping blood pressure under control discussed  I encouraged patient to go back to 81 mg of aspirin daily this would be safer less risk of gastric ulcer  25-30 minutes spent with patient  CHF we talked about warning signs regarding CHF we ALSO talked about daily weights and if weight goes up more than 45 pounds over several days to follow-up we also discussed the importance of avoiding excessive salt. In addition to this we discussed the importance of trying to lose 25 pounds over the next 6 months and compliance with medicines follow-up with Dr. Brett CanalesSteve in a proximally 6 weeks

## 2015-07-17 NOTE — Patient Instructions (Signed)
Diabetes Mellitus and Food It is important for you to manage your blood sugar (glucose) level. Your blood glucose level can be greatly affected by what you eat. Eating healthier foods in the appropriate amounts throughout the day at about the same time each day will help you control your blood glucose level. It can also help slow or prevent worsening of your diabetes mellitus. Healthy eating may even help you improve the level of your blood pressure and reach or maintain a healthy weight.  HOW CAN FOOD AFFECT ME? Carbohydrates Carbohydrates affect your blood glucose level more than any other type of food. Your dietitian will help you determine how many carbohydrates to eat at each meal and teach you how to count carbohydrates. Counting carbohydrates is important to keep your blood glucose at a healthy level, especially if you are using insulin or taking certain medicines for diabetes mellitus. Alcohol Alcohol can cause sudden decreases in blood glucose (hypoglycemia), especially if you use insulin or take certain medicines for diabetes mellitus. Hypoglycemia can be a life-threatening condition. Symptoms of hypoglycemia (sleepiness, dizziness, and disorientation) are similar to symptoms of having too much alcohol.  If your health care provider has given you approval to drink alcohol, do so in moderation and use the following guidelines:  Women should not have more than one drink per day, and men should not have more than two drinks per day. One drink is equal to:  12 oz of beer.  5 oz of wine.  1 oz of hard liquor.  Do not drink on an empty stomach.  Keep yourself hydrated. Have water, diet soda, or unsweetened iced tea.  Regular soda, juice, and other mixers might contain a lot of carbohydrates and should be counted. WHAT FOODS ARE NOT RECOMMENDED? As you make food choices, it is important to remember that all foods are not the same. Some foods have fewer nutrients per serving than other  foods, even though they might have the same number of calories or carbohydrates. It is difficult to get your body what it needs when you eat foods with fewer nutrients. Examples of foods that you should avoid that are high in calories and carbohydrates but low in nutrients include:  Trans fats (most processed foods list trans fats on the Nutrition Facts label).  Regular soda.  Juice.  Candy.  Sweets, such as cake, pie, doughnuts, and cookies.  Fried foods. WHAT FOODS CAN I EAT? Have nutrient-rich foods, which will nourish your body and keep you healthy. The food you should eat also will depend on several factors, including:  The calories you need.  The medicines you take.  Your weight.  Your blood glucose level.  Your blood pressure level.  Your cholesterol level. You also should eat a variety of foods, including:  Protein, such as meat, poultry, fish, tofu, nuts, and seeds (lean animal proteins are best).  Fruits.  Vegetables.  Dairy products, such as milk, cheese, and yogurt (low fat is best).  Breads, grains, pasta, cereal, rice, and beans.  Fats such as olive oil, trans fat-free margarine, canola oil, avocado, and olives. DOES EVERYONE WITH DIABETES MELLITUS HAVE THE SAME MEAL PLAN? Because every person with diabetes mellitus is different, there is not one meal plan that works for everyone. It is very important that you meet with a dietitian who will help you create a meal plan that is just right for you. Document Released: 09/12/2005 Document Revised: 12/21/2013 Document Reviewed: 11/12/2013 ExitCare Patient Information 2015 [employer]. This   information is not intended to replace advice given to you by your health care provider. Make sure you discuss any questions you have with your health care provider. DASH Eating Plan DASH stands for "Dietary Approaches to Stop Hypertension." The DASH eating plan is a healthy eating plan that has been shown to reduce high  blood pressure (hypertension). Additional health benefits may include reducing the risk of type 2 diabetes mellitus, heart disease, and stroke. The DASH eating plan may also help with weight loss. WHAT DO I NEED TO KNOW ABOUT THE DASH EATING PLAN? For the DASH eating plan, you will follow these general guidelines:  Choose foods with a percent daily value for sodium of less than 5% (as listed on the food label).  Use salt-free seasonings or herbs instead of table salt or sea salt.  Check with your health care provider or pharmacist before using salt substitutes.  Eat lower-sodium products, often labeled as "lower sodium" or "no salt added."  Eat fresh foods.  Eat more vegetables, fruits, and low-fat dairy products.  Choose whole grains. Look for the word "whole" as the first word in the ingredient list.  Choose fish and skinless chicken or turkey more often than red meat. Limit fish, poultry, and meat to 6 oz (170 g) each day.  Limit sweets, desserts, sugars, and sugary drinks.  Choose heart-healthy fats.  Limit cheese to 1 oz (28 g) per day.  Eat more home-cooked food and less restaurant, buffet, and fast food.  Limit fried foods.  Cook foods using methods other than frying.  Limit canned vegetables. If you do use them, rinse them well to decrease the sodium.  When eating at a restaurant, ask that your food be prepared with less salt, or no salt if possible. WHAT FOODS CAN I EAT? Seek help from a dietitian for individual calorie needs. Grains Whole grain or whole wheat bread. Brown rice. Whole grain or whole wheat pasta. Quinoa, bulgur, and whole grain cereals. Low-sodium cereals. Corn or whole wheat flour tortillas. Whole grain cornbread. Whole grain crackers. Low-sodium crackers. Vegetables Fresh or frozen vegetables (raw, steamed, roasted, or grilled). Low-sodium or reduced-sodium tomato and vegetable juices. Low-sodium or reduced-sodium tomato sauce and paste. Low-sodium  or reduced-sodium canned vegetables.  Fruits All fresh, canned (in natural juice), or frozen fruits. Meat and Other Protein Products Ground beef (85% or leaner), grass-fed beef, or beef trimmed of fat. Skinless chicken or turkey. Ground chicken or turkey. Pork trimmed of fat. All fish and seafood. Eggs. Dried beans, peas, or lentils. Unsalted nuts and seeds. Unsalted canned beans. Dairy Low-fat dairy products, such as skim or 1% milk, 2% or reduced-fat cheeses, low-fat ricotta or cottage cheese, or plain low-fat yogurt. Low-sodium or reduced-sodium cheeses. Fats and Oils Tub margarines without trans fats. Light or reduced-fat mayonnaise and salad dressings (reduced sodium). Avocado. Safflower, olive, or canola oils. Natural peanut or almond butter. Other Unsalted popcorn and pretzels. The items listed above may not be a complete list of recommended foods or beverages. Contact your dietitian for more options. WHAT FOODS ARE NOT RECOMMENDED? Grains White bread. White pasta. White rice. Refined cornbread. Bagels and croissants. Crackers that contain trans fat. Vegetables Creamed or fried vegetables. Vegetables in a cheese sauce. Regular canned vegetables. Regular canned tomato sauce and paste. Regular tomato and vegetable juices. Fruits Dried fruits. Canned fruit in light or heavy syrup. Fruit juice. Meat and Other Protein Products Fatty cuts of meat. Ribs, chicken wings, bacon, sausage, bologna, salami, chitterlings, fatback, hot   dogs, bratwurst, and packaged luncheon meats. Salted nuts and seeds. Canned beans with salt. Dairy Whole or 2% milk, cream, half-and-half, and cream cheese. Whole-fat or sweetened yogurt. Full-fat cheeses or blue cheese. Nondairy creamers and whipped toppings. Processed cheese, cheese spreads, or cheese curds. Condiments Onion and garlic salt, seasoned salt, table salt, and sea salt. Canned and packaged gravies. Worcestershire sauce. Tartar sauce. Barbecue sauce.  Teriyaki sauce. Soy sauce, including reduced sodium. Steak sauce. Fish sauce. Oyster sauce. Cocktail sauce. Horseradish. Ketchup and mustard. Meat flavorings and tenderizers. Bouillon cubes. Hot sauce. Tabasco sauce. Marinades. Taco seasonings. Relishes. Fats and Oils Butter, stick margarine, lard, shortening, ghee, and bacon fat. Coconut, palm kernel, or palm oils. Regular salad dressings. Other Pickles and olives. Salted popcorn and pretzels. The items listed above may not be a complete list of foods and beverages to avoid. Contact your dietitian for more information. WHERE CAN I FIND MORE INFORMATION? National Heart, Lung, and Blood Institute: www.nhlbi.nih.gov/health/health-topics/topics/dash/ Document Released: 12/05/2011 Document Revised: 05/02/2014 Document Reviewed: 10/20/2013 ExitCare Patient Information 2015 [employer]. This information is not intended to replace advice given to you by your health care provider. Make sure you discuss any questions you have with your health care provider.  

## 2015-07-19 ENCOUNTER — Ambulatory Visit (INDEPENDENT_AMBULATORY_CARE_PROVIDER_SITE_OTHER): Payer: Medicare Other | Admitting: Physician Assistant

## 2015-07-19 ENCOUNTER — Encounter: Payer: Self-pay | Admitting: Physician Assistant

## 2015-07-19 VITALS — BP 138/86 | HR 76 | Ht 71.0 in | Wt 259.0 lb

## 2015-07-19 DIAGNOSIS — I119 Hypertensive heart disease without heart failure: Secondary | ICD-10-CM | POA: Diagnosis not present

## 2015-07-19 DIAGNOSIS — I10 Essential (primary) hypertension: Secondary | ICD-10-CM | POA: Diagnosis not present

## 2015-07-19 DIAGNOSIS — Z136 Encounter for screening for cardiovascular disorders: Secondary | ICD-10-CM

## 2015-07-19 DIAGNOSIS — I5042 Chronic combined systolic (congestive) and diastolic (congestive) heart failure: Secondary | ICD-10-CM | POA: Diagnosis not present

## 2015-07-19 DIAGNOSIS — I429 Cardiomyopathy, unspecified: Secondary | ICD-10-CM

## 2015-07-19 DIAGNOSIS — I43 Cardiomyopathy in diseases classified elsewhere: Secondary | ICD-10-CM

## 2015-07-19 NOTE — Patient Instructions (Signed)
Your physician recommends that you schedule a follow-up appointment in: 1-2 months with Dr. Wyline MoodBranch  Your physician recommends that you continue on your current medications as directed. Please refer to the Current Medication list given to you today.  You have been given a copy of a low salt diet.   Thank you for choosing [facility]!

## 2015-07-19 NOTE — Assessment & Plan Note (Signed)
Patient has hypertensive cardiomyopathy EF of 40% with grade 2 diastolic dysfunction. He is well compensated today. He is compliant with his medications and watching his diet closely. He took an extra Lasix last night thinking it would help him sleep but it kept him up all night long. His weight is down 2 pounds from when he saw Dr. Gerda DissLuking on Monday. He is due for a Bmet today. Continue current medications. Follow-up with Dr.Branch in 2 months.

## 2015-07-19 NOTE — Assessment & Plan Note (Signed)
Heart failure compensated. Continue 2 g sodium diet and current medications.

## 2015-07-19 NOTE — Assessment & Plan Note (Signed)
Blood pressure stable ? ?

## 2015-07-19 NOTE — Progress Notes (Signed)
Cardiology Office Note   Date:  07/19/2015   ID:  Jeremy Rivera, DOB 1951/09/30, MRN [id number]  PCP:  Lubertha South, MD  Cardiologist: Dr. Wyline Mood  Chief Complaint: Post hospital follow-up    History of Present Illness: Jeremy Rivera is a 64 y.o. male who presents for post hospital follow-up. He was admitted to the hospital with mild heart failure. He has history of hypertension, diabetes mellitus and irregular heartbeat since the 1980s. He had an echo that showed mild LV systolic dysfunction EF 40% with grade 2 diastolic dysfunction. This was most likely a cause of his DOE. In the setting of poorly controlled hypertension this is likely a hypertensive cardiomyopathy. Because of the absence of chest pain and no focal wall motion abnormalities and only mild systolic dysfunction Dr. branch recommended blood pressure control medical therapy as opposed to ischemic testing at this time. He was treated with Coreg and ACE inhibitor as well as when necessary Lasix.  Patient comes in today feeling quite well. He says he awakened last night from a dream and decided to take a Lasix because he thought it would help him sleep. Unfortunately he was up urinating 12 times and he didn't sleep at all. He says he really wasn't short of breath and isn't sure why he took this. He says he didn't need it for his breathing. He is watching his diet closely. His wife is monitoring his blood pressure and it's been around 118/78. He is taken all his medications regularly. He denies any chest pain, dyspnea, dyspnea on exertion, dizziness or presyncope.      Past Medical History  Diagnosis Date  . Hypertension   . Diabetes mellitus without complication   . ED (erectile dysfunction)   . Prostate hypertrophy   . Herniated lumbar intervertebral disc   . Irregular heart beat     a. Pt does not know official diagnosis.  . Diastolic dysfunction     grade 2    Past Surgical History  Procedure Laterality Date  . Back  surgery  1994 Dr. Roxan Hockey  . Back surgery  1994 Dr. Roxan Hockey  . Colonoscopy      one polyp  . Lumbar laminectomy/decompression microdiscectomy Left 06/22/2014    Procedure: LUMBAR LAMINECTOMY/DECOMPRESSION MICRODISCECTOMY 1 LEVEL;  Surgeon: Carmela Hurt, MD;  Location: [facility];  Service: Neurosurgery;  Laterality: Left;  Redo Left Lumbar Three-Four Microdiskectomy     Current Outpatient Prescriptions  Medication Sig Dispense Refill  . amLODipine-benazepril (LOTREL) 10-20 MG per capsule Take 1 capsule by mouth daily. 90 capsule 1  . aspirin EC 81 MG tablet Take 81 mg by mouth daily.    . carvedilol (COREG) 12.5 MG tablet Take 1 tablet (12.5 mg total) by mouth 2 (two) times daily with a meal. 60 tablet 1  . finasteride (PROSCAR) 5 MG tablet Take 1 tablet (5 mg total) by mouth daily. 30 tablet 5  . furosemide (LASIX) 40 MG tablet Take 1 tablet (40 mg total) by mouth daily as needed for edema. And or shortness of breath 60 tablet 0  . glipiZIDE (GLUCOTROL) 5 MG tablet Take two tablets in the morning and one tablet in the evening (Patient taking differently: Take 5-10 mg by mouth 2 (two) times daily before a meal. Take two tablets in the morning and one tablet in the evening) 60 tablet 5  . glucose blood (ONE TOUCH ULTRA TEST) test strip TEST BLOOD SUGAR ONCE DAILY AS DIRECTED **DX E11.9 50 each 01  .  potassium chloride SA (K-DUR,KLOR-CON) 20 MEQ tablet Take 1 tablet (20 mEq total) by mouth daily. 30 tablet 1  . sildenafil (VIAGRA) 100 MG tablet Take 100 mg by mouth as needed for erectile dysfunction.    . triamterene-hydrochlorothiazide (MAXZIDE) 75-50 MG per tablet Take 1 tablet by mouth daily. 30 tablet 0   No current facility-administered medications for this visit.    Allergies:   Review of patient's allergies indicates no known allergies.    Social History:  The patient  reports that he has never smoked. He has never used smokeless tobacco. He reports that he does not drink  alcohol or use illicit drugs.   Family History:  The patient's    family history includes COPD in his brother; Depression in his father; Diabetes in his mother; Hypertension in his mother; Throat cancer in his sister; Transient ischemic attack in his mother. There is no history of Colon cancer.    ROS:  Please see the history of present illness.   Otherwise, review of systems are positive for none.   All other systems are reviewed and negative.    PHYSICAL EXAM: VS:  Ht 5\' 11"  (1.803 m)  Wt 259 lb (117.482 kg)  BMI 36.14 kg/m2 , BMI Body mass index is 36.14 kg/(m^2). GEN:  obese, well developed, in no acute distress Neck: no JVD, HJR, carotid bruits, or masses Cardiac:  RRR; distant heart sounds no murmurs,gallop, rubs, thrill or heave,  Respiratory:  clear to auscultation bilaterally, normal work of breathing GI: soft, nontender, nondistended, + BS MS: no deformity or atrophy Extremities: without cyanosis, clubbing, edema, good distal pulses bilaterally.  Skin: warm and dry, no rash Neuro:  Strength and sensation are intact    EKG:  EKG is ordered today. The ekg ordered today demonstrates normal sinus rhythm with LVH nonspecific ST-T wave abnormalities, no acute change  Recent Labs: 07/12/2015: B Natriuretic Peptide 388.0*; TSH 1.382 07/13/2015: ALT 40; Hemoglobin 14.7; Magnesium 2.0; Platelets 190 07/14/2015: BUN 18; Creatinine, Ser 1.02; Potassium 3.1*; Sodium 134*    Lipid Panel    Component Value Date/Time   CHOL 174 07/14/2015 0640   TRIG 124 07/14/2015 0640   HDL 42 07/14/2015 0640   CHOLHDL 4.1 07/14/2015 0640   VLDL 25 07/14/2015 0640   LDLCALC 107* 07/14/2015 0640      Wt Readings from Last 3 Encounters:  07/19/15 259 lb (117.482 kg)  07/17/15 261 lb (118.389 kg)  07/14/15 260 lb 8.4 oz (118.174 kg)      Other studies Reviewed: Additional studies/ records that were reviewed today include and review of the records demonstrates:  2-D echo 07/13/15 Study  Conclusions  - Left ventricle: The cavity size was normal. Wall thickness was   increased in a pattern of mild LVH. Systolic function was mildly   to moderately reduced. The estimated ejection fraction was = 40%.   No regional wall motion abnormalities. Features are consistent   with a pseudonormal left ventricular filling pattern, with   concomitant abnormal relaxation and increased filling pressure   (grade 2 diastolic dysfunction). - Aortic valve: Mildly calcified annulus. Trileaflet; mildly   thickened leaflets. Valve area (VTI): 2.41 cm^2. Valve area   (Vmax): 2.49 cm^2. - Mitral valve: There was mild regurgitation. - Left atrium: The atrium was moderately dilated. - Pulmonary veins: There is blunting of systolic pulmonary vein   flow consistent with elevated LA pressures. - Atrial septum: No defect or patent foramen ovale was identified. - Technically difficult study.  Echo contrast was used to enhance   visualization.    ASSESSMENT AND PLAN: Hypertensive cardiomyopathy Patient has hypertensive cardiomyopathy EF of 40% with grade 2 diastolic dysfunction. He is well compensated today. He is compliant with his medications and watching his diet closely. He took an extra Lasix last night thinking it would help him sleep but it kept him up all night long. His weight is down 2 pounds from when he saw Dr. Gerda DissLuking on Monday. He is due for a Bmet today. Continue current medications. Follow-up with Dr.Branch in 2 months.  Essential hypertension Blood pressure stable  Chronic combined systolic and diastolic CHF, NYHA class 2 Heart failure compensated. Continue 2 g sodium diet and current medications.     Signed, Jacolyn ReedyMichele Lenze, PA-C  07/19/2015 1:45 PM    [facility] [address], [place], [place]  [address] Phone: [phone]; Fax: [phone]

## 2015-07-21 ENCOUNTER — Encounter: Payer: Self-pay | Admitting: Family Medicine

## 2015-07-21 DIAGNOSIS — I10 Essential (primary) hypertension: Secondary | ICD-10-CM | POA: Diagnosis not present

## 2015-07-22 LAB — BASIC METABOLIC PANEL
BUN / CREAT RATIO: 14 (ref 10–22)
BUN: 25 mg/dL (ref 8–27)
CHLORIDE: 94 mmol/L — AB (ref 97–108)
CO2: 27 mmol/L (ref 18–29)
Calcium: 9.8 mg/dL (ref 8.6–10.2)
Creatinine, Ser: 1.76 mg/dL — ABNORMAL HIGH (ref 0.76–1.27)
GFR calc Af Amer: 46 mL/min/{1.73_m2} — ABNORMAL LOW (ref >59)
GFR calc non Af Amer: 39 mL/min/{1.73_m2} — ABNORMAL LOW (ref >59)
Glucose: 143 mg/dL — ABNORMAL HIGH (ref 65–99)
Potassium: 4.4 mmol/L (ref 3.5–5.2)
SODIUM: 139 mmol/L (ref 134–144)

## 2015-08-04 MED ORDER — TRIAMTERENE-HCTZ 37.5-25 MG PO TABS: 1.0000 | ORAL_TABLET | Freq: Every day | ORAL | Status: DC

## 2015-08-04 NOTE — Addendum Note (Signed)
Addended by: Margaretha Sheffield on: 08/04/2015 11:09 AM   Modules accepted: Orders

## 2015-08-17 DIAGNOSIS — E1165 Type 2 diabetes mellitus with hyperglycemia: Secondary | ICD-10-CM | POA: Diagnosis not present

## 2015-08-17 DIAGNOSIS — I10 Essential (primary) hypertension: Secondary | ICD-10-CM | POA: Diagnosis not present

## 2015-08-18 LAB — BASIC METABOLIC PANEL
BUN/Creatinine Ratio: 17 (ref 10–22)
BUN: 23 mg/dL (ref 8–27)
CO2: 26 mmol/L (ref 18–29)
CREATININE: 1.35 mg/dL — AB (ref 0.76–1.27)
Calcium: 9.6 mg/dL (ref 8.6–10.2)
Chloride: 97 mmol/L (ref 97–108)
GFR calc Af Amer: 63 mL/min/{1.73_m2} (ref >59)
GFR, EST NON AFRICAN AMERICAN: 54 mL/min/{1.73_m2} — AB (ref >59)
Glucose: 136 mg/dL — ABNORMAL HIGH (ref 65–99)
Potassium: 4 mmol/L (ref 3.5–5.2)
Sodium: 140 mmol/L (ref 134–144)

## 2015-08-24 NOTE — H&P (Signed)
Jeremy Rivera is an 64 y.o. male.   Chief Complaint: dyspnea HPI: 64 yo male with htn, hyperlipidemia, dm2 c/o dyspnea, Started last nite. + dyspnea with exertion. + orthopnea, Slight weight gain over the past few months. + dry cough last nite, with slight wheezing last nite. Denies fever, chills, cp, palp, n/v. Pt presented to office and pt was sent to ED for evaluation   Past Medical History  Diagnosis Date  . Hypertension   . Diabetes mellitus without complication   . ED (erectile dysfunction)   . Prostate hypertrophy   . Herniated lumbar intervertebral disc   . Irregular heart beat     a. Pt does not know official diagnosis.  . Diastolic dysfunction     grade 2    Past Surgical History  Procedure Laterality Date  . Back surgery  1994 Dr. Roxan Hockey  . Back surgery  1994 Dr. Roxan Hockey  . Colonoscopy      one polyp  . Lumbar laminectomy/decompression microdiscectomy Left 06/22/2014    Procedure: LUMBAR LAMINECTOMY/DECOMPRESSION MICRODISCECTOMY 1 LEVEL;  Surgeon: Carmela Hurt, MD;  Location: [facility];  Service: Neurosurgery;  Laterality: Left;  Redo Left Lumbar Three-Four Microdiskectomy    Family History  Problem Relation Age of Onset  . Hypertension Mother   . Diabetes Mother   . Colon cancer Neg Hx   . Transient ischemic attack Mother     Died of ministrokes at 30  . Depression Father     Died of "grieving"  . COPD Brother     Died of COPD  . Throat cancer Sister     Died of throat CA   Social History:  reports that he has never smoked. He has never used smokeless tobacco. He reports that he does not drink alcohol or use illicit drugs.  Allergies: No Known Allergies  Medications reviewed  Review of Systems  Constitutional: Negative.   HENT: Negative.   Eyes: Negative.   Respiratory: Positive for shortness of breath. Negative for cough, hemoptysis, sputum production and wheezing.   Cardiovascular: Negative for chest pain, palpitations, orthopnea,  claudication, leg swelling and PND.  Gastrointestinal: Negative.   Genitourinary: Negative.   Musculoskeletal: Negative.   Skin: Negative.   Neurological: Negative.   Endo/Heme/Allergies: Negative.   Psychiatric/Behavioral: Negative.     Blood pressure 140/77, pulse 72, temperature 99.3 F (37.4 C), temperature source Oral, resp. rate 20, height 5\' 11"  (1.803 m), weight 118.174 kg (260 lb 8.4 oz), SpO2 92 %. Physical Exam  Constitutional: He is oriented to person, place, and time. He appears well-developed and well-nourished.  HENT:  Head: Normocephalic and atraumatic.  Eyes: Conjunctivae and EOM are normal. Pupils are equal, round, and reactive to light. No scleral icterus.  Neck: Normal range of motion. Neck supple. No JVD present. No tracheal deviation present. No thyromegaly present.  Cardiovascular: Normal rate and regular rhythm.  Exam reveals no gallop and no friction rub.   Murmur heard. Respiratory: Effort normal and breath sounds normal. No respiratory distress. He has no wheezes. He has no rales.  GI: Soft. Bowel sounds are normal. He exhibits no distension. There is no tenderness. There is no rebound and no guarding.  Musculoskeletal: Normal range of motion. He exhibits no edema or tenderness.  Lymphadenopathy:    He has no cervical adenopathy.  Neurological: He is alert and oriented to person, place, and time. He has normal reflexes. He displays normal reflexes. No cranial nerve deficit. He exhibits normal muscle tone.  Coordination normal.  Skin: Skin is warm and dry. No rash noted. No erythema. No pallor.  Psychiatric: He has a normal mood and affect. His behavior is normal. Thought content normal.     Assessment/Plan Dyspnea ? CHF vs cardiomyopathy Check trop i q6h x3 Check tsh Check cardiac echo Lasix iv Cardiology consultation to see if stress testing would be beneficial  Hyperlipidemia Cont current medication  Dm2 fsbs ac and  qhs iss  Hypokalemia repleted  Hyponatremia Check cmp in am   Pearson Grippe 08/24/2015, 9:00 AM

## 2015-08-25 ENCOUNTER — Ambulatory Visit (INDEPENDENT_AMBULATORY_CARE_PROVIDER_SITE_OTHER): Payer: Medicare Other | Admitting: Cardiology

## 2015-08-25 VITALS — BP 142/70 | HR 84 | Ht 71.0 in | Wt 264.0 lb

## 2015-08-25 DIAGNOSIS — I5022 Chronic systolic (congestive) heart failure: Secondary | ICD-10-CM

## 2015-08-25 MED ORDER — CARVEDILOL 25 MG PO TABS: 25.0000 mg | ORAL_TABLET | Freq: Two times a day (BID) | ORAL | Status: DC

## 2015-08-25 NOTE — Progress Notes (Signed)
Patient ID: YASHUA BRACCO, male   DOB: June 13, 1951, 64 y.o.   MRN: [id number]     Clinical Summary Mr. Stoll is a 64 y.o.male seen today for follow up of the following medical problems. This is a focused visit on his history of chronic systolic HF, for more detailed history please refer to prior notes.   1. Chronic systolic HF - echo 06/2015 LVEF 40%, grade II diastolic dysfunction.  - denies any SOB or DOE. No LE edema - compliant with meds   Past Medical History  Diagnosis Date  . Hypertension   . Diabetes mellitus without complication   . ED (erectile dysfunction)   . Prostate hypertrophy   . Herniated lumbar intervertebral disc   . Irregular heart beat     a. Pt does not know official diagnosis.  . Diastolic dysfunction     grade 2     No Known Allergies   Current Outpatient Prescriptions  Medication Sig Dispense Refill  . amLODipine-benazepril (LOTREL) 10-20 MG per capsule Take 1 capsule by mouth daily. 90 capsule 1  . aspirin EC 81 MG tablet Take 81 mg by mouth daily.    . carvedilol (COREG) 12.5 MG tablet Take 1 tablet (12.5 mg total) by mouth 2 (two) times daily with a meal. 60 tablet 1  . finasteride (PROSCAR) 5 MG tablet Take 1 tablet (5 mg total) by mouth daily. 30 tablet 5  . furosemide (LASIX) 40 MG tablet Take 1 tablet (40 mg total) by mouth daily as needed for edema. And or shortness of breath 60 tablet 0  . glipiZIDE (GLUCOTROL) 5 MG tablet Take two tablets in the morning and one tablet in the evening (Patient taking differently: Take 5-10 mg by mouth 2 (two) times daily before a meal. Take two tablets in the morning and one tablet in the evening) 60 tablet 5  . glucose blood (ONE TOUCH ULTRA TEST) test strip TEST BLOOD SUGAR ONCE DAILY AS DIRECTED **DX E11.9 50 each 01  . potassium chloride SA (K-DUR,KLOR-CON) 20 MEQ tablet Take 1 tablet (20 mEq total) by mouth daily. 30 tablet 1  . sildenafil (VIAGRA) 100 MG tablet Take 100 mg by mouth as needed for erectile  dysfunction.    . triamterene-hydrochlorothiazide (MAXZIDE-25) 37.5-25 MG per tablet Take 1 tablet by mouth daily. 30 tablet 3   No current facility-administered medications for this visit.     Past Surgical History  Procedure Laterality Date  . Back surgery  1994 Dr. Roxan Hockey  . Back surgery  1994 Dr. Roxan Hockey  . Colonoscopy      one polyp  . Lumbar laminectomy/decompression microdiscectomy Left 06/22/2014    Procedure: LUMBAR LAMINECTOMY/DECOMPRESSION MICRODISCECTOMY 1 LEVEL;  Surgeon: Carmela Hurt, MD;  Location: [facility];  Service: Neurosurgery;  Laterality: Left;  Redo Left Lumbar Three-Four Microdiskectomy     No Known Allergies    Family History  Problem Relation Age of Onset  . Hypertension Mother   . Diabetes Mother   . Colon cancer Neg Hx   . Transient ischemic attack Mother     Died of ministrokes at 92  . Depression Father     Died of "grieving"  . COPD Brother     Died of COPD  . Throat cancer Sister     Died of throat CA     Social History Mr. Mehlhoff reports that he has never smoked. He has never used smokeless tobacco. Mr. Townley reports that he does not drink alcohol.  Review of Systems CONSTITUTIONAL: No weight loss, fever, chills, weakness or fatigue.  HEENT: Eyes: No visual loss, blurred vision, double vision or yellow sclerae.No hearing loss, sneezing, congestion, runny nose or sore throat.  SKIN: No rash or itching.  CARDIOVASCULAR: per HPI RESPIRATORY: No shortness of breath, cough or sputum.  GASTROINTESTINAL: No anorexia, nausea, vomiting or diarrhea. No abdominal pain or blood.  GENITOURINARY: No burning on urination, no polyuria NEUROLOGICAL: No headache, dizziness, syncope, paralysis, ataxia, numbness or tingling in the extremities. No change in bowel or bladder control.  MUSCULOSKELETAL: No muscle, back pain, joint pain or stiffness.  LYMPHATICS: No enlarged nodes. No history of splenectomy.  PSYCHIATRIC: No history of depression  or anxiety.  ENDOCRINOLOGIC: No reports of sweating, cold or heat intolerance. No polyuria or polydipsia.  Marland Kitchen   Physical Examination Filed Vitals:   08/25/15 1416  BP: 142/70  Pulse: 84   Filed Vitals:   08/25/15 1416  Height:  (1.803 m)  Weight: 264 lb (119.75 kg)    Gen: resting comfortably, no acute distress HEENT: no scleral icterus, pupils equal round and reactive, no palptable cervical adenopathy,  CV: RRR, no m/r/g, no JVD Resp: Clear to auscultation bilaterally GI: abdomen is soft, non-tender, non-distended, normal bowel sounds, no hepatosplenomegaly MSK: extremities are warm, no edema.  Skin: warm, no rash Neuro:  no focal deficits Psych: appropriate affect   Diagnostic Studies 06/2015 echo Study Conclusions  - Left ventricle: The cavity size was normal. Wall thickness was increased in a pattern of mild LVH. Systolic function was mildly to moderately reduced. The estimated ejection fraction was = 40%. No regional wall motion abnormalities. Features are consistent with a pseudonormal left ventricular filling pattern, with concomitant abnormal relaxation and increased filling pressure (grade 2 diastolic dysfunction). - Aortic valve: Mildly calcified annulus. Trileaflet; mildly thickened leaflets. Valve area (VTI): 2.41 cm^2. Valve area (Vmax): 2.49 cm^2. - Mitral valve: There was mild regurgitation. - Left atrium: The atrium was moderately dilated. - Pulmonary veins: There is blunting of systolic pulmonary vein flow consistent with elevated LA pressures. - Atrial septum: No defect or patent foramen ovale was identified. - Technically difficult study. Echo contrast was used to enhance visualization.      Assessment and Plan  1. Chronic systolic HF - LVEF 40%, NYHA II. 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 If no improvement in function will need ischemic evaluation - appears euvolemic - will increase coreg to   bid   F/u 3-4 weeks      Antoine Poche, M.D.

## 2015-08-25 NOTE — Patient Instructions (Signed)
Your physician recommends that you schedule a follow-up appointment in:  3-4 weeks with Dr. Wyline Mood  Increase Coreg to 25 mg Two times Daily.  Thank you for choosing [facility]!

## 2015-08-26 ENCOUNTER — Encounter: Payer: Self-pay | Admitting: Cardiology

## 2015-08-28 ENCOUNTER — Ambulatory Visit (INDEPENDENT_AMBULATORY_CARE_PROVIDER_SITE_OTHER): Payer: Medicare Other | Admitting: Family Medicine

## 2015-08-28 ENCOUNTER — Encounter: Payer: Self-pay | Admitting: Family Medicine

## 2015-08-28 VITALS — BP 138/86 | Ht 71.0 in | Wt 262.0 lb

## 2015-08-28 DIAGNOSIS — E1165 Type 2 diabetes mellitus with hyperglycemia: Secondary | ICD-10-CM

## 2015-08-28 DIAGNOSIS — I10 Essential (primary) hypertension: Secondary | ICD-10-CM

## 2015-08-28 DIAGNOSIS — I43 Cardiomyopathy in diseases classified elsewhere: Secondary | ICD-10-CM

## 2015-08-28 DIAGNOSIS — I119 Hypertensive heart disease without heart failure: Secondary | ICD-10-CM | POA: Diagnosis not present

## 2015-08-28 DIAGNOSIS — I429 Cardiomyopathy, unspecified: Secondary | ICD-10-CM | POA: Diagnosis not present

## 2015-08-28 DIAGNOSIS — IMO0002 Reserved for concepts with insufficient information to code with codable children: Secondary | ICD-10-CM

## 2015-08-28 NOTE — Progress Notes (Signed)
   Subjective:    Patient ID: Jeremy Rivera, male    DOB: [birth year], 64 y.o.   MRN: [id number]  Hypertension This is a chronic problem. There are no compliance problems (pt doing some exercise, works in the yard).     saw Dr. Wyline Mood last Friday. Pt states dr branch added a new med. Pt is not sure if he was suppose to add it to what he is taking or if it was to replace med. Dr branch. This medicine was core red.   prescribed coreg. Pt has not picked up from pharm yet.  Had bloodwork bmp on 8/18. Pt would like results.  Pt states no concerns or problems.   Pt had a1c July 13. 8.1  Sugars overall running good, low 120s mostly, ck'ed yest after eating. 250 post prandial    Reports no current shortness of breath. He did have blood work that revealed substantially elevated creatinine. He had follow-up blood work and comes in to discuss that also today.  Review of Systems No headache no chest pain no change in bowel habits no blood in stool    Objective:   Physical Exam Alert vital stable HEENT normal lungs clear. Heart regular rate and rhythm. Ankles trace edema at most.       Assessment & Plan:  Impression 1 transient CHF analysis of cardiologist notes suggested working on hypertensive cardiomyopathy as etiology, however they're reserving right to assess for ischemia if patient does not improve on Coreg. I backed up this recommendation. Patient certainly has risk factors for ischemia encourage patient to follow-up closely with cardiologist #2 type 2 diabetes control good no not perfect #3 renal insufficiency creatinine improved. Will maintain current diarrhetic discussed plan as per orders follow-up as scheduled diet exercise discussed. WSL

## 2015-10-03 ENCOUNTER — Encounter: Payer: Self-pay | Admitting: Cardiology

## 2015-10-03 ENCOUNTER — Ambulatory Visit (INDEPENDENT_AMBULATORY_CARE_PROVIDER_SITE_OTHER): Payer: Medicare Other | Admitting: Cardiology

## 2015-10-03 VITALS — BP 142/80 | HR 78 | Ht 70.5 in | Wt 265.6 lb

## 2015-10-03 DIAGNOSIS — I5022 Chronic systolic (congestive) heart failure: Secondary | ICD-10-CM | POA: Diagnosis not present

## 2015-10-03 DIAGNOSIS — Z23 Encounter for immunization: Secondary | ICD-10-CM

## 2015-10-03 DIAGNOSIS — I10 Essential (primary) hypertension: Secondary | ICD-10-CM | POA: Diagnosis not present

## 2015-10-03 MED ORDER — AMLODIPINE BESY-BENAZEPRIL HCL 10-40 MG PO CAPS: 1.0000 | ORAL_CAPSULE | Freq: Every day | ORAL | Status: DC

## 2015-10-03 NOTE — Progress Notes (Signed)
Patient ID: DAWSON ALBERS, male   DOB: 1951/09/26, 64 y.o.   MRN: [id number]     Clinical Summary Mr. Luhman is a 64 y.o.male seen today for follow up of the following medical problems. This is a focused visit on his history of chronic systolic HF, for more detailed history please refer to prior notes.   1. Chronic systolic HF - echo 06/2015 LVEF 40%, grade II diastolic dysfunction.  - denies any SOB or DOE. No LE edema. Weights at home normally around 255. Our scales show he is stable around 264 lbs.  - compliant with meds. Tolerating well without side effects.    Past Medical History  Diagnosis Date  . Hypertension   . Diabetes mellitus without complication   . ED (erectile dysfunction)   . Prostate hypertrophy   . Herniated lumbar intervertebral disc   . Irregular heart beat     a. Pt does not know official diagnosis.  . Diastolic dysfunction     grade 2     No Known Allergies   Current Outpatient Prescriptions  Medication Sig Dispense Refill  . amLODipine-benazepril (LOTREL) 10-20 MG per capsule Take 1 capsule by mouth daily. 90 capsule 1  . aspirin EC 81 MG tablet Take 81 mg by mouth daily.    . carvedilol (COREG) 12.5 MG tablet TAKE 1 TABLET BY MOUTH TWICE A DAY WITH A MEAL  1  . carvedilol (COREG) 25 MG tablet Take 1 tablet (25 mg total) by mouth 2 (two) times daily with a meal. 60 tablet 6  . finasteride (PROSCAR) 5 MG tablet Take 1 tablet (5 mg total) by mouth daily. (Patient not taking: Reported on 08/28/2015) 30 tablet 5  . furosemide (LASIX) 40 MG tablet Take 1 tablet (40 mg total) by mouth daily as needed for edema. And or shortness of breath 60 tablet 0  . glipiZIDE (GLUCOTROL) 5 MG tablet Take two tablets in the morning and one tablet in the evening (Patient taking differently: Take 5-10 mg by mouth 2 (two) times daily before a meal. Take two tablets in the morning and one tablet in the evening) 60 tablet 5  . glucose blood (ONE TOUCH ULTRA TEST) test strip TEST  BLOOD SUGAR ONCE DAILY AS DIRECTED **DX E11.9 50 each 01  . potassium chloride SA (K-DUR,KLOR-CON) 20 MEQ tablet Take 1 tablet (20 mEq total) by mouth daily. 30 tablet 1  . sildenafil (VIAGRA) 100 MG tablet Take 100 mg by mouth as needed for erectile dysfunction.    . triamterene-hydrochlorothiazide (MAXZIDE-25) 37.5-25 MG per tablet Take 1 tablet by mouth daily. 30 tablet 3   No current facility-administered medications for this visit.     Past Surgical History  Procedure Laterality Date  . Back surgery  1994 Dr. Roxan Hockey  . Back surgery  1994 Dr. Roxan Hockey  . Colonoscopy      one polyp  . Lumbar laminectomy/decompression microdiscectomy Left 06/22/2014    Procedure: LUMBAR LAMINECTOMY/DECOMPRESSION MICRODISCECTOMY 1 LEVEL;  Surgeon: Carmela Hurt, MD;  Location: [facility];  Service: Neurosurgery;  Laterality: Left;  Redo Left Lumbar Three-Four Microdiskectomy     No Known Allergies    Family History  Problem Relation Age of Onset  . Hypertension Mother   . Diabetes Mother   . Colon cancer Neg Hx   . Transient ischemic attack Mother     Died of ministrokes at 17  . Depression Father     Died of "grieving"  . COPD Brother  Died of COPD  . Throat cancer Sister     Died of throat [place]     Social History Mr. Hartel reports that he has never smoked. He has never used smokeless tobacco. Mr. Durr reports that he does not drink alcohol.   Review of Systems CONSTITUTIONAL: No weight loss, fever, chills, weakness or fatigue.  HEENT: Eyes: No visual loss, blurred vision, double vision or yellow sclerae.No hearing loss, sneezing, congestion, runny nose or sore throat.  SKIN: No rash or itching.  CARDIOVASCULAR: per HPI RESPIRATORY: No shortness of breath, cough or sputum.  GASTROINTESTINAL: No anorexia, nausea, vomiting or diarrhea. No abdominal pain or blood.  GENITOURINARY: No burning on urination, no polyuria NEUROLOGICAL: No headache, dizziness, syncope, paralysis,  ataxia, numbness or tingling in the extremities. No change in bowel or bladder control.  MUSCULOSKELETAL: No muscle, back pain, joint pain or stiffness.  LYMPHATICS: No enlarged nodes. No history of splenectomy.  PSYCHIATRIC: No history of depression or anxiety.  ENDOCRINOLOGIC: No reports of sweating, cold or heat intolerance. No polyuria or polydipsia.  Marland Kitchen   Physical Examination Filed Vitals:   10/03/15 0926  BP: 142/80  Pulse: 78   Filed Vitals:   10/03/15 0926  Height: 5' 10.5" (1.791 m)  Weight: 265 lb 9.6 oz (120.475 kg)    Gen: resting comfortably, no acute distress HEENT: no scleral icterus, pupils equal round and reactive, no palptable cervical adenopathy,  CV: RRR, no m/r/g, no JVD Resp: Clear to auscultation bilaterally GI: abdomen is soft, non-tender, non-distended, normal bowel sounds, no hepatosplenomegaly MSK: extremities are warm, no edema.  Skin: warm, no rash Neuro:  no focal deficits Psych: appropriate affect   Diagnostic Studies 06/2015 echo Study Conclusions  - Left ventricle: The cavity size was normal. Wall thickness was increased in a pattern of mild LVH. Systolic function was mildly to moderately reduced. The estimated ejection fraction was = 40%. No regional wall motion abnormalities. Features are consistent with a pseudonormal left ventricular filling pattern, with concomitant abnormal relaxation and increased filling pressure (grade 2 diastolic dysfunction). - Aortic valve: Mildly calcified annulus. Trileaflet; mildly thickened leaflets. Valve area (VTI): 2.41 cm^2. Valve area (Vmax): 2.49 cm^2. - Mitral valve: There was mild regurgitation. - Left atrium: The atrium was moderately dilated. - Pulmonary veins: There is blunting of systolic pulmonary vein flow consistent with elevated LA pressures. - Atrial septum: No defect or patent foramen ovale was identified. - Technically difficult study. Echo contrast was used to  enhance visualization.    Assessment and Plan  1. Chronic systolic HF - LVEF 40%, NYHA II. 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 If no improvement in function will need ischemic evaluation - appears euvolemic - will increase benazepril to  daily, check BMET in 2 weeks   F/u 1 month      Antoine Poche, M.D

## 2015-10-03 NOTE — Patient Instructions (Signed)
Your physician recommends that you schedule a follow-up appointment in:  1 month with Dr Wyline Mood     INCREASE Lotrel to 10-40 mg daily    Lab: BMET in 2 weeks     Thank you for choosing [facility] !

## 2015-10-11 IMAGING — DX DG CHEST 2V
2 series · 2 of 2 positions shown · non-contrast
Comparison: Radiograph 06/20/2014

CLINICAL DATA: Increasing short breath since [REDACTED]

EXAM:
CHEST  2 VIEW

[chest pa]
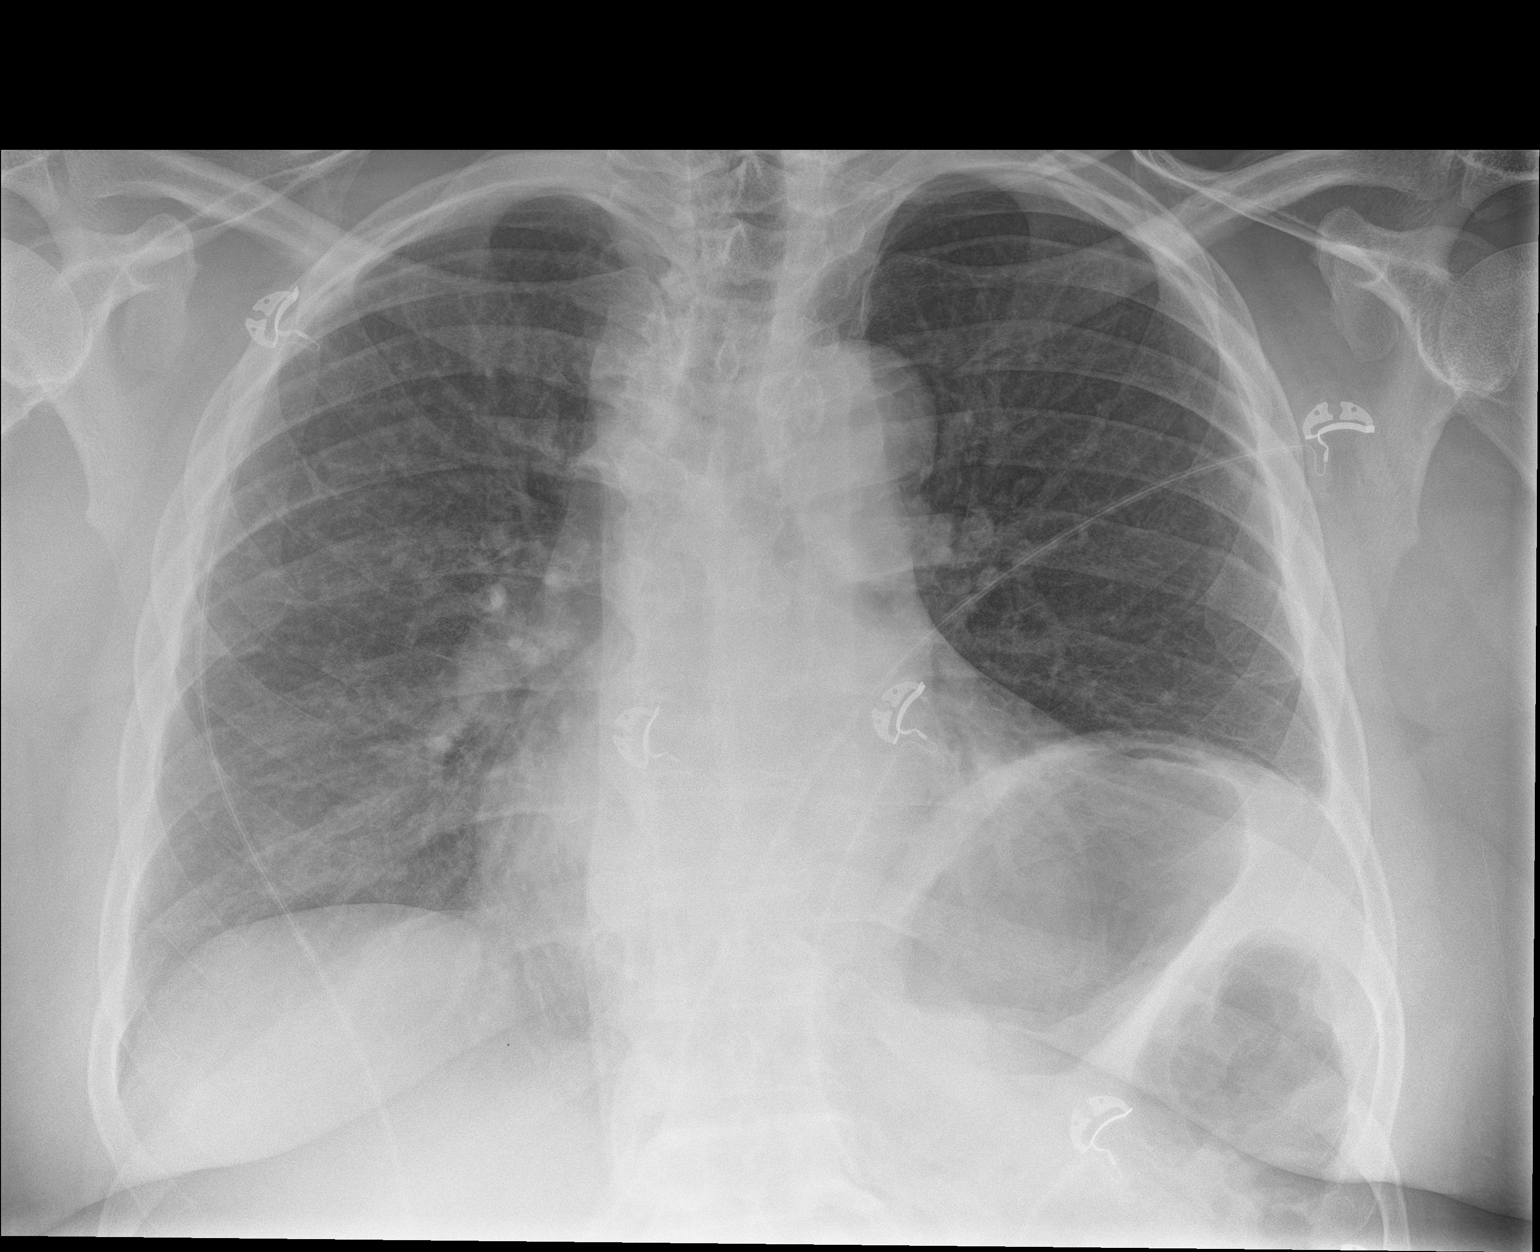

[chest lat]
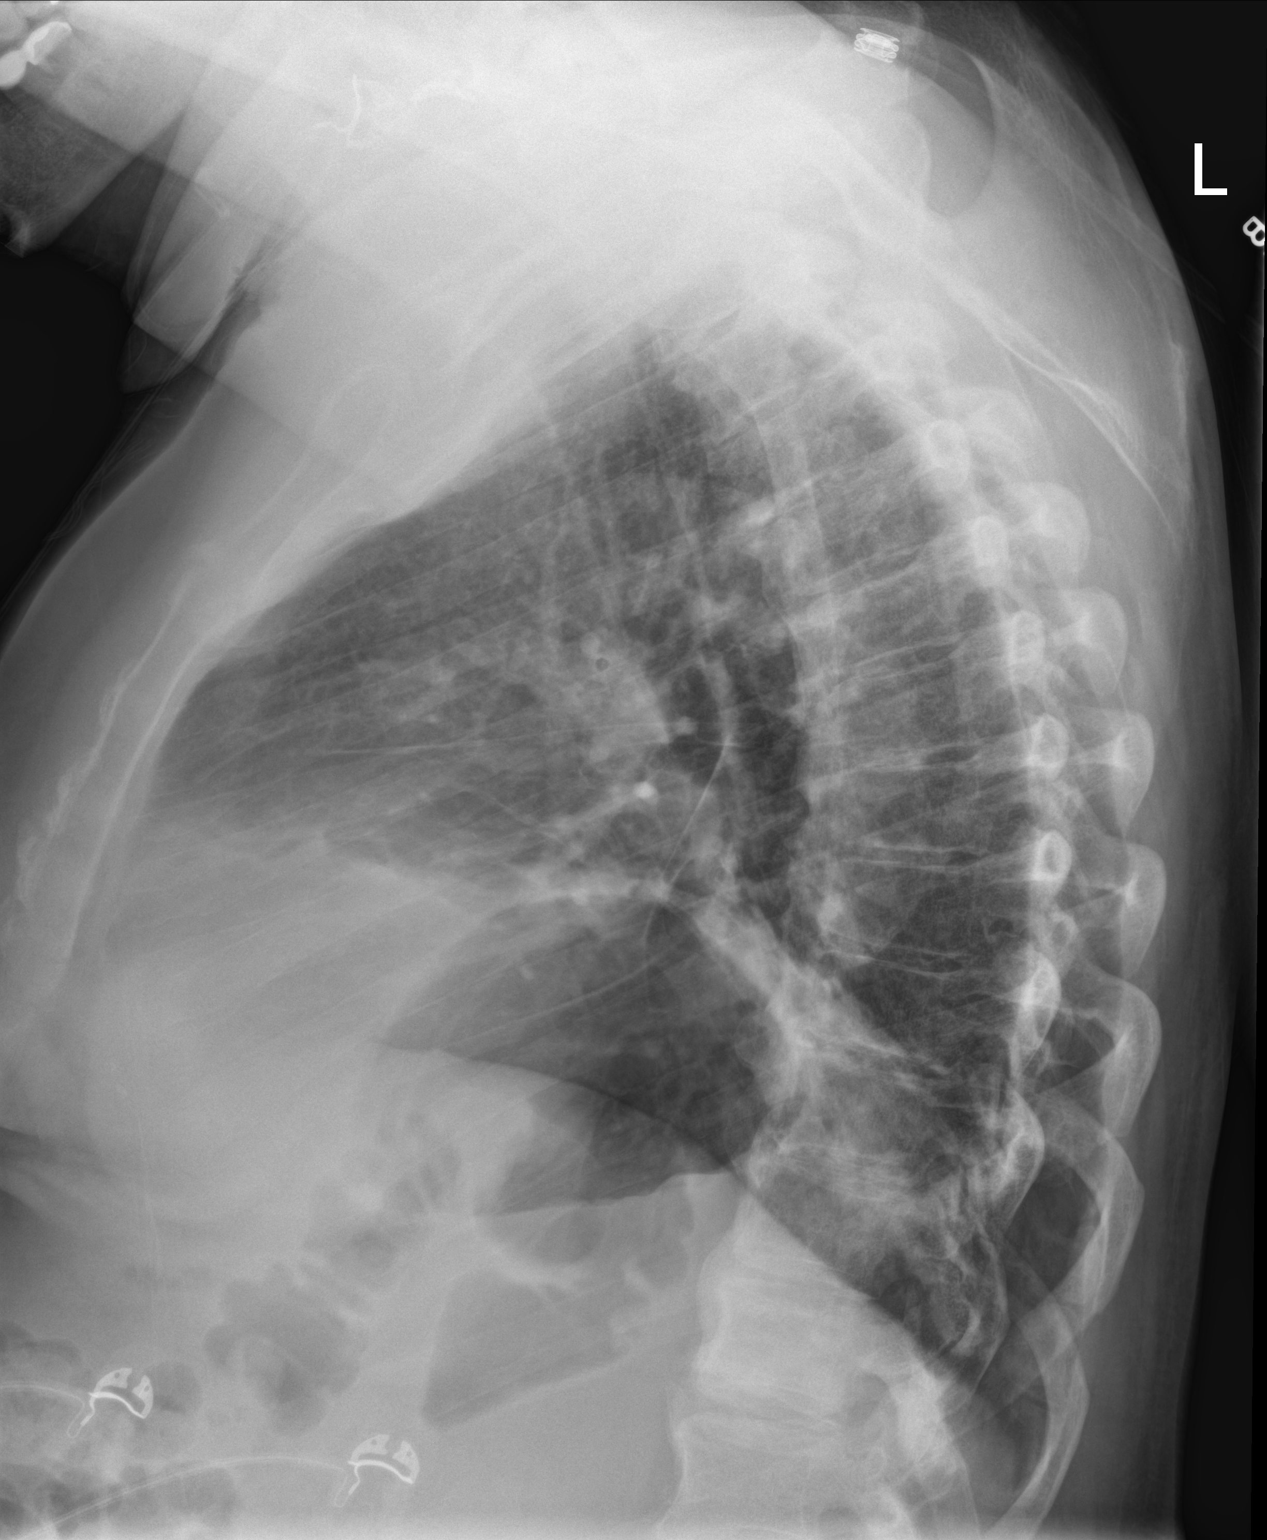

[2 of 2 positions shown; findings below may reference images not displayed]

FINDINGS: Normal cardiac silhouette. There is chronic elevation left
hemidiaphragm. Mild bronchitic markings. This atelectasis left lung
base. Osteophytosis of the spine.
IMPRESSION: 1. No acute cardiopulmonary findings.
2. Elevation of the left hemidiaphragm with chronic left basilar
atelectasis.

## 2015-10-16 DIAGNOSIS — N401 Enlarged prostate with lower urinary tract symptoms: Secondary | ICD-10-CM | POA: Diagnosis not present

## 2015-10-16 DIAGNOSIS — R972 Elevated prostate specific antigen [PSA]: Secondary | ICD-10-CM | POA: Diagnosis not present

## 2015-10-23 DIAGNOSIS — R351 Nocturia: Secondary | ICD-10-CM | POA: Diagnosis not present

## 2015-10-23 DIAGNOSIS — R972 Elevated prostate specific antigen [PSA]: Secondary | ICD-10-CM | POA: Diagnosis not present

## 2015-10-23 DIAGNOSIS — N401 Enlarged prostate with lower urinary tract symptoms: Secondary | ICD-10-CM | POA: Diagnosis not present

## 2015-10-23 DIAGNOSIS — R350 Frequency of micturition: Secondary | ICD-10-CM | POA: Diagnosis not present

## 2015-10-24 ENCOUNTER — Other Ambulatory Visit (HOSPITAL_COMMUNITY): Payer: Self-pay | Admitting: Urology

## 2015-10-24 DIAGNOSIS — R972 Elevated prostate specific antigen [PSA]: Secondary | ICD-10-CM

## 2015-10-26 ENCOUNTER — Other Ambulatory Visit: Payer: Self-pay | Admitting: Cardiology

## 2015-10-26 DIAGNOSIS — I10 Essential (primary) hypertension: Secondary | ICD-10-CM | POA: Diagnosis not present

## 2015-10-27 LAB — BASIC METABOLIC PANEL
BUN/Creatinine Ratio: 14 (ref 10–22)
BUN: 16 mg/dL (ref 8–27)
CALCIUM: 9.7 mg/dL (ref 8.6–10.2)
CO2: 29 mmol/L (ref 18–29)
CREATININE: 1.13 mg/dL (ref 0.76–1.27)
Chloride: 96 mmol/L — ABNORMAL LOW (ref 97–106)
GFR calc Af Amer: 78 mL/min/{1.73_m2} (ref >59)
GFR, EST NON AFRICAN AMERICAN: 67 mL/min/{1.73_m2} (ref >59)
Glucose: 118 mg/dL — ABNORMAL HIGH (ref 65–99)
POTASSIUM: 3.9 mmol/L (ref 3.5–5.2)
SODIUM: 139 mmol/L (ref 136–144)

## 2015-11-09 ENCOUNTER — Encounter: Payer: Self-pay | Admitting: Cardiology

## 2015-11-09 ENCOUNTER — Ambulatory Visit (INDEPENDENT_AMBULATORY_CARE_PROVIDER_SITE_OTHER): Payer: Medicare Other | Admitting: Cardiology

## 2015-11-09 ENCOUNTER — Other Ambulatory Visit (HOSPITAL_COMMUNITY): Payer: Self-pay | Admitting: Respiratory Therapy

## 2015-11-09 VITALS — BP 156/90 | HR 71 | Ht 71.0 in | Wt 263.0 lb

## 2015-11-09 DIAGNOSIS — Z79899 Other long term (current) drug therapy: Secondary | ICD-10-CM

## 2015-11-09 DIAGNOSIS — I10 Essential (primary) hypertension: Secondary | ICD-10-CM

## 2015-11-09 DIAGNOSIS — I5022 Chronic systolic (congestive) heart failure: Secondary | ICD-10-CM

## 2015-11-09 MED ORDER — CHLORTHALIDONE 25 MG PO TABS: 25.0000 mg | ORAL_TABLET | Freq: Every day | ORAL | Status: DC

## 2015-11-09 MED ORDER — SPIRONOLACTONE 25 MG PO TABS: 25.0000 mg | ORAL_TABLET | Freq: Every day | ORAL | Status: DC

## 2015-11-09 NOTE — Patient Instructions (Signed)
Medication Instructions:   START Aldactone 25 mg daily START CHLORTHALIDONE 25 MG DAILY DISCONTINUE MAXIDE   Labwork: Your physician recommends that you return for lab work in: 2 WEEKS BMET MAGNESIUM  Testing/Procedures: SLEEP STUDY  Follow-Up: Your physician recommends that you schedule a follow-up appointment in: 6 WEEKS WITH DR. BRANCH  Any Other Special Instructions Will Be Listed Below (If Applicable).        If you need a refill on your cardiac medications before your next appointment, please call your pharmacy.

## 2015-11-09 NOTE — Progress Notes (Signed)
Patient ID: Jeremy Rivera, male   DOB: 1951/09/13, 64 y.o.   MRN: [id number]     Clinical Summary Jeremy Rivera is a 64 y.o.male seen today for follow up of the following medical problems.   1. Chronic systolic HF - echo 06/2015 LVEF 40%, grade II diastolic dysfunction.  - denies any SOB or DOE. No LE edema. .   2. HTN - compliant with meds - checks at home occasionally. BP at home usually around 140s/80s  3. OSA + snoring, + apneic episodes. Limited daytime somnolence.   Past Medical History  Diagnosis Date  . Hypertension   . Diabetes mellitus without complication (HCC)   . ED (erectile dysfunction)   . Prostate hypertrophy   . Herniated lumbar intervertebral disc   . Irregular heart beat     a. Pt does not know official diagnosis.  . Diastolic dysfunction     grade 2     No Known Allergies   Current Outpatient Prescriptions  Medication Sig Dispense Refill  . amLODipine-benazepril (LOTREL) 10-40 MG capsule Take 1 capsule by mouth daily. 90 capsule 3  . aspirin EC 81 MG tablet Take 81 mg by mouth daily.    . carvedilol (COREG) 12.5 MG tablet TAKE 1 TABLET BY MOUTH TWICE A DAY WITH A MEAL  1  . carvedilol (COREG) 25 MG tablet Take 1 tablet (25 mg total) by mouth 2 (two) times daily with a meal. 60 tablet 6  . finasteride (PROSCAR) 5 MG tablet Take 1 tablet (5 mg total) by mouth daily. 30 tablet 5  . furosemide (LASIX) 40 MG tablet Take 1 tablet (40 mg total) by mouth daily as needed for edema. And or shortness of breath 60 tablet 0  . glipiZIDE (GLUCOTROL) 5 MG tablet Take two tablets in the morning and one tablet in the evening (Patient taking differently: Take 5-10 mg by mouth 2 (two) times daily before a meal. Take two tablets in the morning and one tablet in the evening) 60 tablet 5  . glucose blood (ONE TOUCH ULTRA TEST) test strip TEST BLOOD SUGAR ONCE DAILY AS DIRECTED **DX E11.9 50 each 01  . potassium chloride SA (K-DUR,KLOR-CON) 20 MEQ tablet Take 1 tablet (20 mEq  total) by mouth daily. 30 tablet 1  . sildenafil (VIAGRA) 100 MG tablet Take 100 mg by mouth as needed for erectile dysfunction.    . triamterene-hydrochlorothiazide (MAXZIDE-25) 37.5-25 MG per tablet Take 1 tablet by mouth daily. 30 tablet 3   No current facility-administered medications for this visit.     Past Surgical History  Procedure Laterality Date  . Back surgery  1994 Dr. Roxan Hockeyobinson  . Back surgery  1994 Dr. Roxan Hockeyobinson  . Colonoscopy      one polyp  . Lumbar laminectomy/decompression microdiscectomy Left 06/22/2014    Procedure: LUMBAR LAMINECTOMY/DECOMPRESSION MICRODISCECTOMY 1 LEVEL;  Surgeon: Carmela HurtKyle L Cabbell, MD;  Location: [facility];  Service: Neurosurgery;  Laterality: Left;  Redo Left Lumbar Three-Four Microdiskectomy     No Known Allergies    Family History  Problem Relation Age of Onset  . Hypertension Mother   . Diabetes Mother   . Colon cancer Neg Hx   . Transient ischemic attack Mother     Died of ministrokes at 7475  . Depression Father     Died of "grieving"  . COPD Brother     Died of COPD  . Throat cancer Sister     Died of throat [place]  Social History Jeremy Rivera reports that he has never smoked. He has never used smokeless tobacco. Jeremy Rivera reports that he does not drink alcohol.   Review of Systems CONSTITUTIONAL: No weight loss, fever, chills, weakness HEENT: Eyes: No visual loss, blurred vision, double vision or yellow sclerae.No hearing loss, sneezing, congestion, runny nose or sore throat.  SKIN: No rash or itching.  CARDIOVASCULAR: per HPI RESPIRATORY: No shortness of breath, cough or sputum.  GASTROINTESTINAL: No anorexia, nausea, vomiting or diarrhea. No abdominal pain or blood.  GENITOURINARY: No burning on urination, no polyuria NEUROLOGICAL: No headache, dizziness, syncope, paralysis, ataxia, numbness or tingling in the extremities. No change in bowel or bladder control.  MUSCULOSKELETAL: No muscle, back pain, joint pain or  stiffness.  LYMPHATICS: No enlarged nodes. No history of splenectomy.  PSYCHIATRIC: No history of depression or anxiety.  ENDOCRINOLOGIC: No reports of sweating, cold or heat intolerance. No polyuria or polydipsia.  Jeremy Rivera   Physical Examination Filed Vitals:   11/09/15 1022  BP: 156/90  Pulse: 71   Filed Vitals:   11/09/15 1022  Height:  (1.803 m)  Weight: 263 lb (119.296 kg)    Gen: resting comfortably, no acute distress HEENT: no scleral icterus, pupils equal round and reactive, no palptable cervical adenopathy,  CV: RRR, no mrg,no jvd Resp: Clear to auscultation bilaterally GI: abdomen is soft, non-tender, non-distended, normal bowel sounds, no hepatosplenomegaly MSK: extremities are warm, no edema.  Skin: warm, no rash Neuro:  no focal deficits Psych: appropriate affect   Diagnostic Studies 06/2015 echo Study Conclusions  - Left ventricle: The cavity size was normal. Wall thickness was increased in a pattern of mild LVH. Systolic function was mildly to moderately reduced. The estimated ejection fraction was = 40%. No regional wall motion abnormalities. Features are consistent with a pseudonormal left ventricular filling pattern, with concomitant abnormal relaxation and increased filling pressure (grade 2 diastolic dysfunction). - Aortic valve: Mildly calcified annulus. Trileaflet; mildly thickened leaflets. Valve area (VTI): 2.41 cm^2. Valve area (Vmax): 2.49 cm^2. - Mitral valve: There was mild regurgitation. - Left atrium: The atrium was moderately dilated. - Pulmonary veins: There is blunting of systolic pulmonary vein flow consistent with elevated LA pressures. - Atrial septum: No defect or patent foramen ovale was identified. - Technically difficult study. Echo contrast was used to enhance visualization.    Assessment and Plan  1. Chronic systolic HF - LVEF 40%, NYHA II. 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 If no  improvement in function will need ischemic evaluation - appears euvolemic - will start aldactone  daily, check BMET in 2 weeks - in crease coreg to  bid.   2. HTN - since starting aldactone will stop maxide. Start chlorthalidone  daily  3. OSA screen - signs and symptoms of possible OSA, will refer for evaluation  F/u 6 weeks.    F/u 1 month      Antoine Poche, M.D.

## 2015-11-13 ENCOUNTER — Ambulatory Visit (HOSPITAL_COMMUNITY)
Admission: RE | Admit: 2015-11-13 | Discharge: 2015-11-13 | Disposition: A | Discharge status: Home / Self Care | Payer: Medicare Other | Source: Ambulatory Visit | Attending: Urology | Admitting: Urology

## 2015-11-13 DIAGNOSIS — R972 Elevated prostate specific antigen [PSA]: Secondary | ICD-10-CM | POA: Insufficient documentation

## 2015-11-13 MED ORDER — GADOBENATE DIMEGLUMINE 529 MG/ML IV SOLN
20.0000 mL | Freq: Once | INTRAVENOUS | Status: AC | PRN
  Administered 2015-11-13: 20 mL via INTRAVENOUS

## 2015-11-21 ENCOUNTER — Other Ambulatory Visit: Payer: Self-pay | Admitting: Family Medicine

## 2015-12-04 ENCOUNTER — Ambulatory Visit: Payer: Medicare Other | Admitting: Family Medicine

## 2015-12-15 ENCOUNTER — Telehealth: Payer: Self-pay | Admitting: Family Medicine

## 2015-12-15 ENCOUNTER — Other Ambulatory Visit: Payer: Self-pay | Admitting: Cardiology

## 2015-12-15 DIAGNOSIS — N400 Enlarged prostate without lower urinary tract symptoms: Secondary | ICD-10-CM

## 2015-12-15 DIAGNOSIS — E785 Hyperlipidemia, unspecified: Secondary | ICD-10-CM

## 2015-12-15 DIAGNOSIS — Z79899 Other long term (current) drug therapy: Secondary | ICD-10-CM | POA: Diagnosis not present

## 2015-12-15 DIAGNOSIS — E118 Type 2 diabetes mellitus with unspecified complications: Secondary | ICD-10-CM

## 2015-12-15 NOTE — Telephone Encounter (Signed)
Lip liv A1c psa

## 2015-12-15 NOTE — Telephone Encounter (Signed)
[facility]

## 2015-12-15 NOTE — Telephone Encounter (Signed)
Pt needs bw orders so he can do them tomorrow, went to the lab this morning  An no labs in, assuming was not called an requested. He has a PE on Monday   Last labs BMP 10/26/15  Orders in now from another doc for BMP, Magnesium (these were drawn this morning)

## 2015-12-16 LAB — BASIC METABOLIC PANEL
BUN/Creatinine Ratio: 13 (ref 10–22)
BUN: 17 mg/dL (ref 8–27)
CHLORIDE: 93 mmol/L — AB (ref 96–106)
CO2: 28 mmol/L (ref 18–29)
Calcium: 9.8 mg/dL (ref 8.6–10.2)
Creatinine, Ser: 1.3 mg/dL — ABNORMAL HIGH (ref 0.76–1.27)
GFR calc Af Amer: 66 mL/min/{1.73_m2} (ref >59)
GFR calc non Af Amer: 57 mL/min/{1.73_m2} — ABNORMAL LOW (ref >59)
GLUCOSE: 143 mg/dL — AB (ref 65–99)
POTASSIUM: 4.6 mmol/L (ref 3.5–5.2)
Sodium: 134 mmol/L (ref 134–144)

## 2015-12-16 LAB — MAGNESIUM: Magnesium: 2.1 mg/dL (ref 1.6–2.3)

## 2015-12-18 ENCOUNTER — Telehealth: Payer: Self-pay

## 2015-12-18 ENCOUNTER — Encounter: Payer: Self-pay | Admitting: Family Medicine

## 2015-12-18 ENCOUNTER — Encounter: Payer: Self-pay | Admitting: Cardiology

## 2015-12-18 ENCOUNTER — Ambulatory Visit (INDEPENDENT_AMBULATORY_CARE_PROVIDER_SITE_OTHER): Payer: Medicare Other | Admitting: Family Medicine

## 2015-12-18 ENCOUNTER — Ambulatory Visit (INDEPENDENT_AMBULATORY_CARE_PROVIDER_SITE_OTHER): Payer: Medicare Other | Admitting: Cardiology

## 2015-12-18 VITALS — BP 128/82 | HR 77 | Ht 71.0 in | Wt 260.0 lb

## 2015-12-18 VITALS — BP 134/88 | Ht 70.25 in | Wt 262.0 lb

## 2015-12-18 DIAGNOSIS — I5022 Chronic systolic (congestive) heart failure: Secondary | ICD-10-CM

## 2015-12-18 DIAGNOSIS — E119 Type 2 diabetes mellitus without complications: Secondary | ICD-10-CM | POA: Diagnosis not present

## 2015-12-18 DIAGNOSIS — E118 Type 2 diabetes mellitus with unspecified complications: Secondary | ICD-10-CM | POA: Diagnosis not present

## 2015-12-18 DIAGNOSIS — Z23 Encounter for immunization: Secondary | ICD-10-CM | POA: Diagnosis not present

## 2015-12-18 DIAGNOSIS — Z0000 Encounter for general adult medical examination without abnormal findings: Secondary | ICD-10-CM

## 2015-12-18 DIAGNOSIS — N400 Enlarged prostate without lower urinary tract symptoms: Secondary | ICD-10-CM | POA: Diagnosis not present

## 2015-12-18 DIAGNOSIS — Z79899 Other long term (current) drug therapy: Secondary | ICD-10-CM | POA: Diagnosis not present

## 2015-12-18 DIAGNOSIS — E785 Hyperlipidemia, unspecified: Secondary | ICD-10-CM | POA: Diagnosis not present

## 2015-12-18 NOTE — Telephone Encounter (Signed)
-----   Message from Jeremy PocheJonathan F Branch, MD sent at 12/18/2015  2:14 PM EST ----- Labs look good  Dominga FerryJ Branch MD

## 2015-12-18 NOTE — Progress Notes (Signed)
Patient ID: Beecher McardleRonnie E Bebo, male   DOB: [birth year], 64 y.o.   MRN: [id number]     Clinical Summary Mr. Tomas is a 64 y.o.male seen today for follow up of the following medical problems.  This is a focused visit on his history of chronic systolic HF, for more detailed history please refer to prior notes.   1. Chronic systolic HF - echo 06/2015 LVEF 40%, grade II diastolic dysfunction.   - last visit we increased coreg to 25mg  bid, started aldactone 25mg  daily. Do not see repeat labs in system. He denies any new side effects. No SOB, DOE, or LE edema  2. HTN - compliant with meds  3. OSA screen - last visit referred for sleep evaluation, test is coming up in a few weeks   SH: retired Corporate treasurerbrewery worker in 2008.  Past Medical History  Diagnosis Date  . Hypertension   . Diabetes mellitus without complication (HCC)   . ED (erectile dysfunction)   . Prostate hypertrophy   . Herniated lumbar intervertebral disc   . Irregular heart beat     a. Pt does not know official diagnosis.  . Diastolic dysfunction     grade 2     No Known Allergies   Current Outpatient Prescriptions  Medication Sig Dispense Refill  . amLODipine-benazepril (LOTREL) 10-40 MG capsule Take 1 capsule by mouth daily. 90 capsule 3  . aspirin EC 81 MG tablet Take 81 mg by mouth daily.    . carvedilol (COREG) 25 MG tablet Take 25 mg by mouth 2 (two) times daily with a meal.    . chlorthalidone (HYGROTON) 25 MG tablet Take 1 tablet (25 mg total) by mouth daily. 90 tablet 3  . finasteride (PROSCAR) 5 MG tablet Take 1 tablet (5 mg total) by mouth daily. 30 tablet 5  . furosemide (LASIX) 40 MG tablet Take 1 tablet (40 mg total) by mouth daily as needed for edema. And or shortness of breath 60 tablet 0  . glipiZIDE (GLUCOTROL) 5 MG tablet Take two tablets in the morning and one tablet in the evening (Patient taking differently: Take 5-10 mg by mouth 2 (two) times daily before a meal. Take two tablets in the morning and one  tablet in the evening) 60 tablet 5  . glucose blood (ONE TOUCH ULTRA TEST) test strip TEST BLOOD SUGAR ONCE DAILY AS DIRECTED **DX E11.9 50 each 01  . potassium chloride SA (K-DUR,KLOR-CON) 20 MEQ tablet Take 1 tablet (20 mEq total) by mouth daily. (Patient not taking: Reported on 11/09/2015) 30 tablet 1  . sildenafil (VIAGRA) 100 MG tablet Take 100 mg by mouth as needed for erectile dysfunction.    Marland Kitchen. spironolactone (ALDACTONE) 25 MG tablet Take 1 tablet (25 mg total) by mouth daily. 90 tablet 3  . triamterene-hydrochlorothiazide (MAXZIDE-25) 37.5-25 MG tablet TAKE 1 TABLET BY MOUTH DAILY. 30 tablet 3   No current facility-administered medications for this visit.     Past Surgical History  Procedure Laterality Date  . Back surgery  1994 Dr. Roxan Hockeyobinson  . Back surgery  1994 Dr. Roxan Hockeyobinson  . Colonoscopy      one polyp  . Lumbar laminectomy/decompression microdiscectomy Left 06/22/2014    Procedure: LUMBAR LAMINECTOMY/DECOMPRESSION MICRODISCECTOMY 1 LEVEL;  Surgeon: Carmela HurtKyle L Cabbell, MD;  Location: [facility];  Service: Neurosurgery;  Laterality: Left;  Redo Left Lumbar Three-Four Microdiskectomy     No Known Allergies    Family History  Problem Relation Age of Onset  . Hypertension  Mother   . Diabetes Mother   . Colon cancer Neg Hx   . Transient ischemic attack Mother     Died of ministrokes at 75  . Depression Father     Died of "grieving"  . COPD Brother     Died of COPD  . Throat cancer Sister     Died of throat CA     Social History Mr. Palacios reports that he has never smoked. He has never used smokeless tobacco. Mr. Rothermel reports that he does not drink alcohol.   Review of Systems CONSTITUTIONAL: No weight loss, fever, chills, weakness or fatigue.  HEENT: Eyes: No visual loss, blurred vision, double vision or yellow sclerae.No hearing loss, sneezing, congestion, runny nose or sore throat.  SKIN: No rash or itching.  CARDIOVASCULAR: per HPI RESPIRATORY: No shortness  of breath, cough or sputum.  GASTROINTESTINAL: No anorexia, nausea, vomiting or diarrhea. No abdominal pain or blood.  GENITOURINARY: No burning on urination, no polyuria NEUROLOGICAL: No headache, dizziness, syncope, paralysis, ataxia, numbness or tingling in the extremities. No change in bowel or bladder control.  MUSCULOSKELETAL: No muscle, back pain, joint pain or stiffness.  LYMPHATICS: No enlarged nodes. No history of splenectomy.  PSYCHIATRIC: No history of depression or anxiety.  ENDOCRINOLOGIC: No reports of sweating, cold or heat intolerance. No polyuria or polydipsia.  Marland Kitchen   Physical Examination Filed Vitals:   12/18/15 1312  BP: 128/82  Pulse: 77   Filed Vitals:   12/18/15 1312  Height:  (1.803 m)  Weight: 260 lb (117.935 kg)    Gen: resting comfortably, no acute distress HEENT: no scleral icterus, pupils equal round and reactive, no palptable cervical adenopathy,  CV: RRR, no m/r/g, no jvd Resp: Clear to auscultation bilaterally GI: abdomen is soft, non-tender, non-distended, normal bowel sounds, no hepatosplenomegaly MSK: extremities are warm, no edema.  Skin: warm, no rash Neuro:  no focal deficits Psych: appropriate affect   Diagnostic Studies 06/2015 echo Study Conclusions  - Left ventricle: The cavity size was normal. Wall thickness was increased in a pattern of mild LVH. Systolic function was mildly to moderately reduced. The estimated ejection fraction was = 40%. No regional wall motion abnormalities. Features are consistent with a pseudonormal left ventricular filling pattern, with concomitant abnormal relaxation and increased filling pressure (grade 2 diastolic dysfunction). - Aortic valve: Mildly calcified annulus. Trileaflet; mildly thickened leaflets. Valve area (VTI): 2.41 cm^2. Valve area (Vmax): 2.49 cm^2. - Mitral valve: There was mild regurgitation. - Left atrium: The atrium was moderately dilated. - Pulmonary veins:  There is blunting of systolic pulmonary vein flow consistent with elevated LA pressures. - Atrial septum: No defect or patent foramen ovale was identified. - Technically difficult study. Echo contrast was used to enhance visualization.    Assessment and Plan   1. Chronic systolic HF - LVEF 40%, NYHA II. 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 If no improvement in function will need ischemic evaluation - appears euvolemic. He is on goal doses of medical therapy - repeat echo to reevluate LVEF  2. HTN - he did not stop his maxide since last visit, we will ask him to d/c again today. He will continue his chlorthalidone and aldactone, with prn lasix for significant edema.   3. OSA screen -upcoming sleep study  F/u 2 months     Antoine Poche, M.D.

## 2015-12-18 NOTE — Patient Instructions (Signed)
Medication Instructions:  STOP MAXZIDE   Labwork: NONE  Testing/Procedures: Your physician has requested that you have an echocardiogram. Echocardiography is a painless test that uses sound waves to create images of your heart. It provides your doctor with information about the size and shape of your heart and how well your heart's chambers and valves are working. This procedure takes approximately one hour. There are no restrictions for this procedure.    Follow-Up: Your physician recommends that you schedule a follow-up appointment in: 2 MONTHS WITH DR. BRANCH    Any Other Special Instructions Will Be Listed Below (If Applicable).     If you need a refill on your cardiac medications before your next appointment, please call your pharmacy. '

## 2015-12-18 NOTE — Progress Notes (Signed)
   Subjective:    Patient ID: Jeremy Rivera, male    DOB: May 14, 1951, 64 y.o.   MRN: [id number]  HPI AWV- Annual Wellness Visit  The patient was seen for their annual wellness visit. The patient's past medical history, surgical history, and family history were reviewed. Pertinent vaccines were reviewed ( tetanus, pneumonia, shingles, flu) The patient's medication list was reviewed and updated.  The height and weight were entered. The patient's current BMI is: 37.33  Cognitive screening was completed. Outcome of Mini - Cog: pass  Falls within the past 6 months: none  Current tobacco usage: none (All patients who use tobacco were given written and verbal information on quitting)  Recent listing of emergency department/hospitalizations over the past year were reviewed.  current specialist the patient sees on a regular basis: Dr. Wyline MoodBranch- cardiology. Dr. Marcello Fennelannebaum [facility].    Medicare annual wellness visit patient questionnaire was reviewed.  A written screening schedule for the patient for the next 5-10 years was given. Appropriate discussion of followup regarding next visit was discussed.  Colonoscopy 01/25/15.   Colon was neg told ten yrs,  uro did mri, and stated the results wetre good, with fam hx of prost cancer, uro tking aggressive approach   Fasting glu generally in the 120s , genereally numbers on the low side. Medications reviewed today. Compliance good. A1c still pending.  Review of Systems  Constitutional: Negative for fever, activity change and appetite change.  HENT: Negative for congestion and rhinorrhea.   Eyes: Negative for discharge.  Respiratory: Negative for cough and wheezing.   Cardiovascular: Negative for chest pain.  Gastrointestinal: Negative for vomiting, abdominal pain and blood in stool.  Genitourinary: Negative for frequency and difficulty urinating.  Musculoskeletal: Negative for neck pain.  Skin: Negative for rash.  Allergic/Immunologic:  Negative for environmental allergies and food allergies.  Neurological: Negative for weakness and headaches.  Psychiatric/Behavioral: Negative for agitation.  All other systems reviewed and are negative.      Objective:   Physical Exam  Constitutional: He appears well-developed and well-nourished.  Morbid obesity present  HENT:  Head: Normocephalic and atraumatic.  Right Ear: External ear normal.  Left Ear: External ear normal.  Nose: Nose normal.  Mouth/Throat: Oropharynx is clear and moist.  Eyes: EOM are normal. Pupils are equal, round, and reactive to light.  Neck: Normal range of motion. Neck supple. No thyromegaly present.  Cardiovascular: Normal rate, regular rhythm and normal heart sounds.   No murmur heard. Pulmonary/Chest: Effort normal and breath sounds normal. No respiratory distress. He has no wheezes.  Abdominal: Soft. Bowel sounds are normal. He exhibits no distension and no mass. There is no tenderness.  Genitourinary: Penis normal.  Musculoskeletal: Normal range of motion. He exhibits no edema.  Lymphadenopathy:    He has no cervical adenopathy.  Neurological: He is alert. He exhibits normal muscle tone.  Skin: Skin is warm and dry. No erythema.  Psychiatric: He has a normal mood and affect. His behavior is normal. Judgment normal.  Vitals reviewed.         Assessment & Plan:  Imp well adul t exam 2 type two diab, mewds reviewed, compliance disc, s e's dis , no sig low sugar spells, cont same dose3 htn good control Plan wait on b w, pneum shot, diet exer disc, encouraged ophthalm visit

## 2015-12-18 NOTE — Telephone Encounter (Signed)
Left detailed message on personal voicemail

## 2015-12-19 LAB — HEPATIC FUNCTION PANEL
ALT: 12 IU/L (ref 0–44)
AST: 11 IU/L (ref 0–40)
Albumin: 4.4 g/dL (ref 3.6–4.8)
Alkaline Phosphatase: 66 IU/L (ref 39–117)
BILIRUBIN TOTAL: 0.4 mg/dL (ref 0.0–1.2)
Bilirubin, Direct: 0.11 mg/dL (ref 0.00–0.40)
Total Protein: 6.8 g/dL (ref 6.0–8.5)

## 2015-12-19 LAB — LIPID PANEL
CHOL/HDL RATIO: 5.4 ratio — AB (ref 0.0–5.0)
Cholesterol, Total: 184 mg/dL (ref 100–199)
HDL: 34 mg/dL — AB (ref >39)
LDL CALC: 115 mg/dL — AB (ref 0–99)
Triglycerides: 175 mg/dL — ABNORMAL HIGH (ref 0–149)
VLDL Cholesterol Cal: 35 mg/dL (ref 5–40)

## 2015-12-19 LAB — HEMOGLOBIN A1C
Est. average glucose Bld gHb Est-mCnc: 163 mg/dL
HEMOGLOBIN A1C: 7.3 % — AB (ref 4.8–5.6)

## 2015-12-19 LAB — PSA: PROSTATE SPECIFIC AG, SERUM: 8.7 ng/mL — AB (ref 0.0–4.0)

## 2016-01-22 ENCOUNTER — Ambulatory Visit (HOSPITAL_COMMUNITY)
Admission: RE | Admit: 2016-01-22 | Discharge: 2016-01-22 | Disposition: A | Discharge status: Home / Self Care | Payer: Medicare Other | Source: Ambulatory Visit | Attending: Cardiology | Admitting: Cardiology

## 2016-01-22 DIAGNOSIS — I081 Rheumatic disorders of both mitral and tricuspid valves: Secondary | ICD-10-CM | POA: Insufficient documentation

## 2016-01-22 DIAGNOSIS — I5020 Unspecified systolic (congestive) heart failure: Secondary | ICD-10-CM | POA: Insufficient documentation

## 2016-01-22 DIAGNOSIS — I5022 Chronic systolic (congestive) heart failure: Secondary | ICD-10-CM

## 2016-02-05 NOTE — Telephone Encounter (Signed)
See previous encounter

## 2016-02-23 ENCOUNTER — Other Ambulatory Visit: Payer: Self-pay | Admitting: Family Medicine

## 2016-02-26 ENCOUNTER — Ambulatory Visit (INDEPENDENT_AMBULATORY_CARE_PROVIDER_SITE_OTHER): Payer: Medicare Other | Admitting: Cardiology

## 2016-02-26 ENCOUNTER — Encounter: Payer: Self-pay | Admitting: Cardiology

## 2016-02-26 VITALS — BP 132/78 | HR 81 | Ht 71.0 in | Wt 265.0 lb

## 2016-02-26 DIAGNOSIS — I10 Essential (primary) hypertension: Secondary | ICD-10-CM | POA: Diagnosis not present

## 2016-02-26 DIAGNOSIS — I5022 Chronic systolic (congestive) heart failure: Secondary | ICD-10-CM

## 2016-02-26 NOTE — Progress Notes (Signed)
Patient ID: Jeremy Rivera, male   DOB: 07-12-1951, 65 y.o.   MRN: [id number] Patient ID: Jeremy Rivera, male   DOB: [birth year], 65 y.o.   MRN: [id number]     Clinical Summary [name] is a 65 y.o.male seen today for follow up of the following medical problems.    1. Chronic systolic HF - echo 06/2015 LVEF 40%, grade II diastolic dysfunction.  - denies any SOB or DOE. No LE edema - compliant with meds. Limiting sodium intake.   2. HTN - compliant with meds  3. OSA screen - last visit referred for sleep evaluation, test is coming up in a few weeks per his report.    SH: retired Corporate treasurer in 2008.  Past Medical History  Diagnosis Date  . Hypertension   . Diabetes mellitus without complication (HCC)   . ED (erectile dysfunction)   . Prostate hypertrophy   . Herniated lumbar intervertebral disc   . Irregular heart beat     a. Pt does not know official diagnosis.  . Diastolic dysfunction     grade 2     No Known Allergies   Current Outpatient Prescriptions  Medication Sig Dispense Refill  . amLODipine-benazepril (LOTREL) 10-40 MG capsule Take 1 capsule by mouth daily. 90 capsule 3  . aspirin EC 81 MG tablet Take 81 mg by mouth daily.    . carvedilol (COREG) 25 MG tablet Take 25 mg by mouth 2 (two) times daily with a meal.    . chlorthalidone (HYGROTON) 25 MG tablet Take 1 tablet (25 mg total) by mouth daily. 90 tablet 3  . finasteride (PROSCAR) 5 MG tablet Take 1 tablet (5 mg total) by mouth daily. 30 tablet 5  . furosemide (LASIX) 40 MG tablet Take 1 tablet (40 mg total) by mouth daily as needed for edema. And or shortness of breath 60 tablet 0  . glipiZIDE (GLUCOTROL) 5 MG tablet TAKE TWO TABLETS IN THE MORNING AND ONE TABLET IN THE EVENING 60 tablet 5  . glucose blood (ONE TOUCH ULTRA TEST) test strip TEST BLOOD SUGAR ONCE DAILY AS DIRECTED **DX E11.9 50 each 01  . potassium chloride SA (K-DUR,KLOR-CON) 20 MEQ tablet Take 1 tablet (20 mEq total) by mouth daily. 30  tablet 1  . sildenafil (VIAGRA) 100 MG tablet Take 100 mg by mouth as needed for erectile dysfunction.    Marland Kitchen spironolactone (ALDACTONE) 25 MG tablet Take 1 tablet (25 mg total) by mouth daily. 90 tablet 3   No current facility-administered medications for this visit.     Past Surgical History  Procedure Laterality Date  . Back surgery  1994 Dr. Roxan Hockey  . Back surgery  1994 Dr. Roxan Hockey  . Colonoscopy      one polyp  . Lumbar laminectomy/decompression microdiscectomy Left 06/22/2014    Procedure: LUMBAR LAMINECTOMY/DECOMPRESSION MICRODISCECTOMY 1 LEVEL;  Surgeon: Carmela Hurt, MD;  Location: [facility];  Service: Neurosurgery;  Laterality: Left;  Redo Left Lumbar Three-Four Microdiskectomy     No Known Allergies    Family History  Problem Relation Age of Onset  . Hypertension Mother   . Diabetes Mother   . Colon cancer Neg Hx   . Transient ischemic attack Mother     Died of ministrokes at 46  . Depression Father     Died of "grieving"  . COPD Brother     Died of COPD  . Throat cancer Sister     Died of throat [place]  Social History [name] reports that he has never smoked. He has never used smokeless tobacco. [name] reports that he does not drink alcohol.   Review of Systems CONSTITUTIONAL: No weight loss, fever, chills, weakness or fatigue.  HEENT: Eyes: No visual loss, blurred vision, double vision or yellow sclerae.No hearing loss, sneezing, congestion, runny nose or sore throat.  SKIN: No rash or itching.  CARDIOVASCULAR: per HPI RESPIRATORY: No shortness of breath, cough or sputum.  GASTROINTESTINAL: No anorexia, nausea, vomiting or diarrhea. No abdominal pain or blood.  GENITOURINARY: No burning on urination, no polyuria NEUROLOGICAL: No headache, dizziness, syncope, paralysis, ataxia, numbness or tingling in the extremities. No change in bowel or bladder control.  MUSCULOSKELETAL: No muscle, back pain, joint pain or stiffness.  LYMPHATICS: No  enlarged nodes. No history of splenectomy.  PSYCHIATRIC: No history of depression or anxiety.  ENDOCRINOLOGIC: No reports of sweating, cold or heat intolerance. No polyuria or polydipsia.  Marland Kitchen   Physical Examination Filed Vitals:   02/26/16 1518  BP: 132/78  Pulse: 81   Filed Vitals:   02/26/16 1518  Height:  (1.803 m)  Weight: 265 lb (120.203 kg)    Gen: resting comfortably, no acute distress HEENT: no scleral icterus, pupils equal round and reactive, no palptable cervical adenopathy,  CV: RRR, no m/r/g, no jvd Resp: Clear to auscultation bilaterally GI: abdomen is soft, non-tender, non-distended, normal bowel sounds, no hepatosplenomegaly MSK: extremities are warm, no edema.  Skin: warm, no rash Neuro:  no focal deficits Psych: appropriate affect   Diagnostic Studies 06/2015 echo Study Conclusions  - Left ventricle: The cavity size was normal. Wall thickness was increased in a pattern of mild LVH. Systolic function was mildly to moderately reduced. The estimated ejection fraction was = 40%. No regional wall motion abnormalities. Features are consistent with a pseudonormal left ventricular filling pattern, with concomitant abnormal relaxation and increased filling pressure (grade 2 diastolic dysfunction). - Aortic valve: Mildly calcified annulus. Trileaflet; mildly thickened leaflets. Valve area (VTI): 2.41 cm^2. Valve area (Vmax): 2.49 cm^2. - Mitral valve: There was mild regurgitation. - Left atrium: The atrium was moderately dilated. - Pulmonary veins: There is blunting of systolic pulmonary vein flow consistent with elevated LA pressures. - Atrial septum: No defect or patent foramen ovale was identified. - Technically difficult study. Echo contrast was used to enhance visualization.  Jan 2017 echo Study Conclusions  - Left ventricle: The cavity size was normal. Wall thickness was increased in a pattern of mild LVH. Systolic function  was mildly to moderately reduced. The estimated ejection fraction was in the range of 40% to 45%. Diffuse hypokinesis. Doppler parameters are consistent with abnormal left ventricular relaxation (grade 1 diastolic dysfunction). Normal filling pressures. - Aortic valve: Mildly calcified annulus. Trileaflet; mildly calcified leaflets. - Mitral valve: There was trivial regurgitation. - Left atrium: The atrium was mildly dilated. - Right atrium: Central venous pressure (est): 3 mm Hg. - Tricuspid valve: There was trivial regurgitation. - Pulmonary arteries: Systolic pressure could not be accurately estimated. - Pericardium, extracardiac: There was no pericardial effusion.  Impressions:  - Mild LVH with LVEF approximately 40-45%, diffuse hypokinesis with regional variation. Grade 1 diastolic dysfunction with normal estimated filling pressures. Mild left atrial enlargement. Trivial mitral regurgitation. Mildly sclerotic aortic valve. Trivial tricuspid regurgitation, PASP not estimated.  Assessment and Plan   1. Chronic systolic HF - LVEF 40%, NYHA II. LVEF has not improved with medical therapy. We discussed again today an ischemic evaluation in the  setting of his persistent systolic dysfunction. He favors noninvasive testing, given his limited symptoms and mild LV sysotlic dysfunction I think this is a reasonable approach as opposed to cath. We will plan an exercise nuclear stress test 2 day protocol due to body habitus.   2. HTN - at goal, continue current meds   3. OSA screen -upcoming sleep study  F/u 6 months     Antoine Poche, M.D.

## 2016-02-26 NOTE — Patient Instructions (Signed)
Your physician wants you to follow-up in: 6 months You will receive a reminder letter in the mail two months in advance. If you don't receive a letter, please call our office to schedule the follow-up appointment.    Your physician has requested that you have en exercise stress myoview 2 DAY PROTOCOL. For further information please visit [email or web address] Please follow instruction sheet, as given.  HOLD COREG THE DAY OF TEST    Your physician recommends that you continue on your current medications as directed. Please refer to the Current Medication list given to you today.    If you need a refill on your cardiac medications before your next appointment, please call your pharmacy.     Thank you for choosing [facility] !

## 2016-02-28 ENCOUNTER — Telehealth: Payer: Self-pay | Admitting: Cardiology

## 2016-02-28 NOTE — Telephone Encounter (Signed)
Patient would like to speak with nurse regarding medications. / tg  °

## 2016-02-29 NOTE — Telephone Encounter (Signed)
Pt's insurance will not cover spironolactone as of the first of the year.He requests something else.   Will forward to Dr Wyline Mood

## 2016-03-01 NOTE — Telephone Encounter (Signed)
That's odd his insurance is no longer covering, aldactone is indicated given his history of heart failure. The only alternative is a medication called eplerenone which typically is more expensive. Is he sure its not just a deductible he has to meet for aldactone?    Dominga FerryJ Leighann Amadon MD

## 2016-03-01 NOTE — Telephone Encounter (Signed)
Eplernone is way expensive,we found this out before. I will call patient again and clarify

## 2016-03-05 NOTE — Telephone Encounter (Signed)
No answer-cc

## 2016-03-08 NOTE — Telephone Encounter (Signed)
I soke with wife as patient was in shower,they will ask insurance company for formulary for alternative drug for spironolactone to use and let us know

## 2016-03-12 ENCOUNTER — Ambulatory Visit (HOSPITAL_COMMUNITY)
Admission: RE | Admit: 2016-03-12 | Discharge: 2016-03-12 | Disposition: A | Discharge status: Home / Self Care | Payer: Medicare Other | Source: Ambulatory Visit | Attending: Cardiology | Admitting: Cardiology

## 2016-03-12 ENCOUNTER — Encounter (HOSPITAL_COMMUNITY)
Admission: RE | Admit: 2016-03-12 | Discharge: 2016-03-12 | Disposition: A | Discharge status: Home / Self Care | Payer: Medicare Other | Source: Ambulatory Visit | Attending: Cardiology | Admitting: Cardiology

## 2016-03-12 DIAGNOSIS — I5022 Chronic systolic (congestive) heart failure: Secondary | ICD-10-CM

## 2016-03-12 DIAGNOSIS — Z029 Encounter for administrative examinations, unspecified: Secondary | ICD-10-CM | POA: Insufficient documentation

## 2016-03-12 MED ORDER — REGADENOSON 0.4 MG/5ML IV SOLN
INTRAVENOUS | Status: AC
  Administered 2016-03-12: 0.4 mg via INTRAVENOUS
  Filled 2016-03-12: qty 5

## 2016-03-12 MED ORDER — SODIUM CHLORIDE 0.9% FLUSH
INTRAVENOUS | Status: AC
  Administered 2016-03-12: 10 mL via INTRAVENOUS
  Filled 2016-03-12: qty 10

## 2016-03-12 MED ORDER — TECHNETIUM TC 99M SESTAMIBI GENERIC - CARDIOLITE
30.0000 | Freq: Once | INTRAVENOUS | Status: AC | PRN
  Administered 2016-03-12: 32 via INTRAVENOUS

## 2016-03-13 ENCOUNTER — Encounter (HOSPITAL_COMMUNITY)
Admission: RE | Admit: 2016-03-13 | Discharge: 2016-03-13 | Disposition: A | Discharge status: Home / Self Care | Payer: Medicare Other | Source: Ambulatory Visit | Attending: Cardiology | Admitting: Cardiology

## 2016-03-13 DIAGNOSIS — I5022 Chronic systolic (congestive) heart failure: Secondary | ICD-10-CM | POA: Diagnosis not present

## 2016-03-13 LAB — NM MYOCAR MULTI W/SPECT W/WALL MOTION / EF
CHL CUP NUCLEAR SDS: 1
CHL CUP NUCLEAR SSS: 2
CSEPHR: 77 %
CSEPPHR: 120 {beats}/min
Estimated workload: 7 METS
Exercise duration (min): 5 min
Exercise duration (sec): 17 s
LHR: 0.32
LV dias vol: 176 mL (ref 62–150)
LV sys vol: 127 mL
MPHR: 154 {beats}/min
Rest HR: 77 {beats}/min
SRS: 1
TID: 0.78

## 2016-03-13 MED ORDER — TECHNETIUM TC 99M SESTAMIBI GENERIC - CARDIOLITE
25.0000 | Freq: Once | INTRAVENOUS | Status: AC | PRN
  Administered 2016-03-13: 26 via INTRAVENOUS

## 2016-03-18 ENCOUNTER — Other Ambulatory Visit: Payer: Self-pay | Admitting: Cardiology

## 2016-03-26 DIAGNOSIS — R972 Elevated prostate specific antigen [PSA]: Secondary | ICD-10-CM | POA: Diagnosis not present

## 2016-03-30 LAB — HM DIABETES EYE EXAM

## 2016-04-02 DIAGNOSIS — R972 Elevated prostate specific antigen [PSA]: Secondary | ICD-10-CM | POA: Diagnosis not present

## 2016-04-02 DIAGNOSIS — N401 Enlarged prostate with lower urinary tract symptoms: Secondary | ICD-10-CM | POA: Diagnosis not present

## 2016-04-02 DIAGNOSIS — R350 Frequency of micturition: Secondary | ICD-10-CM | POA: Diagnosis not present

## 2016-04-02 DIAGNOSIS — Z0000 Encounter for general adult medical examination without abnormal findings: Secondary | ICD-10-CM | POA: Diagnosis not present

## 2016-04-02 DIAGNOSIS — N138 Other obstructive and reflux uropathy: Secondary | ICD-10-CM | POA: Diagnosis not present

## 2016-06-17 ENCOUNTER — Encounter: Payer: Self-pay | Admitting: Family Medicine

## 2016-06-17 ENCOUNTER — Ambulatory Visit (INDEPENDENT_AMBULATORY_CARE_PROVIDER_SITE_OTHER): Payer: Medicare Other | Admitting: Family Medicine

## 2016-06-17 VITALS — BP 122/78 | Ht 70.25 in | Wt 265.1 lb

## 2016-06-17 DIAGNOSIS — I119 Hypertensive heart disease without heart failure: Secondary | ICD-10-CM

## 2016-06-17 DIAGNOSIS — N400 Enlarged prostate without lower urinary tract symptoms: Secondary | ICD-10-CM | POA: Diagnosis not present

## 2016-06-17 DIAGNOSIS — E119 Type 2 diabetes mellitus without complications: Secondary | ICD-10-CM

## 2016-06-17 DIAGNOSIS — I10 Essential (primary) hypertension: Secondary | ICD-10-CM

## 2016-06-17 DIAGNOSIS — I43 Cardiomyopathy in diseases classified elsewhere: Secondary | ICD-10-CM

## 2016-06-17 LAB — POCT GLYCOSYLATED HEMOGLOBIN (HGB A1C): Hemoglobin A1C: 7.9

## 2016-06-17 MED ORDER — GLIPIZIDE 5 MG PO TABS: ORAL_TABLET | ORAL | Status: DC

## 2016-06-17 MED ORDER — FINASTERIDE 5 MG PO TABS: 5.0000 mg | ORAL_TABLET | Freq: Every day | ORAL | Status: DC

## 2016-06-17 NOTE — Progress Notes (Signed)
   Subjective:    Patient ID: Jeremy Rivera, male    DOB: 07-19-1951, 65 y.o.   MRN: [id number] Patient arrives office with numerous concerns Diabetes He presents for his follow-up diabetic visit. He has type 2 diabetes mellitus. No MedicAlert identification noted. He has not had a previous visit with a dietitian. He does not see a podiatrist.Eye exam is not current.  States fasting sugars have been rising lately. Seems occurred since starting the diuretic. Wonders if he really needs to be on diuretics  Compliant blood pressure medication. No obvious side effects. Watching compliance closely. Does not miss a dose. Overall blood pressures good when checked elsewhere  Continues to use Proscar. Good control of urinating. PSA at this time actually followed by urologist with history of focal hyperplasia and family history of prostate cancer next  Had full cardiac workup. This revealed some hypokinesia of the left ventricle. Part of the rationale for the medicines patient is on now via the cardiologist  Patient states no other concerns this visit. Results for orders placed or performed in visit on 06/17/16  POCT HgB A1C  Result Value Ref Range   Hemoglobin A1C 7.9    Glu's running high recently  No sig low sugar spells ,   No eye doc visit yet this yr    Review of Systems No headache, no major weight loss or weight gain, no chest pain no back pain abdominal pain no change in bowel habits complete ROS otherwise negative     Objective:   Physical Exam alert vitals stable. Obesity persists blood pressure excellent on repeat HEENT normal. Lungs clear heart rare rhythm ankles without edema C diabetic foot exam      Assessment & Plan:  Impression and plan 1 type 2 diabetes control suboptimal in discussed need to change medicines will increase Glucotrol to 2 twice a day #2 hypertension good control discussed maintain same meds rationale discussed #3 prostate hypertrophy with nocturia to  maintain Proscar rationale discussed #4 blood pressure medicine concerns. Reviewed cardiologist workup I think he needs to be on all the Mensing is on discussed 25 minutes spent most in discussion follow-up as scheduled

## 2016-06-27 ENCOUNTER — Other Ambulatory Visit: Payer: Self-pay | Admitting: Family Medicine

## 2016-09-22 ENCOUNTER — Other Ambulatory Visit: Payer: Self-pay | Admitting: Cardiology

## 2016-09-25 DIAGNOSIS — Z23 Encounter for immunization: Secondary | ICD-10-CM | POA: Diagnosis not present

## 2016-10-02 ENCOUNTER — Ambulatory Visit (INDEPENDENT_AMBULATORY_CARE_PROVIDER_SITE_OTHER): Payer: Medicare Other | Admitting: Cardiology

## 2016-10-02 ENCOUNTER — Encounter: Payer: Self-pay | Admitting: Cardiology

## 2016-10-02 VITALS — BP 122/78 | HR 83 | Ht 71.0 in | Wt 267.0 lb

## 2016-10-02 DIAGNOSIS — G4730 Sleep apnea, unspecified: Secondary | ICD-10-CM | POA: Diagnosis not present

## 2016-10-02 DIAGNOSIS — I10 Essential (primary) hypertension: Secondary | ICD-10-CM

## 2016-10-02 DIAGNOSIS — I5022 Chronic systolic (congestive) heart failure: Secondary | ICD-10-CM | POA: Diagnosis not present

## 2016-10-02 MED ORDER — SPIRONOLACTONE 25 MG PO TABS: 25.0000 mg | ORAL_TABLET | Freq: Every day | ORAL | 3 refills | Status: DC

## 2016-10-02 MED ORDER — AMLODIPINE BESY-BENAZEPRIL HCL 10-40 MG PO CAPS: 1.0000 | ORAL_CAPSULE | Freq: Every day | ORAL | 3 refills | Status: DC

## 2016-10-02 MED ORDER — CARVEDILOL 25 MG PO TABS: 25.0000 mg | ORAL_TABLET | Freq: Two times a day (BID) | ORAL | 3 refills | Status: DC

## 2016-10-02 MED ORDER — CHLORTHALIDONE 25 MG PO TABS: 25.0000 mg | ORAL_TABLET | Freq: Every day | ORAL | 3 refills | Status: DC

## 2016-10-02 NOTE — Progress Notes (Signed)
Clinical Summary Jeremy Rivera is a 65 y.o.male seen today for follow up of the following medical problems.  1. Chronic systolic HF - echo 06/2015 LVEF 40%, grade II diastolic dysfunction.  - echo Jan 2017 LVEF 40-45%, grade I diastolic dysfunction - 02/2016 nuclear stress moderate inferior defect probable attenuation, cannot rule out scar. No ischemia.    - denies any SOB or DOE. No recent LE edema - compliant with med. Limiting sodium intake. Takes occasional aleve.    2. HTN - compliant with meds - checks at home occasionally, typicalyl around 120s-130s/70-80s  3. OSA screen +snoring, +apneic somnolence, no daytime somnolence.  - he has been resistant about sleep testing    Past Medical History:  Diagnosis Date  . Diabetes mellitus without complication (HCC)   . Diastolic dysfunction    grade 2  . ED (erectile dysfunction)   . Herniated lumbar intervertebral disc   . Hypertension   . Irregular heart beat    a. Pt does not know official diagnosis.  . Prostate hypertrophy      No Known Allergies   Current Outpatient Prescriptions  Medication Sig Dispense Refill  . amLODipine-benazepril (LOTREL) 10-40 MG capsule TAKE 1 CAPSULE BY MOUTH DAILY. 90 capsule 3  . aspirin EC 81 MG tablet Take 81 mg by mouth daily.    . carvedilol (COREG) 25 MG tablet Take 25 mg by mouth 2 (two) times daily with a meal.    . carvedilol (COREG) 25 MG tablet TAKE 1 TABLET (25 MG TOTAL) BY MOUTH 2 (TWO) TIMES DAILY WITH A MEAL. 60 tablet 11  . chlorthalidone (HYGROTON) 25 MG tablet Take 1 tablet (25 mg total) by mouth daily. 90 tablet 3  . finasteride (PROSCAR) 5 MG tablet Take 1 tablet (5 mg total) by mouth daily. 90 tablet 1  . glipiZIDE (GLUCOTROL) 5 MG tablet Take two tablets in the morning and two tablets in the evening. 360 tablet 1  . glipiZIDE (GLUCOTROL) 5 MG tablet TAKE TWO TWICE DAILY 120 tablet 5  . glucose blood (ONE TOUCH ULTRA TEST) test strip TEST BLOOD SUGAR ONCE DAILY  AS DIRECTED **DX E11.9 50 each 01  . spironolactone (ALDACTONE) 25 MG tablet Take 1 tablet (25 mg total) by mouth daily. 90 tablet 3   No current facility-administered medications for this visit.      Past Surgical History:  Procedure Laterality Date  . BACK SURGERY  1994 Dr. Roxan Hockey  . BACK SURGERY  1994 Dr. Roxan Hockey  . COLONOSCOPY     one polyp  . LUMBAR LAMINECTOMY/DECOMPRESSION MICRODISCECTOMY Left 06/22/2014   Procedure: LUMBAR LAMINECTOMY/DECOMPRESSION MICRODISCECTOMY 1 LEVEL;  Surgeon: Carmela Hurt, MD;  Location: [facility];  Service: Neurosurgery;  Laterality: Left;  Redo Left Lumbar Three-Four Microdiskectomy     No Known Allergies    Family History  Problem Relation Age of Onset  . Hypertension Mother   . Diabetes Mother   . Colon cancer Neg Hx   . Transient ischemic attack Mother     Died of ministrokes at 30  . Depression Father     Died of "grieving"  . COPD Brother     Died of COPD  . Throat cancer Sister     Died of throat CA     Social History Jeremy Rivera reports that he has never smoked. He has never used smokeless tobacco. Jeremy Rivera reports that he does not drink alcohol.   Review of Systems CONSTITUTIONAL: No weight loss,  fever, chills, weakness or fatigue.  HEENT: Eyes: No visual loss, blurred vision, double vision or yellow sclerae.No hearing loss, sneezing, congestion, runny nose or sore throat.  SKIN: No rash or itching.  CARDIOVASCULAR: per HPI RESPIRATORY: No shortness of breath, cough or sputum.  GASTROINTESTINAL: No anorexia, nausea, vomiting or diarrhea. No abdominal pain or blood.  GENITOURINARY: No burning on urination, no polyuria NEUROLOGICAL: No headache, dizziness, syncope, paralysis, ataxia, numbness or tingling in the extremities. No change in bowel or bladder control.  MUSCULOSKELETAL: No muscle, back pain, joint pain or stiffness.  LYMPHATICS: No enlarged nodes. No history of splenectomy.  PSYCHIATRIC: No history of  depression or anxiety.  ENDOCRINOLOGIC: No reports of sweating, cold or heat intolerance. No polyuria or polydipsia.  Marland Kitchen.   Physical Examination Vitals:   10/02/16 1431  BP: 122/78  Pulse: 83   Vitals:   10/02/16 1431  Weight: 267 lb (121.1 kg)  Height: 5\' 11"  (1.803 m)    Gen: resting comfortably, no acute distress HEENT: no scleral icterus, pupils equal round and reactive, no palptable cervical adenopathy,  CV: RRR, no m/r/g, no jvd Resp: Clear to auscultation bilaterally GI: abdomen is soft, non-tender, non-distended, normal bowel sounds, no hepatosplenomegaly MSK: extremities are warm, no edema.  Skin: warm, no rash Neuro:  no focal deficits Psych: appropriate affect   Diagnostic Studies 06/2015 echo Study Conclusions  - Left ventricle: The cavity size was normal. Wall thickness was increased in a pattern of mild LVH. Systolic function was mildly to moderately reduced. The estimated ejection fraction was = 40%. No regional wall motion abnormalities. Features are consistent with a pseudonormal left ventricular filling pattern, with concomitant abnormal relaxation and increased filling pressure (grade 2 diastolic dysfunction). - Aortic valve: Mildly calcified annulus. Trileaflet; mildly thickened leaflets. Valve area (VTI): 2.41 cm^2. Valve area (Vmax): 2.49 cm^2. - Mitral valve: There was mild regurgitation. - Left atrium: The atrium was moderately dilated. - Pulmonary veins: There is blunting of systolic pulmonary vein flow consistent with elevated LA pressures. - Atrial septum: No defect or patent foramen ovale was identified. - Technically difficult study. Echo contrast was used to enhance visualization.  Jan 2017 echo Study Conclusions  - Left ventricle: The cavity size was normal. Wall thickness was increased in a pattern of mild LVH. Systolic function was mildly to moderately reduced. The estimated ejection fraction was in  the range of 40% to 45%. Diffuse hypokinesis. Doppler parameters are consistent with abnormal left ventricular relaxation (grade 1 diastolic dysfunction). Normal filling pressures. - Aortic valve: Mildly calcified annulus. Trileaflet; mildly calcified leaflets. - Mitral valve: There was trivial regurgitation. - Left atrium: The atrium was mildly dilated. - Right atrium: Central venous pressure (est): 3 mm Hg. - Tricuspid valve: There was trivial regurgitation. - Pulmonary arteries: Systolic pressure could not be accurately estimated. - Pericardium, extracardiac: There was no pericardial effusion.  Impressions:  - Mild LVH with LVEF approximately 40-45%, diffuse hypokinesis with regional variation. Grade 1 diastolic dysfunction with normal estimated filling pressures. Mild left atrial enlargement. Trivial mitral regurgitation. Mildly sclerotic aortic valve. Trivial tricuspid regurgitation, PASP not estimated.  02/2016 Exercise MPI  Equivocal ST segment abnormalities noted in leads II, III, aVF, and V4 through V6 in the absence of chest pain. Suboptimal heart rate response achieved on treadmill, Lexiscan utilized. Occasional to frequent PVCs.  Blood pressure demonstrated a hypertensive response to exercise.  Moderate-sized, moderate intensity, inferior and basal inferoseptal defect that is fixed and most consistent with soft tissue attenuation, although  scar is also possible.  This is a high risk study based on calculated LVEF.  Nuclear stress EF: 28%.  Assessment and Plan   1. Chronic systolic HF - LVEF 40%, NYHA II. - noninvasive ischemic testing without clear evidence of ICM - continue medical therapy. No current symptoms - EKG in clnic SR, no acute ischemic changes   2. HTN - at goal, he will continue current meds   3. OSA screen - encouraged sleep study once again, he is will to pursue.       Antoine Poche, M.D.

## 2016-10-02 NOTE — Patient Instructions (Signed)
Medication Instructions:  Your physician recommends that you continue on your current medications as directed. Please refer to the Current Medication list given to you today.   Labwork: NONE  Testing/Procedures: NONE  Follow-Up: Your physician wants you to follow-up in: 6 MONTHS.  You will receive a reminder letter in the mail two months in advance. If you don't receive a letter, please call our office to schedule the follow-up appointment.   Any Other Special Instructions Will Be Listed Below (If Applicable). DR. Wyline MoodBRANCH HAS ASKED THAT YOU BE REFERRED TO DR. HAWKINS FOR SLEEP APNEA , SOMEONE FROM HIS OFFICE WILL CONTACT YOU SOON   I HAVE SENT IN REFILLS FOR CARDIAC MEDICATIONS     If you need a refill on your cardiac medications before your next appointment, please call your pharmacy.

## 2016-10-31 DIAGNOSIS — I5022 Chronic systolic (congestive) heart failure: Secondary | ICD-10-CM | POA: Diagnosis not present

## 2016-10-31 DIAGNOSIS — I10 Essential (primary) hypertension: Secondary | ICD-10-CM | POA: Diagnosis not present

## 2016-10-31 DIAGNOSIS — G4733 Obstructive sleep apnea (adult) (pediatric): Secondary | ICD-10-CM | POA: Diagnosis not present

## 2016-10-31 DIAGNOSIS — E669 Obesity, unspecified: Secondary | ICD-10-CM | POA: Diagnosis not present

## 2016-11-04 ENCOUNTER — Other Ambulatory Visit: Payer: Self-pay

## 2016-11-04 DIAGNOSIS — G4730 Sleep apnea, unspecified: Secondary | ICD-10-CM

## 2016-11-10 ENCOUNTER — Ambulatory Visit: Payer: Medicare Other | Attending: Pulmonary Disease | Admitting: Neurology

## 2016-11-10 DIAGNOSIS — G4733 Obstructive sleep apnea (adult) (pediatric): Secondary | ICD-10-CM | POA: Insufficient documentation

## 2016-11-10 DIAGNOSIS — I10 Essential (primary) hypertension: Secondary | ICD-10-CM | POA: Insufficient documentation

## 2016-11-10 DIAGNOSIS — G4730 Sleep apnea, unspecified: Secondary | ICD-10-CM

## 2016-11-13 ENCOUNTER — Other Ambulatory Visit: Payer: Self-pay | Admitting: *Deleted

## 2016-11-13 MED ORDER — FINASTERIDE 5 MG PO TABS: 5.0000 mg | ORAL_TABLET | Freq: Every day | ORAL | 1 refills | Status: DC

## 2016-11-20 NOTE — Procedures (Signed)
[place] A. Merlene Laughter, MD     [email or web address]             NOCTURNAL POLYSOMNOGRAPHY   LOCATION: [facility]   Patient Name: Jeremy Rivera, Jeremy Rivera Date: 11/10/2016 Gender: Male D.O.B: 10-08-1951 Age (years): 65 Referring Provider: Lennox Solders Height (inches): 71 Interpreting Physician: Phillips Odor MD, ABSM Weight (lbs): 267 RPSGT: Rosebud Poles BMI: 37 MRN: [id number] Neck Size: 18.00 CLINICAL INFORMATION Sleep Study Type: Split Night CPAP Indication for sleep study: N/A Epworth Sleepiness Score: 2 SLEEP STUDY TECHNIQUE As per the AASM Manual for the Scoring of Sleep and Associated Events v2.3 (April 2016) with a hypopnea requiring 4% desaturations. The channels recorded and monitored were frontal, central and occipital EEG, electrooculogram (EOG), submentalis EMG (chin), nasal and oral airflow, thoracic and abdominal wall motion, anterior tibialis EMG, snore microphone, electrocardiogram, and pulse oximetry. Continuous positive airway pressure (CPAP) was initiated when the patient met split night criteria and was titrated according to treat sleep-disordered breathing. MEDICATIONS Medications self-administered by patient taken the night of the study : N/A  Current Outpatient Prescriptions:  .  amLODipine-benazepril (LOTREL) 10-40 MG capsule, Take 1 capsule by mouth daily., Disp: 90 capsule, Rfl: 3 .  aspirin EC 81 MG tablet, Take 81 mg by mouth daily., Disp: , Rfl:  .  carvedilol (COREG) 25 MG tablet, TAKE 1 TABLET (25 MG TOTAL) BY MOUTH 2 (TWO) TIMES DAILY WITH A MEAL., Disp: 60 tablet, Rfl: 11 .  carvedilol (COREG) 25 MG tablet, Take 1 tablet (25 mg total) by mouth 2 (two) times daily with a meal., Disp: 180 tablet, Rfl: 3 .  chlorthalidone (HYGROTON) 25 MG tablet, Take 1 tablet (25 mg total) by mouth daily., Disp: 90 tablet, Rfl: 3 .  finasteride (PROSCAR) 5 MG tablet, Take 1 tablet (5 mg total) by mouth daily., Disp: 90 tablet, Rfl: 1 .   glipiZIDE (GLUCOTROL) 5 MG tablet, Take two tablets in the morning and two tablets in the evening., Disp: 360 tablet, Rfl: 1 .  glipiZIDE (GLUCOTROL) 5 MG tablet, TAKE TWO TWICE DAILY, Disp: 120 tablet, Rfl: 5 .  glucose blood (ONE TOUCH ULTRA TEST) test strip, TEST BLOOD SUGAR ONCE DAILY AS DIRECTED **DX E11.9, Disp: 50 each, Rfl: 01 .  spironolactone (ALDACTONE) 25 MG tablet, Take 1 tablet (25 mg total) by mouth daily., Disp: 90 tablet, Rfl: 3  RESPIRATORY PARAMETERS Diagnostic Total AHI (/hr): 55.6 RDI (/hr): 55.6 OA Index (/hr): 4 CA Index (/hr): 3.1 REM AHI (/hr): 80.0 NREM AHI (/hr): 52.2 Supine AHI (/hr): N/A Non-supine AHI (/hr): 55.59 Min O2 Sat (%): 74.00 Mean O2 (%): 92.28 Time below 88% (min): 8.4   Titration Optimal Pressure (cm): 12 AHI at Optimal Pressure (/hr): 4.9 Min O2 at Optimal Pressure (%): 91.0 Supine % at Optimal (%): 0 Sleep % at Optimal (%): 95   SLEEP ARCHITECTURE The recording time for the entire night was 391.0 minutes. During a baseline period of 198.9 minutes, the patient slept for 136.0 minutes in REM and nonREM, yielding a sleep efficiency of 68.4%. Sleep onset after lights out was 24.8 minutes with a REM latency of 138.5 minutes. The patient spent 14.71% of the night in stage N1 sleep, 73.16% in stage N2 sleep, 0.00% in stage N3 and 12.13% in REM. During the titration period of 184.6 minutes, the patient slept for 154.0 minutes in REM and nonREM, yielding a sleep efficiency of 83.4%. Sleep onset after CPAP initiation was 20.9 minutes with a REM latency of 24.5 minutes.  The patient spent 2.60% of the night in stage N1 sleep, 35.06% in stage N2 sleep, 27.60% in stage N3 and 34.74% in REM. CARDIAC DATA The 2 lead EKG demonstrated sinus rhythm. The mean heart rate was N/A beats per minute. Other EKG findings include: PVCs. LEG MOVEMENT DATA The total Periodic Limb Movements of Sleep (PLMS) were 0. The PLMS index was 0.00.   IMPRESSIONS - Severe obstructive sleep  apnea occurred during the diagnostic portion of the study (AHI = 55.6/hour). An optimal CPAP was selected for this patient ( 12 cm of water).  Delano Metz, MD Diplomate, American Board of Sleep Medicine.

## 2016-12-18 ENCOUNTER — Encounter: Payer: Self-pay | Admitting: Family Medicine

## 2016-12-18 ENCOUNTER — Ambulatory Visit (INDEPENDENT_AMBULATORY_CARE_PROVIDER_SITE_OTHER): Payer: Medicare Other | Admitting: Family Medicine

## 2016-12-18 VITALS — BP 128/86 | Ht 70.5 in | Wt 267.0 lb

## 2016-12-18 DIAGNOSIS — E119 Type 2 diabetes mellitus without complications: Secondary | ICD-10-CM

## 2016-12-18 DIAGNOSIS — Z0000 Encounter for general adult medical examination without abnormal findings: Secondary | ICD-10-CM

## 2016-12-18 DIAGNOSIS — E785 Hyperlipidemia, unspecified: Secondary | ICD-10-CM | POA: Diagnosis not present

## 2016-12-18 DIAGNOSIS — Z79899 Other long term (current) drug therapy: Secondary | ICD-10-CM | POA: Diagnosis not present

## 2016-12-18 LAB — POCT GLYCOSYLATED HEMOGLOBIN (HGB A1C): Hemoglobin A1C: 6.9

## 2016-12-18 MED ORDER — FINASTERIDE 5 MG PO TABS: 5.0000 mg | ORAL_TABLET | Freq: Every day | ORAL | 1 refills | Status: DC

## 2016-12-18 MED ORDER — GLIPIZIDE 5 MG PO TABS: ORAL_TABLET | ORAL | 1 refills | Status: DC

## 2016-12-18 NOTE — Progress Notes (Signed)
   Subjective:    Patient ID: Jeremy Rivera, male    DOB: March 28, 1951, 65 y.o.   MRN: [id number]  HPI AWV- Annual Wellness Visit  The patient was seen for their annual wellness visit. The patient's past medical history, surgical history, and family history were reviewed. Pertinent vaccines were reviewed ( tetanus, pneumonia, shingles, flu) The patient's medication list was reviewed and updated.  The height and weight were entered. The patient's current BMI is:  Cognitive screening was completed. Outcome of Mini - Cog:   Falls within the past 6 months:  Current tobacco usage:  (All patients who use tobacco were given written and verbal information on quitting)  Recent listing of emergency department/hospitalizations over the past year were reviewed.  current specialist the patient sees on a regular basis:    Medicare annual wellness visit patient questionnaire was reviewed.  A written screening schedule for the patient for the next 5-10 years was given. Appropriate discussion of followup regarding next visit was discussed.      Review of Systems     Objective:   Physical Exam        Assessment & Plan:

## 2016-12-18 NOTE — Progress Notes (Signed)
Subjective:    Patient ID: Jeremy Rivera, male    DOB: 07/20/1951, 65 y.o.   MRN: [id number]  HPI AWV- Annual Wellness Visit  The patient was seen for their annual wellness visit. The patient's past medical history, surgical history, and family history were reviewed. Pertinent vaccines were reviewed ( tetanus, pneumonia, shingles, flu) The patient's medication list was reviewed and updated.  The height and weight were entered. The patient's current BMI is: 37.77  Cognitive screening was completed. Outcome of Mini - Cog: pass   Falls within the past 6 months: none  Current tobacco usage: none (All patients who use tobacco were given written and verbal information on quitting)  Recent listing of emergency department/hospitalizations over the past year were reviewed.  current specialist the patient sees on a regular basis: Dr. Wyline MoodBranch - heart, Dr. Patsi Searsannenbaum - prostate   Medicare annual wellness visit patient questionnaire was reviewed.  A written screening schedule for the patient for the next 5-10 years was given. Appropriate discussion of followup regarding next visit was discussed.  Diabetes. A1C today 6.9. Has been walking more and eating better.   Results for orders placed or performed in visit on 12/18/16  POCT glycosylated hemoglobin (Hb A1C)  Result Value Ref Range   Hemoglobin A1C 6.9     Glu's running overal pretty well  Blood pressure medicine and blood pressure levels reviewed today with patient. Compliant with blood pressure medicine. States does not miss a dose. No obvious side effects. Blood pressure generally good when checked elsewhere. Watching salt intake.  Patient claims compliance with diabetes medication. No obvious side effects. Reports no substantial low sugar spells. Most numbers are generally in good range when checked fasting. Generally does not miss a dose of medication. Watching diabetic diet closely      Review of Systems  Constitutional:  Negative for activity change, appetite change and fever.  HENT: Negative for congestion and rhinorrhea.   Eyes: Negative for discharge.  Respiratory: Negative for cough and wheezing.   Cardiovascular: Negative for chest pain.  Gastrointestinal: Negative for abdominal pain, blood in stool and vomiting.  Genitourinary: Negative for difficulty urinating and frequency.  Musculoskeletal: Negative for neck pain.  Skin: Negative for rash.  Allergic/Immunologic: Negative for environmental allergies and food allergies.  Neurological: Negative for weakness and headaches.  Psychiatric/Behavioral: Negative for agitation.  All other systems reviewed and are negative.      Objective:   Physical Exam  Constitutional: He appears well-developed and well-nourished.  HENT:  Head: Normocephalic and atraumatic.  Right Ear: External ear normal.  Left Ear: External ear normal.  Nose: Nose normal.  Mouth/Throat: Oropharynx is clear and moist.  Eyes: EOM are normal. Pupils are equal, round, and reactive to light.  Neck: Normal range of motion. Neck supple. No thyromegaly present.  Cardiovascular: Normal rate, regular rhythm and normal heart sounds.   No murmur heard. Pulmonary/Chest: Effort normal and breath sounds normal. No respiratory distress. He has no wheezes.  Abdominal: Soft. Bowel sounds are normal. He exhibits no distension and no mass. There is no tenderness.  Genitourinary: Penis normal.  Musculoskeletal: Normal range of motion. He exhibits no edema.  Lymphadenopathy:    He has no cervical adenopathy.  Neurological: He is alert. He exhibits normal muscle tone.  Skin: Skin is warm and dry. No erythema.  Psychiatric: He has a normal mood and affect. His behavior is normal. Judgment normal.  Vitals reviewed.         Assessment &  Plan:  Impression #1 wellness exam diet exercise vaccines discussed up-to-date on colonoscopy #2 type 2 diabetes good control discussed maintain same meds #3  hypertension good control discussed maintain same plan as noted above. Medications refilled. Diet exercise discussed check in 6 months

## 2016-12-19 LAB — LIPID PANEL
CHOLESTEROL TOTAL: 217 mg/dL — AB (ref 100–199)
Chol/HDL Ratio: 5.4 ratio units — ABNORMAL HIGH (ref 0.0–5.0)
HDL: 40 mg/dL (ref >39)
LDL CALC: 138 mg/dL — AB (ref 0–99)
Triglycerides: 196 mg/dL — ABNORMAL HIGH (ref 0–149)
VLDL Cholesterol Cal: 39 mg/dL (ref 5–40)

## 2016-12-19 LAB — BASIC METABOLIC PANEL
BUN / CREAT RATIO: 16 (ref 10–24)
BUN: 22 mg/dL (ref 8–27)
CALCIUM: 9.8 mg/dL (ref 8.6–10.2)
CO2: 26 mmol/L (ref 18–29)
Chloride: 94 mmol/L — ABNORMAL LOW (ref 96–106)
Creatinine, Ser: 1.38 mg/dL — ABNORMAL HIGH (ref 0.76–1.27)
GFR, EST AFRICAN AMERICAN: 61 mL/min/{1.73_m2} (ref >59)
GFR, EST NON AFRICAN AMERICAN: 53 mL/min/{1.73_m2} — AB (ref >59)
Glucose: 149 mg/dL — ABNORMAL HIGH (ref 65–99)
POTASSIUM: 4.8 mmol/L (ref 3.5–5.2)
Sodium: 136 mmol/L (ref 134–144)

## 2016-12-19 LAB — HEPATIC FUNCTION PANEL
ALBUMIN: 4.7 g/dL (ref 3.6–4.8)
ALT: 15 IU/L (ref 0–44)
AST: 16 IU/L (ref 0–40)
Alkaline Phosphatase: 65 IU/L (ref 39–117)
BILIRUBIN TOTAL: 0.4 mg/dL (ref 0.0–1.2)
BILIRUBIN, DIRECT: 0.12 mg/dL (ref 0.00–0.40)
Total Protein: 7.3 g/dL (ref 6.0–8.5)

## 2016-12-19 LAB — MICROALBUMIN / CREATININE URINE RATIO
Creatinine, Urine: 134.8 mg/dL
MICROALB/CREAT RATIO: 3.4 mg/g{creat} (ref 0.0–30.0)
Microalbumin, Urine: 4.6 ug/mL

## 2016-12-25 ENCOUNTER — Other Ambulatory Visit: Payer: Self-pay | Admitting: *Deleted

## 2016-12-25 DIAGNOSIS — E785 Hyperlipidemia, unspecified: Secondary | ICD-10-CM

## 2016-12-25 DIAGNOSIS — Z79899 Other long term (current) drug therapy: Secondary | ICD-10-CM

## 2016-12-25 MED ORDER — ATORVASTATIN CALCIUM 20 MG PO TABS: 20.0000 mg | ORAL_TABLET | Freq: Every day | ORAL | 3 refills | Status: DC

## 2017-03-31 DIAGNOSIS — R972 Elevated prostate specific antigen [PSA]: Secondary | ICD-10-CM | POA: Diagnosis not present

## 2017-04-07 DIAGNOSIS — R351 Nocturia: Secondary | ICD-10-CM | POA: Diagnosis not present

## 2017-04-07 DIAGNOSIS — Z79899 Other long term (current) drug therapy: Secondary | ICD-10-CM | POA: Diagnosis not present

## 2017-04-07 DIAGNOSIS — N401 Enlarged prostate with lower urinary tract symptoms: Secondary | ICD-10-CM | POA: Diagnosis not present

## 2017-04-07 DIAGNOSIS — R972 Elevated prostate specific antigen [PSA]: Secondary | ICD-10-CM | POA: Diagnosis not present

## 2017-04-07 DIAGNOSIS — E785 Hyperlipidemia, unspecified: Secondary | ICD-10-CM | POA: Diagnosis not present

## 2017-04-07 DIAGNOSIS — N5201 Erectile dysfunction due to arterial insufficiency: Secondary | ICD-10-CM | POA: Diagnosis not present

## 2017-04-08 LAB — LIPID PANEL
CHOL/HDL RATIO: 3.3 ratio (ref 0.0–5.0)
Cholesterol, Total: 117 mg/dL (ref 100–199)
HDL: 35 mg/dL — ABNORMAL LOW (ref >39)
LDL CALC: 58 mg/dL (ref 0–99)
TRIGLYCERIDES: 118 mg/dL (ref 0–149)
VLDL Cholesterol Cal: 24 mg/dL (ref 5–40)

## 2017-04-08 LAB — HEPATIC FUNCTION PANEL
ALT: 17 IU/L (ref 0–44)
AST: 13 IU/L (ref 0–40)
Albumin: 4.5 g/dL (ref 3.6–4.8)
Alkaline Phosphatase: 74 IU/L (ref 39–117)
Bilirubin Total: 0.4 mg/dL (ref 0.0–1.2)
Bilirubin, Direct: 0.09 mg/dL (ref 0.00–0.40)
TOTAL PROTEIN: 7 g/dL (ref 6.0–8.5)

## 2017-04-13 ENCOUNTER — Encounter: Payer: Self-pay | Admitting: Family Medicine

## 2017-04-21 ENCOUNTER — Other Ambulatory Visit: Payer: Self-pay | Admitting: Family Medicine

## 2017-06-18 ENCOUNTER — Ambulatory Visit: Payer: Medicare Other | Admitting: Family Medicine

## 2017-06-26 ENCOUNTER — Encounter: Payer: Self-pay | Admitting: Family Medicine

## 2017-06-26 ENCOUNTER — Ambulatory Visit (INDEPENDENT_AMBULATORY_CARE_PROVIDER_SITE_OTHER): Payer: Medicare Other | Admitting: Family Medicine

## 2017-06-26 VITALS — BP 126/86 | Ht 70.5 in | Wt 268.0 lb

## 2017-06-26 DIAGNOSIS — E7800 Pure hypercholesterolemia, unspecified: Secondary | ICD-10-CM | POA: Diagnosis not present

## 2017-06-26 DIAGNOSIS — E119 Type 2 diabetes mellitus without complications: Secondary | ICD-10-CM | POA: Diagnosis not present

## 2017-06-26 DIAGNOSIS — I10 Essential (primary) hypertension: Secondary | ICD-10-CM

## 2017-06-26 LAB — POCT GLYCOSYLATED HEMOGLOBIN (HGB A1C): Hemoglobin A1C: 6.6

## 2017-06-26 MED ORDER — ATORVASTATIN CALCIUM 20 MG PO TABS: 20.0000 mg | ORAL_TABLET | Freq: Every day | ORAL | 6 refills | Status: DC

## 2017-06-26 MED ORDER — GLIPIZIDE 5 MG PO TABS: ORAL_TABLET | ORAL | 1 refills | Status: DC

## 2017-06-26 MED ORDER — FINASTERIDE 5 MG PO TABS: 5.0000 mg | ORAL_TABLET | Freq: Every day | ORAL | 1 refills | Status: DC

## 2017-06-26 NOTE — Progress Notes (Signed)
Subjective:    Patient ID: Jeremy Rivera, male    DOB: Nov 13, 1951, 66 y.o.   MRN: [id number]  Diabetes  He presents for his follow-up diabetic visit. He has type 2 diabetes mellitus. He has not had a previous visit with a dietitian. He participates in exercise intermittently. He does not see a podiatrist.Eye exam is not current.   Results for orders placed or performed in visit on 06/26/17  POCT glycosylated hemoglobin (Hb A1C)  Result Value Ref Range   Hemoglobin A1C 6.6    Recent Results (from the past 2160 hour(s))  Lipid panel     Status: Abnormal   Collection Time: 04/07/17  8:42 AM  Result Value Ref Range   Cholesterol, Total 117 100 - 199 mg/dL   Triglycerides 409 0 - 149 mg/dL   HDL 35 (L) >81 mg/dL   VLDL Cholesterol Cal 24 5 - 40 mg/dL   LDL Calculated 58 0 - 99 mg/dL   Chol/HDL Ratio 3.3 0.0 - 5.0 ratio    Comment:                                   T. Chol/HDL Ratio                                             Men  Women                               1/2 Avg.Risk  3.4    3.3                                   Avg.Risk  5.0    4.4                                2X Avg.Risk  9.6    7.1                                3X Avg.Risk 23.4   11.0   Hepatic function panel     Status: None   Collection Time: 04/07/17  8:42 AM  Result Value Ref Range   Total Protein 7.0 6.0 - 8.5 g/dL   Albumin 4.5 3.6 - 4.8 g/dL   Bilirubin Total 0.4 0.0 - 1.2 mg/dL   Bilirubin, Direct 1.91 0.00 - 0.40 mg/dL   Alkaline Phosphatase 74 39 - 117 IU/L   AST 13 0 - 40 IU/L   ALT 17 0 - 44 IU/L  POCT glycosylated hemoglobin (Hb A1C)     Status: None   Collection Time: 06/26/17  1:18 PM  Result Value Ref Range   Hemoglobin A1C 6.6    Patient claims compliance with diabetes medication. No obvious side effects. Reports no substantial low sugar spells. Most numbers are generally in good range when checked fasting. Generally does not miss a dose of medication. Watching diabetic diet  closely  Blood pressure medicine and blood pressure levels reviewed today with patient. Compliant with blood pressure medicine. States does not miss a dose. No obvious side  effects. Blood pressure generally good when checked elsewhere. Watching salt intake.   Patient continues to take lipid medication regularly. No obvious side effects from it. Generally does not miss a dose. Prior blood work results are reviewed with patient. Patient continues to work on fat intake in diet  140s or so the numbrs are up t at times  Diet eating overall well            +  Review of Systems No headache, no major weight loss or weight gain, no chest pain no back pain abdominal pain no change in bowel habits complete ROS otherwise negative     Objective:   Physical Exam  Alert and oriented, vitals reviewed and stable, NAD ENT-TM's and ext canals WNL bilat via otoscopic exam Soft palate, tonsils and post pharynx WNL via oropharyngeal exam Neck-symmetric, no masses; thyroid nonpalpable and nontender Pulmonary-no tachypnea or accessory muscle use; Clear without wheezes via auscultation Card--no abnrml murmurs, rhythm reg and rate WNL Carotid pulses symmetric, without bruits       Assessment & Plan:  Impression 1 obesity discussed weight loss intervention discuss #2 hypertension good control discussed maintain same meds #3 type 2 diabetes good control discussed maintain same #4 hyperlipidemia. Continue some recent blood work encouraged to maintain same medications refilled.

## 2017-09-24 DIAGNOSIS — Z23 Encounter for immunization: Secondary | ICD-10-CM | POA: Diagnosis not present

## 2017-10-17 ENCOUNTER — Other Ambulatory Visit: Payer: Self-pay | Admitting: Cardiology

## 2017-10-21 ENCOUNTER — Other Ambulatory Visit: Payer: Self-pay | Admitting: Cardiology

## 2017-11-18 ENCOUNTER — Other Ambulatory Visit: Payer: Self-pay | Admitting: Cardiology

## 2017-12-19 ENCOUNTER — Other Ambulatory Visit: Payer: Self-pay | Admitting: Cardiology

## 2017-12-19 ENCOUNTER — Ambulatory Visit (INDEPENDENT_AMBULATORY_CARE_PROVIDER_SITE_OTHER): Payer: Medicare Other | Admitting: Family Medicine

## 2017-12-19 ENCOUNTER — Encounter: Payer: Self-pay | Admitting: Family Medicine

## 2017-12-19 VITALS — BP 120/78 | Ht 70.0 in | Wt 266.0 lb

## 2017-12-19 DIAGNOSIS — I10 Essential (primary) hypertension: Secondary | ICD-10-CM

## 2017-12-19 DIAGNOSIS — Z0000 Encounter for general adult medical examination without abnormal findings: Secondary | ICD-10-CM

## 2017-12-19 DIAGNOSIS — E7800 Pure hypercholesterolemia, unspecified: Secondary | ICD-10-CM

## 2017-12-19 DIAGNOSIS — Z0001 Encounter for general adult medical examination with abnormal findings: Secondary | ICD-10-CM | POA: Diagnosis not present

## 2017-12-19 DIAGNOSIS — E119 Type 2 diabetes mellitus without complications: Secondary | ICD-10-CM

## 2017-12-19 LAB — POCT GLYCOSYLATED HEMOGLOBIN (HGB A1C): HEMOGLOBIN A1C: 7.7

## 2017-12-19 MED ORDER — METFORMIN HCL 500 MG PO TABS: 500.0000 mg | ORAL_TABLET | Freq: Two times a day (BID) | ORAL | 1 refills | Status: DC

## 2017-12-19 NOTE — Progress Notes (Signed)
Subjective:    Patient ID: Jeremy Rivera, male    DOB: 04-01-1951, 66 y.o.   MRN: [id number]  HPI AWV- Annual Wellness Visit  The patient was seen for their annual wellness visit. The patient's past medical history, surgical history, and family history were reviewed. Pertinent vaccines were reviewed ( tetanus, pneumonia, shingles, flu) The patient's medication list was reviewed and updated.  The height and weight were entered. The patient's current BMI is: 38.17      Cognitive screening was completed. Outcome of Mini - Cog: pass  Falls within the past 6 months: none  Current tobacco usage: none (All patients who use tobacco were given written and verbal information on quitting)  Recent listing of emergency department/hospitalizations over the past year were reviewed.  current specialist the patient sees on a regular basis: none   Medicare annual wellness visit patient questionnaire was reviewed.  A written screening schedule for the patient for the next 5-10 years was given. Appropriate discussion of followup regarding next visit was discussed.  Results for orders placed or performed in visit on 12/19/17  POCT glycosylated hemoglobin (Hb A1C)  Result Value Ref Range   Hemoglobin A1C 7.7    Patient claims compliance with diabetes medication. No obvious side effects. Reports no substantial low sugar spells. Most numbers are generally in good range when checked fasting. Generally does not miss a dose of medication. Watching diabetic diet closely  Patient continues to take lipid medication regularly. No obvious side effects from it. Generally does not miss a dose. Prior blood work results are reviewed with patient. Patient continues to work on fat intake in diet    Review of Systems  Constitutional: Negative for activity change, appetite change and fever.  HENT: Negative for congestion and rhinorrhea.   Eyes: Negative for discharge.  Respiratory: Negative for cough and  wheezing.   Cardiovascular: Negative for chest pain.  Gastrointestinal: Negative for abdominal pain, blood in stool and vomiting.  Genitourinary: Negative for difficulty urinating and frequency.  Musculoskeletal: Negative for neck pain.  Skin: Negative for rash.  Allergic/Immunologic: Negative for environmental allergies and food allergies.  Neurological: Negative for weakness and headaches.  Psychiatric/Behavioral: Negative for agitation.  All other systems reviewed and are negative.      Objective:   Physical Exam  Constitutional: He appears well-developed and well-nourished.  HENT:  Head: Normocephalic and atraumatic.  Right Ear: External ear normal.  Left Ear: External ear normal.  Nose: Nose normal.  Mouth/Throat: Oropharynx is clear and moist.  Eyes: EOM are normal. Pupils are equal, round, and reactive to light.  Neck: Normal range of motion. Neck supple. No thyromegaly present.  Cardiovascular: Normal rate, regular rhythm and normal heart sounds.  No murmur heard. Pulmonary/Chest: Effort normal and breath sounds normal. No respiratory distress. He has no wheezes.  Abdominal: Soft. Bowel sounds are normal. He exhibits no distension and no mass. There is no tenderness.  Genitourinary: Penis normal.  Musculoskeletal: Normal range of motion. He exhibits no edema.  Lymphadenopathy:    He has no cervical adenopathy.  Neurological: He is alert. He exhibits normal muscle tone.  Skin: Skin is warm and dry. No erythema.  Psychiatric: He has a normal mood and affect. His behavior is normal. Judgment normal.  Vitals reviewed.         Assessment & Plan:  Impression wellness exam.  Up-to-date on colonoscopy.  Followed by cardiologist for cardiomyopathy  Hypertension good control discussed to maintain same meds  Type  2 diabetes good control discussed compliance discussed medications maintain  Hyperlipidemia.  Status uncertain.  Need to check levels.  Prior blood work  discussed.  To maintain same for now pending    Appropriate blood work.  Diet exercise discussed medications to maintain follow-up as scheduled in 6 months

## 2017-12-20 ENCOUNTER — Other Ambulatory Visit: Payer: Self-pay | Admitting: Cardiology

## 2017-12-20 LAB — LIPID PANEL
CHOL/HDL RATIO: 3.3 ratio (ref 0.0–5.0)
Cholesterol, Total: 117 mg/dL (ref 100–199)
HDL: 35 mg/dL — ABNORMAL LOW (ref >39)
LDL CALC: 62 mg/dL (ref 0–99)
Triglycerides: 100 mg/dL (ref 0–149)
VLDL Cholesterol Cal: 20 mg/dL (ref 5–40)

## 2017-12-20 LAB — BASIC METABOLIC PANEL
BUN/Creatinine Ratio: 15 (ref 10–24)
BUN: 23 mg/dL (ref 8–27)
CALCIUM: 9.4 mg/dL (ref 8.6–10.2)
CO2: 26 mmol/L (ref 20–29)
CREATININE: 1.52 mg/dL — AB (ref 0.76–1.27)
Chloride: 98 mmol/L (ref 96–106)
GFR calc Af Amer: 54 mL/min/{1.73_m2} — ABNORMAL LOW (ref >59)
GFR, EST NON AFRICAN AMERICAN: 46 mL/min/{1.73_m2} — AB (ref >59)
Glucose: 149 mg/dL — ABNORMAL HIGH (ref 65–99)
POTASSIUM: 5.4 mmol/L — AB (ref 3.5–5.2)
Sodium: 135 mmol/L (ref 134–144)

## 2017-12-20 LAB — MICROALBUMIN / CREATININE URINE RATIO
Creatinine, Urine: 207 mg/dL
MICROALB/CREAT RATIO: 6 mg/g{creat} (ref 0.0–30.0)
MICROALBUM., U, RANDOM: 12.4 ug/mL

## 2017-12-20 LAB — HEPATIC FUNCTION PANEL
ALBUMIN: 4.7 g/dL (ref 3.6–4.8)
ALT: 13 IU/L (ref 0–44)
AST: 15 IU/L (ref 0–40)
Alkaline Phosphatase: 76 IU/L (ref 39–117)
Bilirubin Total: 0.3 mg/dL (ref 0.0–1.2)
Bilirubin, Direct: 0.13 mg/dL (ref 0.00–0.40)
TOTAL PROTEIN: 7.1 g/dL (ref 6.0–8.5)

## 2017-12-24 ENCOUNTER — Other Ambulatory Visit: Payer: Self-pay | Admitting: Cardiology

## 2017-12-24 ENCOUNTER — Encounter: Payer: Self-pay | Admitting: Family Medicine

## 2017-12-26 ENCOUNTER — Encounter: Payer: Self-pay | Admitting: Family Medicine

## 2017-12-26 ENCOUNTER — Ambulatory Visit (INDEPENDENT_AMBULATORY_CARE_PROVIDER_SITE_OTHER): Payer: Medicare Other | Admitting: Family Medicine

## 2017-12-26 VITALS — BP 126/82 | Ht 70.0 in | Wt 266.1 lb

## 2017-12-26 DIAGNOSIS — Z79899 Other long term (current) drug therapy: Secondary | ICD-10-CM

## 2017-12-26 DIAGNOSIS — R739 Hyperglycemia, unspecified: Secondary | ICD-10-CM

## 2017-12-26 DIAGNOSIS — Z13220 Encounter for screening for lipoid disorders: Secondary | ICD-10-CM | POA: Diagnosis not present

## 2017-12-26 DIAGNOSIS — N289 Disorder of kidney and ureter, unspecified: Secondary | ICD-10-CM | POA: Diagnosis not present

## 2017-12-26 NOTE — Progress Notes (Signed)
   Subjective:    Patient ID: Jeremy Rivera, male    DOB: 1951-09-25, 66 y.o.   MRN: [id number]  HPI Patient comes in today to discuss kidney function.   Prior blood work reviewed at great length in presence of patient.   Review of Systems No headache, no major weight loss or weight gain, no chest pain no back pain abdominal pain no change in bowel habits complete ROS otherwise negative     Objective:   Physical Exam   Alert vitals stable, NAD. Blood pressure good on repeat. HEENT normal. Lungs clear. Heart regular rate and rhythm.     Assessment & Plan:   Impression stage III chronic renal insufficiency/disease/discussed at great length.  Patient already on angiotensin blocker.  Long-standing history of diabetes.  See results.  No family history of renal failure.  Encouraged to go on low protein diet.  Will follow met sevens to 6 months.  Other work reviewed and discussed referral to nephrologist at this time which I think is appropriate/other blood work reviewed and discussed .  After careful discussion patient declines Greater than 50% of this 25 minute face to face visit was spent in counseling and discussion and coordination of care regarding the above diagnosis/diagnosies

## 2017-12-26 NOTE — Patient Instructions (Signed)
Results for orders placed or performed in visit on 12/19/17  Lipid panel  Result Value Ref Range   Cholesterol, Total 117 100 - 199 mg/dL   Triglycerides 409100 0 - 149 mg/dL   HDL 35 (L) >81>39 mg/dL   VLDL Cholesterol Cal 20 5 - 40 mg/dL   LDL Calculated 62 0 - 99 mg/dL   Chol/HDL Ratio 3.3 0.0 - 5.0 ratio  Hepatic function panel  Result Value Ref Range   Total Protein 7.1 6.0 - 8.5 g/dL   Albumin 4.7 3.6 - 4.8 g/dL   Bilirubin Total 0.3 0.0 - 1.2 mg/dL   Bilirubin, Direct 1.910.13 0.00 - 0.40 mg/dL   Alkaline Phosphatase 76 39 - 117 IU/L   AST 15 0 - 40 IU/L   ALT 13 0 - 44 IU/L  Basic metabolic panel  Result Value Ref Range   Glucose 149 (H) 65 - 99 mg/dL   BUN 23 8 - 27 mg/dL   Creatinine, Ser 4.781.52 (H) 0.76 - 1.27 mg/dL   GFR calc non Af Amer 46 (L) >59 mL/min/1.73   GFR calc Af Amer 54 (L) >59 mL/min/1.73   BUN/Creatinine Ratio 15 10 - 24   Sodium 135 134 - 144 mmol/L   Potassium 5.4 (H) 3.5 - 5.2 mmol/L   Chloride 98 96 - 106 mmol/L   CO2 26 20 - 29 mmol/L   Calcium 9.4 8.6 - 10.2 mg/dL  Microalbumin / creatinine urine ratio  Result Value Ref Range   Creatinine, Urine 207.0 Not Estab. mg/dL   Albumin, Urine 29.512.4 Not Estab. ug/mL   Microalb/Creat Ratio 6.0 0.0 - 30.0 mg/g creat  POCT glycosylated hemoglobin (Hb A1C)  Result Value Ref Range   Hemoglobin A1C 7.7

## 2018-01-20 ENCOUNTER — Other Ambulatory Visit: Payer: Self-pay | Admitting: Cardiology

## 2018-01-24 ENCOUNTER — Other Ambulatory Visit: Payer: Self-pay | Admitting: Cardiology

## 2018-02-05 ENCOUNTER — Ambulatory Visit (INDEPENDENT_AMBULATORY_CARE_PROVIDER_SITE_OTHER): Payer: Medicare Other | Admitting: Family Medicine

## 2018-02-05 ENCOUNTER — Encounter: Payer: Self-pay | Admitting: Family Medicine

## 2018-02-05 ENCOUNTER — Ambulatory Visit (HOSPITAL_COMMUNITY)
Admission: RE | Admit: 2018-02-05 | Discharge: 2018-02-05 | Disposition: A | Discharge status: Home / Self Care | Payer: Medicare Other | Source: Ambulatory Visit | Attending: Family Medicine | Admitting: Family Medicine

## 2018-02-05 VITALS — BP 112/78 | Temp 98.4°F | Ht 70.0 in | Wt 267.6 lb

## 2018-02-05 DIAGNOSIS — M25461 Effusion, right knee: Secondary | ICD-10-CM | POA: Diagnosis not present

## 2018-02-05 DIAGNOSIS — M25561 Pain in right knee: Secondary | ICD-10-CM | POA: Insufficient documentation

## 2018-02-05 NOTE — Progress Notes (Signed)
   Subjective:    Patient ID: Jeremy Rivera, male    DOB: [birth year], [age] y.o.   MRN: [id number]  Knee Pain   The incident occurred 3 to 5 days ago. The incident occurred at home. The pain is present in the right knee. The quality of the pain is described as aching. The symptoms are aggravated by movement. Treatments tried: knee brace, pain relieving cream. The treatment provided mild relief.   Pt stated he was walking downstairs and his knee "gave out". Pt stated that his knee "popped" Tuesday and afterwards had extricating pain.  Bad sudden pain giving away sensation  hrt a lot one anf s hslf wk sgo  Got better ovrall  sund went to church standing a ot  Felt a popping sens  Almost hurt it,  Feels bad anf achey and maybe swollen  Review of Systems No headache, no major weight loss or weight gain, no chest pain no back pain abdominal pain no change in bowel habits complete ROS otherwise negative     Objective:   Physical Exam Alert vitals stable, NAD. Blood pressure good on repeat. HEENT normal. Lungs clear. Heart regular rate and rhythm. Right knee positive crepitations no true joint laxity no medial joint line tenderness some lateral tenderness to deep palpation       Assessment & Plan:  Impression knee injury.  Hopefully strain not true meniscal tear discussed x-ray knee may take over-the-counter Advil may add Tylenol as needed proper use of cane discussed

## 2018-03-12 ENCOUNTER — Other Ambulatory Visit: Payer: Self-pay | Admitting: Cardiology

## 2018-03-13 ENCOUNTER — Other Ambulatory Visit: Payer: Self-pay | Admitting: Cardiology

## 2018-03-17 ENCOUNTER — Other Ambulatory Visit: Payer: Self-pay | Admitting: *Deleted

## 2018-03-17 MED ORDER — ATORVASTATIN CALCIUM 20 MG PO TABS: 20.0000 mg | ORAL_TABLET | Freq: Every day | ORAL | 0 refills | Status: DC

## 2018-03-20 ENCOUNTER — Other Ambulatory Visit: Payer: Self-pay | Admitting: Family Medicine

## 2018-03-21 ENCOUNTER — Other Ambulatory Visit: Payer: Self-pay | Admitting: Cardiology

## 2018-03-23 ENCOUNTER — Other Ambulatory Visit: Payer: Self-pay | Admitting: Cardiology

## 2018-03-28 LAB — GLUCOSE, POCT (MANUAL RESULT ENTRY): POC Glucose: 248 mg/dl — AB (ref 70–99)

## 2018-03-28 LAB — POCT GLYCOSYLATED HEMOGLOBIN (HGB A1C): Hemoglobin A1C: 6.9

## 2018-04-04 ENCOUNTER — Other Ambulatory Visit: Payer: Self-pay | Admitting: Cardiology

## 2018-04-11 ENCOUNTER — Other Ambulatory Visit: Payer: Self-pay | Admitting: Cardiology

## 2018-04-13 DIAGNOSIS — N401 Enlarged prostate with lower urinary tract symptoms: Secondary | ICD-10-CM | POA: Diagnosis not present

## 2018-04-13 DIAGNOSIS — R351 Nocturia: Secondary | ICD-10-CM | POA: Diagnosis not present

## 2018-04-13 DIAGNOSIS — R972 Elevated prostate specific antigen [PSA]: Secondary | ICD-10-CM | POA: Diagnosis not present

## 2018-04-13 DIAGNOSIS — N5201 Erectile dysfunction due to arterial insufficiency: Secondary | ICD-10-CM | POA: Diagnosis not present

## 2018-05-14 ENCOUNTER — Other Ambulatory Visit: Payer: Self-pay | Admitting: Cardiology

## 2018-06-05 ENCOUNTER — Other Ambulatory Visit: Payer: Self-pay | Admitting: Cardiology

## 2018-06-12 DIAGNOSIS — Z79899 Other long term (current) drug therapy: Secondary | ICD-10-CM | POA: Diagnosis not present

## 2018-06-12 DIAGNOSIS — Z13220 Encounter for screening for lipoid disorders: Secondary | ICD-10-CM | POA: Diagnosis not present

## 2018-06-12 DIAGNOSIS — R739 Hyperglycemia, unspecified: Secondary | ICD-10-CM | POA: Diagnosis not present

## 2018-06-12 DIAGNOSIS — N289 Disorder of kidney and ureter, unspecified: Secondary | ICD-10-CM | POA: Diagnosis not present

## 2018-06-13 LAB — BASIC METABOLIC PANEL
BUN/Creatinine Ratio: 12 (ref 10–24)
BUN: 14 mg/dL (ref 8–27)
CALCIUM: 9.6 mg/dL (ref 8.6–10.2)
CO2: 28 mmol/L (ref 20–29)
CREATININE: 1.18 mg/dL (ref 0.76–1.27)
Chloride: 99 mmol/L (ref 96–106)
GFR calc Af Amer: 72 mL/min/{1.73_m2} (ref >59)
GFR calc non Af Amer: 63 mL/min/{1.73_m2} (ref >59)
GLUCOSE: 120 mg/dL — AB (ref 65–99)
POTASSIUM: 5.3 mmol/L — AB (ref 3.5–5.2)
SODIUM: 139 mmol/L (ref 134–144)

## 2018-06-13 LAB — LIPID PANEL
CHOLESTEROL TOTAL: 102 mg/dL (ref 100–199)
Chol/HDL Ratio: 2.6 ratio (ref 0.0–5.0)
HDL: 39 mg/dL — ABNORMAL LOW (ref >39)
LDL Calculated: 42 mg/dL (ref 0–99)
TRIGLYCERIDES: 104 mg/dL (ref 0–149)
VLDL Cholesterol Cal: 21 mg/dL (ref 5–40)

## 2018-06-13 LAB — HEPATIC FUNCTION PANEL
ALT: 18 IU/L (ref 0–44)
AST: 16 IU/L (ref 0–40)
Albumin: 4.4 g/dL (ref 3.6–4.8)
Alkaline Phosphatase: 75 IU/L (ref 39–117)
BILIRUBIN TOTAL: 0.5 mg/dL (ref 0.0–1.2)
BILIRUBIN, DIRECT: 0.15 mg/dL (ref 0.00–0.40)
Total Protein: 6.7 g/dL (ref 6.0–8.5)

## 2018-06-13 LAB — HEMOGLOBIN A1C
Est. average glucose Bld gHb Est-mCnc: 146 mg/dL
Hgb A1c MFr Bld: 6.7 % — ABNORMAL HIGH (ref 4.8–5.6)

## 2018-06-16 ENCOUNTER — Other Ambulatory Visit: Payer: Self-pay | Admitting: Family Medicine

## 2018-06-19 ENCOUNTER — Ambulatory Visit (INDEPENDENT_AMBULATORY_CARE_PROVIDER_SITE_OTHER): Payer: Medicare Other | Admitting: Family Medicine

## 2018-06-19 ENCOUNTER — Encounter: Payer: Self-pay | Admitting: Family Medicine

## 2018-06-19 VITALS — BP 110/72 | Ht 70.0 in | Wt 267.2 lb

## 2018-06-19 DIAGNOSIS — I10 Essential (primary) hypertension: Secondary | ICD-10-CM

## 2018-06-19 DIAGNOSIS — E119 Type 2 diabetes mellitus without complications: Secondary | ICD-10-CM

## 2018-06-19 DIAGNOSIS — E7800 Pure hypercholesterolemia, unspecified: Secondary | ICD-10-CM | POA: Diagnosis not present

## 2018-06-19 MED ORDER — ATORVASTATIN CALCIUM 20 MG PO TABS: 20.0000 mg | ORAL_TABLET | Freq: Every day | ORAL | 1 refills | Status: DC

## 2018-06-19 MED ORDER — METFORMIN HCL 500 MG PO TABS: 500.0000 mg | ORAL_TABLET | Freq: Two times a day (BID) | ORAL | 1 refills | Status: DC

## 2018-06-19 MED ORDER — AMLODIPINE BESY-BENAZEPRIL HCL 10-40 MG PO CAPS: ORAL_CAPSULE | ORAL | 1 refills | Status: DC

## 2018-06-19 MED ORDER — GLIPIZIDE 5 MG PO TABS: ORAL_TABLET | ORAL | 0 refills | Status: DC

## 2018-06-19 MED ORDER — FINASTERIDE 5 MG PO TABS: 5.0000 mg | ORAL_TABLET | Freq: Every day | ORAL | 1 refills | Status: DC

## 2018-06-19 NOTE — Patient Instructions (Signed)
Recent Results (from the past 2160 hour(s))  POCT glucose (manual entry)     Status: Abnormal   Collection Time: 03/28/18  4:42 PM  Result Value Ref Range   POC Glucose 248 (A) 70 - 99 mg/dl    Comment: [place] [facility]IST EVENT  POCT glycosylated hemoglobin (Hb A1C)     Status: None   Collection Time: 03/28/18  4:42 PM  Result Value Ref Range   Hemoglobin A1C 6.9%     Comment: [place] [facility] EVENT  Basic metabolic panel     Status: Abnormal   Collection Time: 06/12/18  9:36 AM  Result Value Ref Range   Glucose 120 (H) 65 - 99 mg/dL   BUN 14 8 - 27 mg/dL   Creatinine, Ser 7.82 0.76 - 1.27 mg/dL   GFR calc non Af Amer 63 >59 mL/min/1.73   GFR calc Af Amer 72 >59 mL/min/1.73   BUN/Creatinine Ratio 12 10 - 24   Sodium 139 134 - 144 mmol/L   Potassium 5.3 (H) 3.5 - 5.2 mmol/L   Chloride 99 96 - 106 mmol/L   CO2 28 20 - 29 mmol/L   Calcium 9.6 8.6 - 10.2 mg/dL  Lipid panel     Status: Abnormal   Collection Time: 06/12/18  9:36 AM  Result Value Ref Range   Cholesterol, Total 102 100 - 199 mg/dL   Triglycerides 956 0 - 149 mg/dL   HDL 39 (L) >21 mg/dL   VLDL Cholesterol Cal 21 5 - 40 mg/dL   LDL Calculated 42 0 - 99 mg/dL   Chol/HDL Ratio 2.6 0.0 - 5.0 ratio    Comment:                                   T. Chol/HDL Ratio                                             Men  Women                               1/2 Avg.Risk  3.4    3.3                                   Avg.Risk  5.0    4.4                                2X Avg.Risk  9.6    7.1                                3X Avg.Risk 23.4   11.0   Hepatic function panel     Status: None   Collection Time: 06/12/18  9:36 AM  Result Value Ref Range   Total Protein 6.7 6.0 - 8.5 g/dL   Albumin 4.4 3.6 - 4.8 g/dL   Bilirubin Total 0.5 0.0 - 1.2 mg/dL   Bilirubin, Direct 3.08 0.00 - 0.40 mg/dL   Alkaline Phosphatase 75 39 - 117 IU/L   AST 16 0 - 40 IU/L  ALT 18 0 - 44 IU/L  Hemoglobin A1c     Status: Abnormal   Collection Time: 06/12/18   9:36 AM  Result Value Ref Range   Hgb A1c MFr Bld 6.7 (H) 4.8 - 5.6 %    Comment:          Prediabetes: 5.7 - 6.4          Diabetes: >6.4          Glycemic control for adults with diabetes: <7.0    Est. average glucose Bld gHb Est-mCnc 146 mg/dL

## 2018-06-19 NOTE — Progress Notes (Signed)
Subjective:    Patient ID: Jeremy Rivera, male    DOB: Jun 06, 1951, 67 y.o.   MRN: [id number]   Diabetes   He presents for his follow-up diabetic visit. He has type 2 diabetes mellitus. Risk factors for coronary artery disease include diabetes mellitus, dyslipidemia and hypertension. Current diabetic treatment includes oral agent (dual therapy). He is compliant with treatment all of the time. His weight is stable. He is following a diabetic diet. He  has not had a previous visit with a dietitian. He  does not see a podiatrist. Eye exam is not current.   Discuss recent labs  Results for orders placed or performed in visit on 03/28/18  POCT glucose (manual entry)  Result Value Ref Range   POC Glucose 248 (A) 70 - 99 mg/dl  POCT glycosylated hemoglobin (Hb A1C)  Result Value Ref Range   Hemoglobin A1C 6.9%     Recent Results (from the past 2160 hour(s))  POCT glucose (manual entry)     Status: Abnormal   Collection Time: 03/28/18  4:42 PM  Result Value Ref Range   POC Glucose 248 (A) 70 - 99 mg/dl    Comment: [place] [facility]IST EVENT  POCT glycosylated hemoglobin (Hb A1C)     Status: None   Collection Time: 03/28/18  4:42 PM  Result Value Ref Range   Hemoglobin A1C 6.9%     Comment: [place] [facility] EVENT  Basic metabolic panel     Status: Abnormal   Collection Time: 06/12/18  9:36 AM  Result Value Ref Range   Glucose 120 (H) 65 - 99 mg/dL   BUN 14 8 - 27 mg/dL   Creatinine, Ser 4.09 0.76 - 1.27 mg/dL   GFR calc non Af Amer 63 >59 mL/min/1.73   GFR calc Af Amer 72 >59 mL/min/1.73   BUN/Creatinine Ratio 12 10 - 24   Sodium 139 134 - 144 mmol/L   Potassium 5.3 (H) 3.5 - 5.2 mmol/L   Chloride 99 96 - 106 mmol/L   CO2 28 20 - 29 mmol/L   Calcium 9.6 8.6 - 10.2 mg/dL  Lipid panel     Status: Abnormal   Collection Time: 06/12/18  9:36 AM  Result Value Ref Range   Cholesterol, Total 102 100 - 199 mg/dL   Triglycerides 811 0 - 149 mg/dL   HDL 39 (L) >91 mg/dL   VLDL Cholesterol Cal  21 5 - 40 mg/dL   LDL Calculated 42 0 - 99 mg/dL   Chol/HDL Ratio 2.6 0.0 - 5.0 ratio    Comment:                                   T. Chol/HDL Ratio                                             Men  Women                               1/2 Avg.Risk  3.4    3.3                                   Avg.Risk  5.0    4.4                                2X Avg.Risk  9.6    7.1                                3X Avg.Risk 23.4   11.0   Hepatic function panel     Status: None   Collection Time: 06/12/18  9:36 AM  Result Value Ref Range   Total Protein 6.7 6.0 - 8.5 g/dL   Albumin 4.4 3.6 - 4.8 g/dL   Bilirubin Total 0.5 0.0 - 1.2 mg/dL   Bilirubin, Direct 4.090.15 0.00 - 0.40 mg/dL   Alkaline Phosphatase 75 39 - 117 IU/L   AST 16 0 - 40 IU/L   ALT 18 0 - 44 IU/L  Hemoglobin A1c     Status: Abnormal   Collection Time: 06/12/18  9:36 AM  Result Value Ref Range   Hgb A1c MFr Bld 6.7 (H) 4.8 - 5.6 %    Comment:          Prediabetes: 5.7 - 6.4          Diabetes: >6.4          Glycemic control for adults with diabetes: <7.0    Est. average glucose Bld gHb Est-mCnc 146 mg/dL   Patient claims compliance with diabetes medication. No obvious side effects. Reports no substantial low sugar spells. Most numbers are generally in good range when checked fasting. Generally does not miss a dose of medication. Watching diabetic diet closely  Blood pressure medicine and blood pressure levels reviewed today with patient. Compliant with blood pressure medicine. States does not miss a dose. No obvious side effects. Blood pressure generally good when checked elsewhere. Watching salt intake.   Patient continues to take lipid medication regularly. No obvious side effects from it. Generally does not miss a dose. Prior blood work results are reviewed with patient. Patient continues to work on fat intake in diet   Review of Systems No headache, no major weight loss or weight gain, no chest pain no back pain abdominal pain  no change in bowel habits complete ROS otherwise negative     Objective:   Physical Exam  Alert and oriented, vitals reviewed and stable, NAD ENT-TM's and ext canals WNL bilat via otoscopic exam Soft palate, tonsils and post pharynx WNL via oropharyngeal exam Neck-symmetric, no masses; thyroid nonpalpable and nontender Pulmonary-no tachypnea or accessory muscle use; Clear without wheezes via auscultation Card--no abnrml murmurs, rhythm reg and rate WNL Carotid pulses symmetric, without bruits       Assessment & Plan:  1.2Impression diabetes.  Type II.  Excellent control.  Discussed.  Diet exercise discussed.  Compliance with medicines discussed  2.  Hypertension.  Good control.  Discussed.  Compliance discussed.  No obvious side effects.  Blood pressure is good  3.  Hyperlipidemia.  Prior blood work reviewed.  Appropriate level of control discussed compliance with medicines discussed  Greater than 50% of this 25 minute face to face visit was spent in counseling and discussion and coordination of care regarding the above diagnosis/diagnosies

## 2018-07-17 ENCOUNTER — Other Ambulatory Visit: Payer: Self-pay | Admitting: Cardiology

## 2018-07-21 ENCOUNTER — Ambulatory Visit (INDEPENDENT_AMBULATORY_CARE_PROVIDER_SITE_OTHER): Payer: Medicare Other | Admitting: Family Medicine

## 2018-07-21 ENCOUNTER — Encounter: Payer: Self-pay | Admitting: Family Medicine

## 2018-07-21 VITALS — BP 142/90 | Temp 99.0°F | Ht 70.0 in | Wt 267.0 lb

## 2018-07-21 DIAGNOSIS — M7989 Other specified soft tissue disorders: Secondary | ICD-10-CM

## 2018-07-21 DIAGNOSIS — R21 Rash and other nonspecific skin eruption: Secondary | ICD-10-CM | POA: Diagnosis not present

## 2018-07-21 DIAGNOSIS — I878 Other specified disorders of veins: Secondary | ICD-10-CM | POA: Diagnosis not present

## 2018-07-21 MED ORDER — TRIAMCINOLONE ACETONIDE 0.1 % EX CREA: TOPICAL_CREAM | CUTANEOUS | 2 refills | Status: DC

## 2018-07-21 NOTE — Progress Notes (Signed)
   Subjective:    Patient ID: Jeremy Rivera, male    DOB: 1951/02/05, 67 y.o.   MRN: [id number]  HPI Pt here today for swelling and redness in right ankle. Pt states heat makes it worse. Pt states it feels like a sunburn. Started about a month ago.   Hx of knee injury way back n jan  Over the past mo or so pt notes a bit of stinging and burning in the ankle and the foot  Pt had spouse look at it   Patient has had swelling in both ankles and feet right more so than left.  Admits to a fair amount of salt intake.  No shortness of breath no chest pain     Review of Systems No headache, no major weight loss or weight gain, no chest pain no back pain abdominal pain no change in bowel habits complete ROS otherwise negative     Objective:   Physical Exam  Alert and oriented, vitals reviewed and stable, NAD ENT-TM's and ext canals WNL bilat via otoscopic exam Soft palate, tonsils and post pharynx WNL via oropharyngeal exam Neck-symmetric, no masses; thyroid nonpalpable and nontender Pulmonary-no tachypnea or accessory muscle use; Clear without wheezes via auscultation Card--no abnrml murmurs, rhythm reg and rate WNL Carotid pulses symmetric, without bruits Right ankle 1+ edema left ankle 1/2+ edema      Assessment & Plan:  Impression probable venous stasis discussed.  Exacerbated by inflammation in the right leg.  Also exacerbated by obesity.  Also exacerbated by amlodipine.  Patient has also element of venous stasis dermatitis.  Steroid cream prescribed for this.  Prefer to not go to high-dose diuretics rationale discussed.  Ultrasound right leg to rule out deep venous involvement.  Though unlikely.  Addendum ultrasound negative.  Greater than 50% of this 25 minute face to face visit was spent in counseling and discussion and coordination of care regarding the above diagnosis/diagnosies

## 2018-07-24 ENCOUNTER — Ambulatory Visit (HOSPITAL_COMMUNITY)
Admission: RE | Admit: 2018-07-24 | Discharge: 2018-07-24 | Disposition: A | Discharge status: Home / Self Care | Payer: Medicare Other | Source: Ambulatory Visit | Attending: Family Medicine | Admitting: Family Medicine

## 2018-07-24 DIAGNOSIS — M7989 Other specified soft tissue disorders: Secondary | ICD-10-CM | POA: Diagnosis not present

## 2018-07-24 DIAGNOSIS — R600 Localized edema: Secondary | ICD-10-CM | POA: Diagnosis not present

## 2018-08-18 ENCOUNTER — Other Ambulatory Visit: Payer: Self-pay | Admitting: Cardiology

## 2018-09-17 ENCOUNTER — Other Ambulatory Visit: Payer: Self-pay | Admitting: Family Medicine

## 2018-09-17 ENCOUNTER — Other Ambulatory Visit: Payer: Self-pay | Admitting: Cardiology

## 2018-09-30 DIAGNOSIS — Z23 Encounter for immunization: Secondary | ICD-10-CM | POA: Diagnosis not present

## 2018-10-15 ENCOUNTER — Other Ambulatory Visit: Payer: Self-pay | Admitting: Cardiology

## 2018-11-01 ENCOUNTER — Other Ambulatory Visit: Payer: Self-pay | Admitting: Cardiology

## 2018-11-11 ENCOUNTER — Other Ambulatory Visit: Payer: Self-pay

## 2018-11-16 ENCOUNTER — Other Ambulatory Visit: Payer: Self-pay | Admitting: Cardiology

## 2018-11-18 ENCOUNTER — Other Ambulatory Visit: Payer: Self-pay

## 2018-12-16 ENCOUNTER — Other Ambulatory Visit: Payer: Self-pay | Admitting: Family Medicine

## 2018-12-18 ENCOUNTER — Other Ambulatory Visit: Payer: Self-pay | Admitting: Family Medicine

## 2018-12-21 ENCOUNTER — Encounter: Payer: Self-pay | Admitting: Family Medicine

## 2018-12-21 ENCOUNTER — Ambulatory Visit (INDEPENDENT_AMBULATORY_CARE_PROVIDER_SITE_OTHER): Payer: Medicare Other | Admitting: Family Medicine

## 2018-12-21 VITALS — BP 142/92 | Ht 69.25 in | Wt 265.0 lb

## 2018-12-21 DIAGNOSIS — I878 Other specified disorders of veins: Secondary | ICD-10-CM

## 2018-12-21 DIAGNOSIS — N5201 Erectile dysfunction due to arterial insufficiency: Secondary | ICD-10-CM

## 2018-12-21 DIAGNOSIS — Z79899 Other long term (current) drug therapy: Secondary | ICD-10-CM | POA: Diagnosis not present

## 2018-12-21 DIAGNOSIS — I10 Essential (primary) hypertension: Secondary | ICD-10-CM | POA: Diagnosis not present

## 2018-12-21 DIAGNOSIS — R21 Rash and other nonspecific skin eruption: Secondary | ICD-10-CM | POA: Diagnosis not present

## 2018-12-21 DIAGNOSIS — E7800 Pure hypercholesterolemia, unspecified: Secondary | ICD-10-CM | POA: Diagnosis not present

## 2018-12-21 DIAGNOSIS — Z0000 Encounter for general adult medical examination without abnormal findings: Secondary | ICD-10-CM | POA: Diagnosis not present

## 2018-12-21 DIAGNOSIS — Z13220 Encounter for screening for lipoid disorders: Secondary | ICD-10-CM

## 2018-12-21 DIAGNOSIS — E119 Type 2 diabetes mellitus without complications: Secondary | ICD-10-CM

## 2018-12-21 LAB — POCT GLYCOSYLATED HEMOGLOBIN (HGB A1C): Hemoglobin A1C: 6.1 % — AB (ref 4.0–5.6)

## 2018-12-21 MED ORDER — AMLODIPINE BESY-BENAZEPRIL HCL 10-40 MG PO CAPS: 1.0000 | ORAL_CAPSULE | Freq: Every day | ORAL | 1 refills | Status: DC

## 2018-12-21 MED ORDER — METFORMIN HCL 500 MG PO TABS: 500.0000 mg | ORAL_TABLET | Freq: Two times a day (BID) | ORAL | 1 refills | Status: DC

## 2018-12-21 MED ORDER — FINASTERIDE 5 MG PO TABS: 5.0000 mg | ORAL_TABLET | Freq: Every day | ORAL | 1 refills | Status: DC

## 2018-12-21 MED ORDER — TRIAMCINOLONE ACETONIDE 0.1 % EX CREA: TOPICAL_CREAM | CUTANEOUS | 2 refills | Status: DC

## 2018-12-21 MED ORDER — ATORVASTATIN CALCIUM 20 MG PO TABS: 20.0000 mg | ORAL_TABLET | Freq: Every day | ORAL | 1 refills | Status: DC

## 2018-12-21 MED ORDER — GLIPIZIDE 5 MG PO TABS: ORAL_TABLET | ORAL | 1 refills | Status: DC

## 2018-12-21 MED ORDER — GLUCOSE BLOOD VI STRP: ORAL_STRIP | Status: DC

## 2018-12-21 NOTE — Progress Notes (Signed)
Subjective:    Patient ID: Jeremy Rivera, male    DOB: 17-Oct-1951, 67 y.o.   MRN: [id number]  HPI AWV- Annual Wellness Visit  The patient was seen for their annual wellness visit. The patient's past medical history, surgical history, and family history were reviewed. Pertinent vaccines were reviewed ( tetanus, pneumonia, shingles, flu) The patient's medication list was reviewed and updated.  The height and weight were entered.  BMI recorded in electronic record elsewhere  Cognitive screening was completed. Outcome of Mini - Cog:    Falls /depression screening electronically recorded within record elsewhere None  Current tobacco usage:No (All patients who use tobacco were given written and verbal information on quitting)  Recent listing of emergency department/hospitalizations over the past year were reviewed.  current specialist the patient sees on a regular basis: Urologist yearly   Medicare annual wellness visit patient questionnaire was reviewed.  A written screening schedule for the patient for the next 5-10 years was given. Appropriate discussion of followup regarding next visit was discussed.  He is also here today to discuss his chronic illnesses. He has a history of Dm. He takes Glipizide 5 mg two po Bid, and Metformin 500 mg one po Bid  ptostate b w   Notes rash      Results for orders placed or performed in visit on 12/21/18  POCT glycosylated hemoglobin (Hb A1C)  Result Value Ref Range   Hemoglobin A1C 6.1 (A) 4.0 - 5.6 %   HbA1c POC (<> result, manual entry)     HbA1c, POC (prediabetic range)     HbA1c, POC (controlled diabetic range)     Patient claims compliance with diabetes medication. No obvious side effects. Reports no substantial low sugar spells. Most numbers are generally in good range when checked fasting. Generally does not miss a dose of medication. Watching diabetic diet closely  Blood pressure medicine and blood pressure levels reviewed  today with patient. Compliant with blood pressure medicine. States does not miss a dose. No obvious side effects. Blood pressure generally good when checked elsewhere. Watching salt intake.   Dr Patsi Searstannenbaum, watching prostate test     Patient continues to take lipid medication regularly.  Then he stopped 6 weeks ago stating it was causing a rash on his ankle.  Positive history of venous stasis dermatitis in the past.  No obvious achiness in joints or muscles.. Prior blood work results are reviewed with patient. Patient continues to work on fat intake in diet  Recent Results (from the past 2160 hour(s))  POCT glycosylated hemoglobin (Hb A1C)     Status: Abnormal   Collection Time: 12/21/18  9:31 AM  Result Value Ref Range   Hemoglobin A1C 6.1 (A) 4.0 - 5.6 %   HbA1c POC (<> result, manual entry)     HbA1c, POC (prediabetic range)     HbA1c, POC (controlled diabetic range)     bp monisots number  Decent at home 120 over upper 70s  Pt developed a bit of a rash in his lower legs   Glu self ck s good   Off lipitor six weeks or so      Pt states cannot take chol meds   Review of Systems  Constitutional: Negative for activity change, appetite change and fever.  HENT: Negative for congestion and rhinorrhea.   Eyes: Negative for discharge.  Respiratory: Negative for cough and wheezing.   Cardiovascular: Negative for chest pain.  Gastrointestinal: Negative for abdominal pain, blood in stool  and vomiting.  Genitourinary: Negative for difficulty urinating and frequency.  Musculoskeletal: Negative for neck pain.  Skin: Negative for rash.  Allergic/Immunologic: Negative for environmental allergies and food allergies.  Neurological: Negative for weakness and headaches.  Psychiatric/Behavioral: Negative for agitation.  All other systems reviewed and are negative.      Objective:   Physical Exam Vitals signs reviewed.  Constitutional:      Appearance: He is well-developed. He is obese.   HENT:     Head: Normocephalic and atraumatic.     Right Ear: External ear normal.     Left Ear: External ear normal.     Nose: Nose normal.  Eyes:     Pupils: Pupils are equal, round, and reactive to light.  Neck:     Musculoskeletal: Normal range of motion and neck supple.     Thyroid: No thyromegaly.  Cardiovascular:     Rate and Rhythm: Normal rate and regular rhythm.     Heart sounds: Normal heart sounds. No murmur.  Pulmonary:     Effort: Pulmonary effort is normal. No respiratory distress.     Breath sounds: Normal breath sounds. No wheezing.  Abdominal:     General: Bowel sounds are normal. There is no distension.     Palpations: Abdomen is soft. There is no mass.     Tenderness: There is no abdominal tenderness.  Genitourinary:    Penis: Normal.   Musculoskeletal: Normal range of motion.  Lymphadenopathy:     Cervical: No cervical adenopathy.  Skin:    General: Skin is warm and dry.     Findings: No erythema.     Comments: Ankles 1+ edema bilateral.  Positive venous stasis dermatitis right ankle and anterior lower leg  Neurological:     Mental Status: He is alert.     Motor: No abnormal muscle tone.  Psychiatric:        Behavior: Behavior normal.        Judgment: Judgment normal.           Assessment & Plan:  Impression wellness exam.  Diet discussed.  Exercise discussed vaccines discussed and clarified.  Colon done in 2016, next one due in six yrs .  Sees specialist yearly for prostate evaluation  2.  Type 2 diabetes with excellent control discussed maintain same  3.  Hypertension.  Compliance discussed.  Blood pressure is borderline high today but generally in good control, to maintain same  4.  Hyperlipidemia.  Noncompliance with medication.  Strongly encouraged patient get back on Lipitor.  Rationale discussed.  I doubt the cause rash  5 rash venous stasis dermatitis discussed initiate triamcinolone twice daily to affected area  Medications  refilled.  Blood work ordered.  Wait a month.  Diet exercise discussed.  Vaccines clarifiedFollow-up in 6 months

## 2019-02-07 IMAGING — US US EXTREM LOW VENOUS*R*
1 series · 13 of 24 positions shown · non-contrast
Comparison: None.

CLINICAL DATA: Chronic right lower extremity pain and edema since
falling off a step approximately 6 7 months ago. Evaluate for DVT.



[Series 1: us extrem low venous*right* · 0.08mm/px · 13 of 38 slices shown]
[im 1/38]
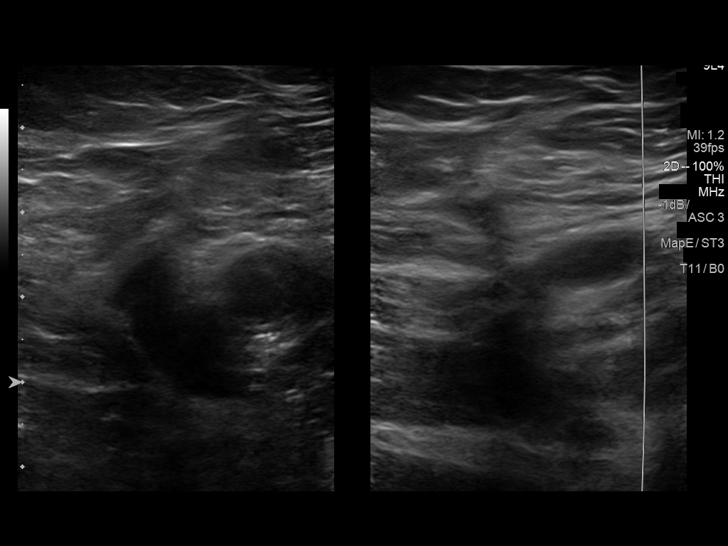
[im 4/38]
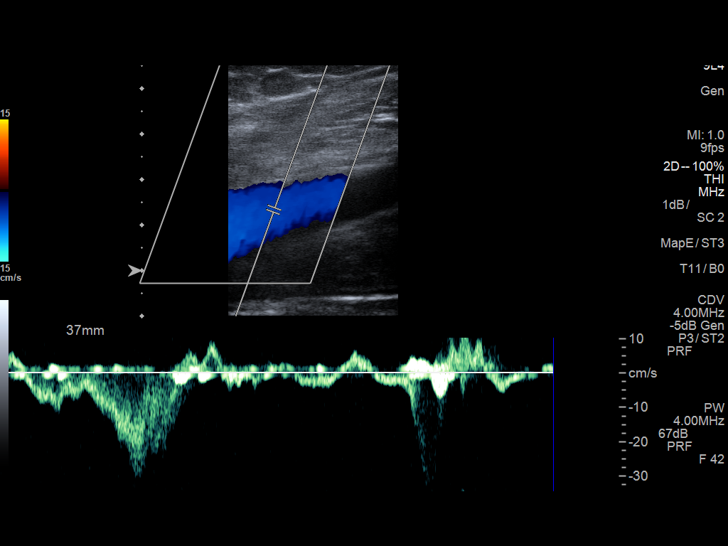
[im 7/38]
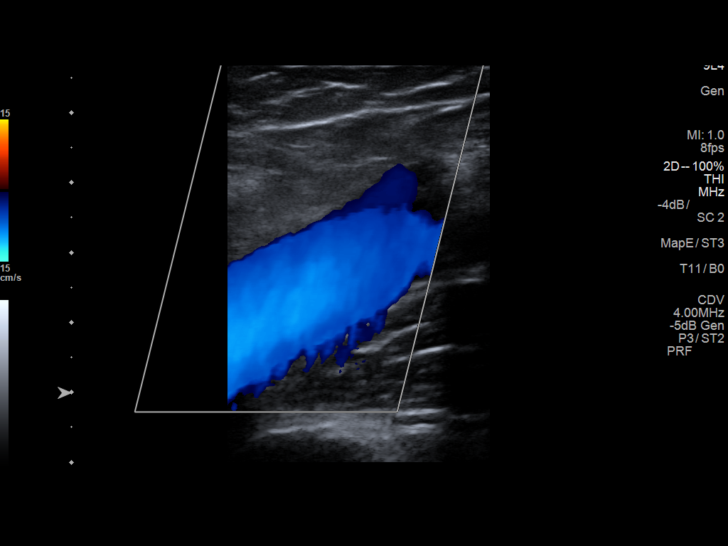
[im 10/38]
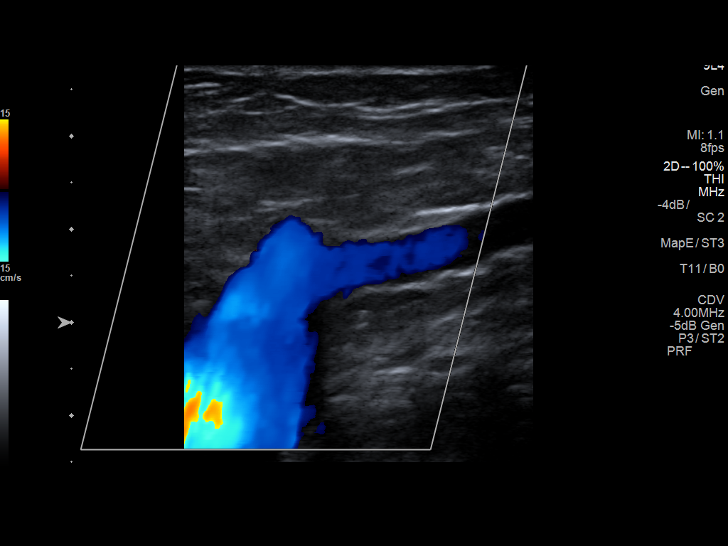
[im 13/38]
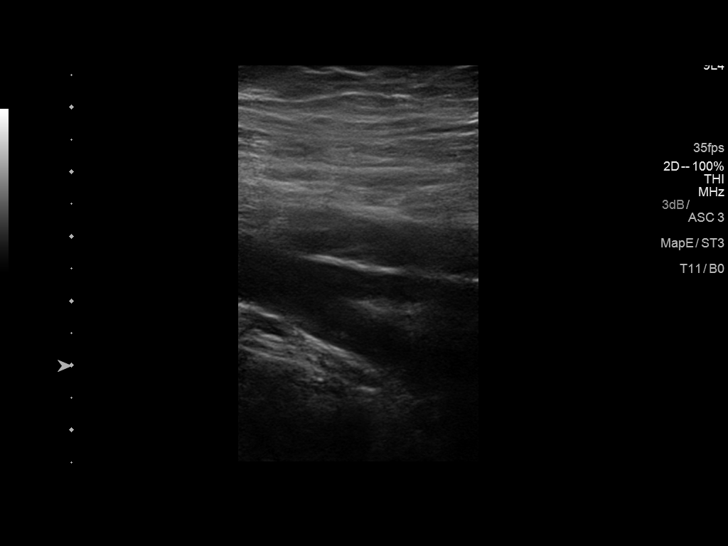
[im 17/38]
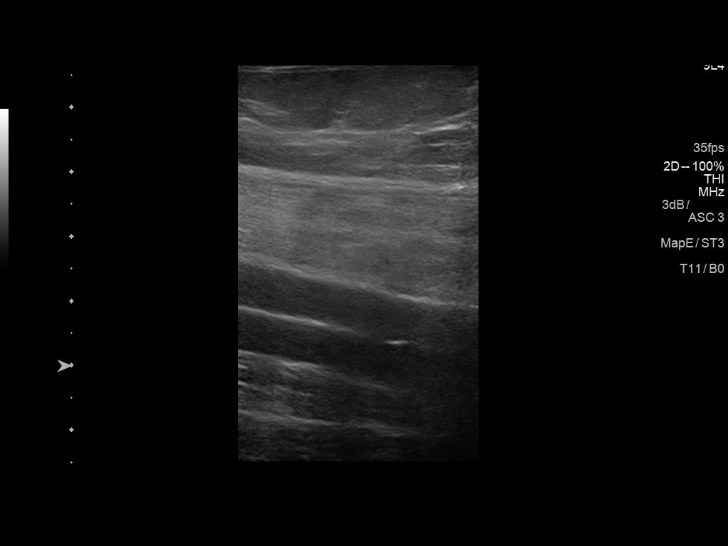
[im 20/38]
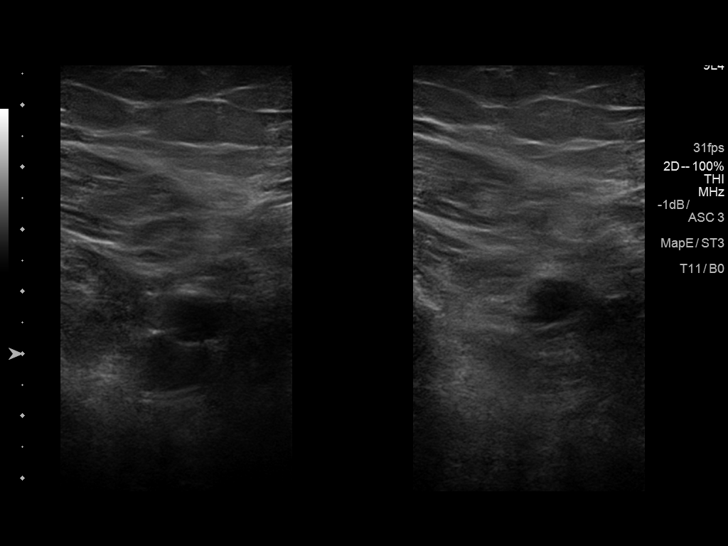
[im 21/38]
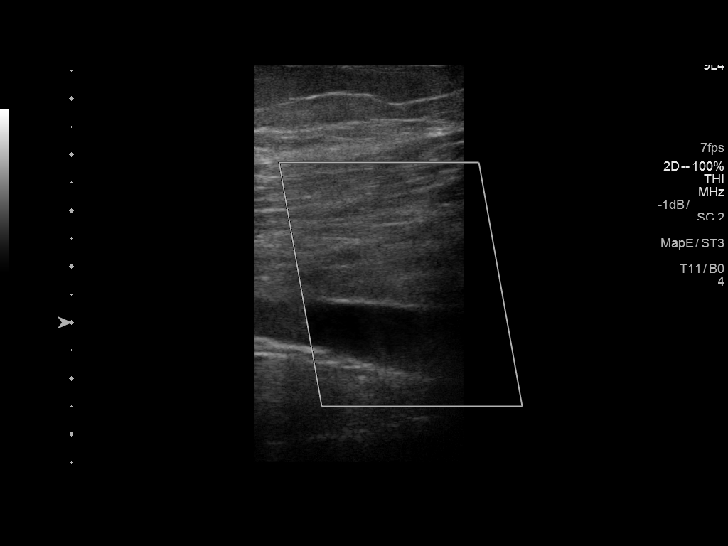
[im 25/38]
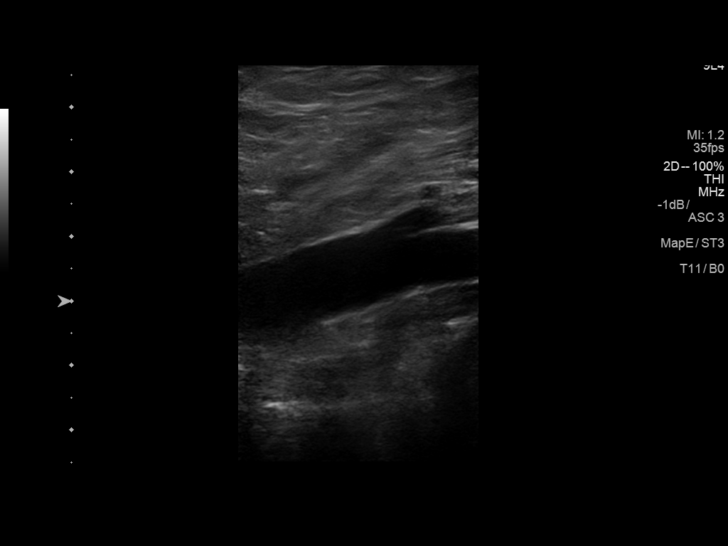
[im 28/38]
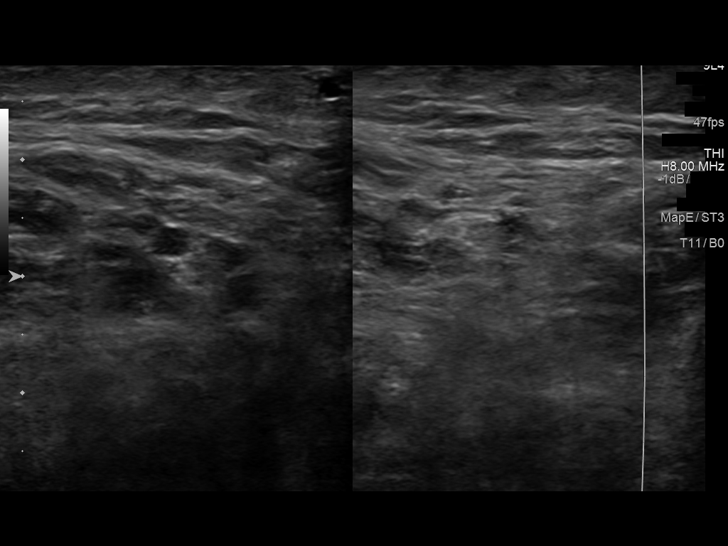
[im 31/38]
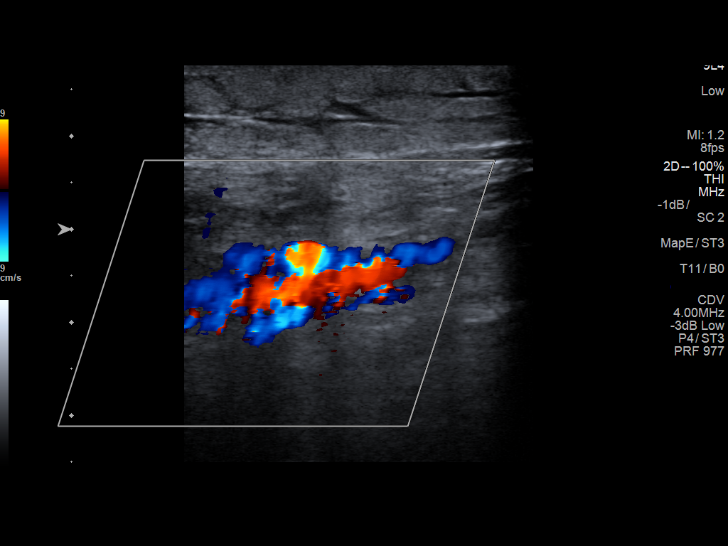
[im 34/38]
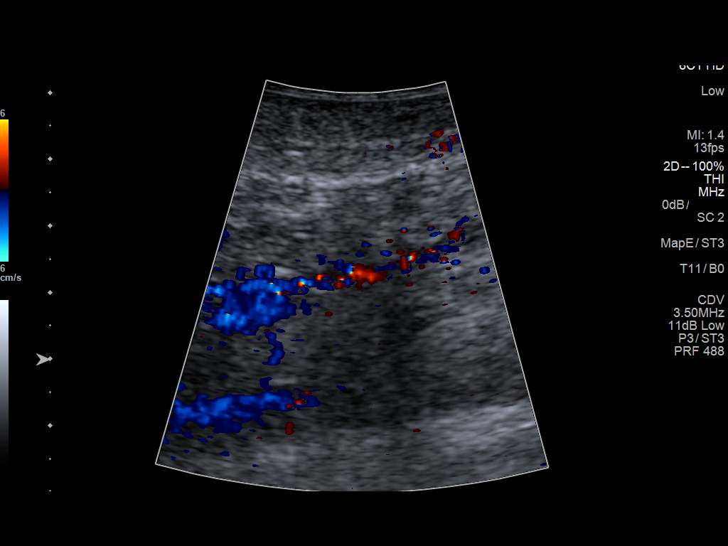
[im 38/38]
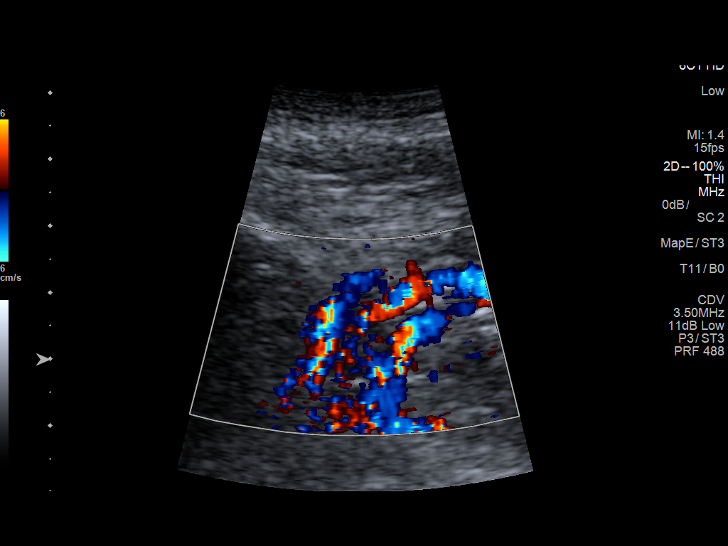

[13 of 24 positions shown; findings below may reference images not displayed]

FINDINGS: Contralateral Common Femoral Vein: Respiratory phasicity is normal
and symmetric with the symptomatic side. No evidence of thrombus.
Normal compressibility.

Common Femoral Vein: No evidence of thrombus. Normal
compressibility, respiratory phasicity and response to augmentation.

Saphenofemoral Junction: No evidence of thrombus. Normal
compressibility and flow on color Doppler imaging.

Profunda Femoral Vein: No evidence of thrombus. Normal
compressibility and flow on color Doppler imaging.

Femoral Vein: No evidence of thrombus. Normal compressibility,
respiratory phasicity and response to augmentation.

Popliteal Vein: No evidence of thrombus. Normal compressibility,
respiratory phasicity and response to augmentation.

Calf Veins: No evidence of thrombus. Normal compressibility and flow
on color Doppler imaging.

Superficial Great Saphenous Vein: No evidence of thrombus. Normal
compressibility.

Venous Reflux:  None.

Other Findings: There is a minimal amount of subcutaneous edema at
the level the right lower leg and calf.
IMPRESSION: No evidence of DVT within the right lower extremity.

## 2019-02-24 DIAGNOSIS — Z79899 Other long term (current) drug therapy: Secondary | ICD-10-CM | POA: Diagnosis not present

## 2019-02-24 DIAGNOSIS — Z13220 Encounter for screening for lipoid disorders: Secondary | ICD-10-CM | POA: Diagnosis not present

## 2019-02-25 LAB — HEPATIC FUNCTION PANEL
ALT: 18 IU/L (ref 0–44)
AST: 18 IU/L (ref 0–40)
Albumin: 4.5 g/dL (ref 3.8–4.8)
Alkaline Phosphatase: 78 IU/L (ref 39–117)
Bilirubin Total: 0.3 mg/dL (ref 0.0–1.2)
Bilirubin, Direct: 0.09 mg/dL (ref 0.00–0.40)
TOTAL PROTEIN: 7.1 g/dL (ref 6.0–8.5)

## 2019-02-25 LAB — LIPID PANEL
Chol/HDL Ratio: 4.8 ratio (ref 0.0–5.0)
Cholesterol, Total: 190 mg/dL (ref 100–199)
HDL: 40 mg/dL (ref >39)
LDL CALC: 123 mg/dL — AB (ref 0–99)
Triglycerides: 137 mg/dL (ref 0–149)
VLDL Cholesterol Cal: 27 mg/dL (ref 5–40)

## 2019-03-07 ENCOUNTER — Encounter: Payer: Self-pay | Admitting: Family Medicine

## 2019-04-12 DIAGNOSIS — N401 Enlarged prostate with lower urinary tract symptoms: Secondary | ICD-10-CM | POA: Diagnosis not present

## 2019-04-16 DIAGNOSIS — R972 Elevated prostate specific antigen [PSA]: Secondary | ICD-10-CM | POA: Diagnosis not present

## 2019-04-16 DIAGNOSIS — R351 Nocturia: Secondary | ICD-10-CM | POA: Diagnosis not present

## 2019-04-16 DIAGNOSIS — N401 Enlarged prostate with lower urinary tract symptoms: Secondary | ICD-10-CM | POA: Diagnosis not present

## 2019-06-18 ENCOUNTER — Other Ambulatory Visit: Payer: Self-pay | Admitting: Family Medicine

## 2019-06-21 ENCOUNTER — Ambulatory Visit: Payer: Medicare Other | Admitting: Family Medicine

## 2019-06-28 ENCOUNTER — Encounter: Payer: Self-pay | Admitting: Family Medicine

## 2019-06-28 ENCOUNTER — Other Ambulatory Visit: Payer: Self-pay

## 2019-06-28 ENCOUNTER — Ambulatory Visit (INDEPENDENT_AMBULATORY_CARE_PROVIDER_SITE_OTHER): Payer: Medicare Other | Admitting: Family Medicine

## 2019-06-28 DIAGNOSIS — M7989 Other specified soft tissue disorders: Secondary | ICD-10-CM | POA: Diagnosis not present

## 2019-06-28 DIAGNOSIS — E119 Type 2 diabetes mellitus without complications: Secondary | ICD-10-CM

## 2019-06-28 DIAGNOSIS — I10 Essential (primary) hypertension: Secondary | ICD-10-CM

## 2019-06-28 DIAGNOSIS — E7800 Pure hypercholesterolemia, unspecified: Secondary | ICD-10-CM | POA: Diagnosis not present

## 2019-06-28 MED ORDER — METFORMIN HCL 500 MG PO TABS: 500.0000 mg | ORAL_TABLET | Freq: Two times a day (BID) | ORAL | 1 refills | Status: DC

## 2019-06-28 MED ORDER — ATORVASTATIN CALCIUM 20 MG PO TABS: 20.0000 mg | ORAL_TABLET | Freq: Every day | ORAL | 1 refills | Status: DC

## 2019-06-28 MED ORDER — GLIPIZIDE 5 MG PO TABS: ORAL_TABLET | ORAL | 1 refills | Status: DC

## 2019-06-28 MED ORDER — SPIRONOLACTONE 25 MG PO TABS: 25.0000 mg | ORAL_TABLET | Freq: Every day | ORAL | 1 refills | Status: DC

## 2019-06-28 MED ORDER — FINASTERIDE 5 MG PO TABS: 5.0000 mg | ORAL_TABLET | Freq: Every day | ORAL | 1 refills | Status: DC

## 2019-06-28 MED ORDER — AMLODIPINE BESY-BENAZEPRIL HCL 10-40 MG PO CAPS: 1.0000 | ORAL_CAPSULE | Freq: Every day | ORAL | 1 refills | Status: DC

## 2019-06-28 MED ORDER — CHLORTHALIDONE 25 MG PO TABS: 25.0000 mg | ORAL_TABLET | Freq: Every day | ORAL | 1 refills | Status: DC

## 2019-06-28 NOTE — Addendum Note (Signed)
Addended by: Carmelina Noun on: 06/28/2019 02:03 PM   Modules accepted: Orders

## 2019-06-28 NOTE — Progress Notes (Signed)
   Subjective:    Patient ID: Jeremy Rivera, male    DOB: August 28, 1951, 68 y.o.   MRN: [id number] Audio only Diabetes He presents for his follow-up diabetic visit. He has type 2 diabetes mellitus. Current diabetic treatments: metformin, glipizide. He is compliant with treatment all of the time. He is following a generally healthy diet. He never participates in exercise. Home blood sugar record trend: around 109. Eye exam is not current (2 years ago).   BP taken last night was 143/101 but pt states machine has a small cuff.   Virtual Visit via Telephone Note  I connected with Jeremy Rivera on 06/28/19 at  9:30 AM EDT by telephone and verified that I am speaking with the correct person using two identifiers.  Location: Patient: home Provider: office   I discussed the limitations, risks, security and privacy concerns of performing an evaluation and management service by telephone and the availability of in person appointments. I also discussed with the patient that there may be a patient responsible charge related to this service. The patient expressed understanding and agreed to proceed.   History of Present Illness:    Observations/Objective:   Assessment and Plan:   Follow Up Instructions:    I discussed the assessment and treatment plan with the patient. The patient was provided an opportunity to ask questions and all were answered. The patient agreed with the plan and demonstrated an understanding of the instructions.   The patient was advised to call back or seek an in-person evaluation if the symptoms worsen or if the condition fails to improve as anticipated.  I provided 25 minutes of non-face-to-face time during this encounter.  Blood pressure medicine and blood pressure levels reviewed today with patient. Compliant with blood pressure medicine. States does not miss a dose. No obvious side effects. Blood pressure generally good when checked elsewhere. Watching salt  intake.   Patient claims compliance with diabetes medication. No obvious side effects. Reports no substantial low sugar spells. Most numbers are generally in good range when checked fasting. Generally does not miss a dose of medication. Watching diabetic diet closely  Patient continues to take lipid medication regularly. No obvious side effects from it. Generally does not miss a dose. Prior blood work results are reviewed with patient. Patient continues to work on fat intake in diet    Of note patient was on diuretics via the cardiologist.  He was on this through January.  Had to stop because he had not seen a cardiologist.  Blood pressure elevated at times.  Discussion held.  We will assume a diuretic prescription Aldactone and HydroDIURIL as prescribed by the cardiologist.   Review of Systems No headache, no major weight loss or weight gain, no chest pain no back pain abdominal pain no change in bowel habits complete ROS otherwise negative     Objective:   Physical Exam  Virtual      Assessment & Plan:  Impression type 2 diabetes.  Apparent good control glucose is good discussed to maintain same meds  2.  Hypertension suboptimal discussed will be reinitiating diuretics is definitely should help  3.  Hyperlipidemia.  Compliance discussed prior blood work discussed diet and exercise discussed  Medications refilled.  We will resume diuretics should help blood pressure symptom care discussed no blood work at this time follow-up in 6 months for wellness plus chronic

## 2019-07-10 ENCOUNTER — Other Ambulatory Visit: Payer: Self-pay | Admitting: Family Medicine

## 2019-09-06 DIAGNOSIS — Z23 Encounter for immunization: Secondary | ICD-10-CM | POA: Diagnosis not present

## 2019-11-04 DIAGNOSIS — R972 Elevated prostate specific antigen [PSA]: Secondary | ICD-10-CM | POA: Diagnosis not present

## 2019-11-30 DIAGNOSIS — N400 Enlarged prostate without lower urinary tract symptoms: Secondary | ICD-10-CM | POA: Diagnosis not present

## 2019-11-30 DIAGNOSIS — R972 Elevated prostate specific antigen [PSA]: Secondary | ICD-10-CM | POA: Diagnosis not present

## 2019-12-16 ENCOUNTER — Other Ambulatory Visit: Payer: Self-pay | Admitting: Family Medicine

## 2019-12-16 NOTE — Telephone Encounter (Signed)
Scheduled 12/31 virtual

## 2019-12-16 NOTE — Telephone Encounter (Signed)
Please schedule appt and then route back to nurses to send in one refill 

## 2019-12-30 ENCOUNTER — Other Ambulatory Visit: Payer: Self-pay

## 2019-12-30 ENCOUNTER — Ambulatory Visit (INDEPENDENT_AMBULATORY_CARE_PROVIDER_SITE_OTHER): Payer: Medicare Other | Admitting: Family Medicine

## 2019-12-30 DIAGNOSIS — E119 Type 2 diabetes mellitus without complications: Secondary | ICD-10-CM

## 2019-12-30 DIAGNOSIS — E7800 Pure hypercholesterolemia, unspecified: Secondary | ICD-10-CM | POA: Diagnosis not present

## 2019-12-30 DIAGNOSIS — I10 Essential (primary) hypertension: Secondary | ICD-10-CM

## 2019-12-30 MED ORDER — ATORVASTATIN CALCIUM 20 MG PO TABS: 20.0000 mg | ORAL_TABLET | Freq: Every day | ORAL | 1 refills | Status: DC

## 2019-12-30 MED ORDER — METFORMIN HCL 500 MG PO TABS: 500.0000 mg | ORAL_TABLET | Freq: Two times a day (BID) | ORAL | 1 refills | Status: DC

## 2019-12-30 MED ORDER — SPIRONOLACTONE 25 MG PO TABS: 25.0000 mg | ORAL_TABLET | Freq: Every day | ORAL | 1 refills | Status: DC

## 2019-12-30 MED ORDER — GLIPIZIDE 5 MG PO TABS: ORAL_TABLET | ORAL | 1 refills | Status: DC

## 2019-12-30 MED ORDER — CHLORTHALIDONE 25 MG PO TABS: 25.0000 mg | ORAL_TABLET | Freq: Every day | ORAL | 1 refills | Status: DC

## 2019-12-30 MED ORDER — AMLODIPINE BESY-BENAZEPRIL HCL 10-40 MG PO CAPS: 1.0000 | ORAL_CAPSULE | Freq: Every day | ORAL | 1 refills | Status: DC

## 2019-12-30 MED ORDER — FINASTERIDE 5 MG PO TABS: 5.0000 mg | ORAL_TABLET | Freq: Every day | ORAL | 1 refills | Status: DC

## 2019-12-30 NOTE — Progress Notes (Signed)
   Subjective:  Audio plus video  Patient ID: Jeremy Rivera, male    DOB: 01/16/1951, 68 y.o.   MRN: [id number]  Diabetes He presents for his follow-up diabetic visit. He has type 2 diabetes mellitus. There are no hypoglycemic associated symptoms. There are no diabetic associated symptoms. There are no hypoglycemic complications. There are no diabetic complications. Compliance with diabetes treatment: checks sugars 2-3 times a week. He sees a podiatrist.Eye exam is not current.   Virtual Visit via Telephone Note  I connected with Jeremy Rivera on 12/30/19 at  2:00 PM EST by telephone and verified that I am speaking with the correct person using two identifiers.  Location: Patient: home Provider: office   I discussed the limitations, risks, security and privacy concerns of performing an evaluation and management service by telephone and the availability of in person appointments. I also discussed with the patient that there may be a patient responsible charge related to this service. The patient expressed understanding and agreed to proceed.   History of Present Illness:    Observations/Objective:   Assessment and Plan:   Follow Up Instructions:    I discussed the assessment and treatment plan with the patient. The patient was provided an opportunity to ask questions and all were answered. The patient agreed with the plan and demonstrated an understanding of the instructions.   The patient was advised to call back or seek an in-person evaluation if the symptoms worsen or if the condition fails to improve as anticipated.  I provided 25 minutes of non-face-to-face time during this encounter.   Jeremy Males, LPN Patient claims compliance with diabetes medication. No obvious side effects. Reports no substantial low sugar spells. Most numbers are generally in good range when checked fasting. Generally does not miss a dose of medication. Watching diabetic diet closely  Glu mid  120s  Blood pressure medicine and blood pressure levels reviewed today with patient. Compliant with blood pressure medicine. States does not miss a dose. No obvious side effects. Blood pressure generally good when checked elsewhere. Watching salt intake.   Patient continues to take lipid medication regularly. No obvious side effects from it. Generally does not miss a dose. Prior blood work results are reviewed with patient. Patient continues to work on fat intake in diet    Review of Systems No headache, no major weight loss or weight gain, no chest pain no back pain abdominal pain no change in bowel habits complete ROS otherwise negative     Objective:   Physical Exam Virtual       Assessment & Plan:  Impression #1 type 2 diabetes.  Apparent good control discussed to maintain same meds.  Prior blood work good.  Current glucose is good  2.  Hypertension.  Good control.  Discussed.  Maintain same meds diet exercise discussed  3.  Hyperlipidemia.  Last blood work good control.  Maintain same  4.COVID-19 general questions answered  Medications refilled.  Follow-up in the spring for wellness plus chronic plus medication refill.

## 2020-01-02 ENCOUNTER — Encounter: Payer: Self-pay | Admitting: Family Medicine

## 2020-01-17 DIAGNOSIS — R972 Elevated prostate specific antigen [PSA]: Secondary | ICD-10-CM | POA: Diagnosis not present

## 2020-02-03 ENCOUNTER — Encounter: Payer: Self-pay | Admitting: Family Medicine

## 2020-02-03 ENCOUNTER — Ambulatory Visit: Payer: Medicare Other | Attending: Internal Medicine

## 2020-02-03 DIAGNOSIS — Z23 Encounter for immunization: Secondary | ICD-10-CM

## 2020-02-03 NOTE — Progress Notes (Signed)
   Covid-19 Vaccination Clinic  Name:  LEEROY LOVINGS    MRN: [id number] DOB: 03/16/1951  02/03/2020  Mr. Olazabal was observed post Covid-19 immunization for 15 minutes without incidence. He was provided with Vaccine Information Sheet and instruction to access the V-Safe system.   Mr. Qu was instructed to call 911 with any severe reactions post vaccine: Marland Kitchen Difficulty breathing  . Swelling of your face and throat  . A fast heartbeat  . A bad rash all over your body  . Dizziness and weakness    Immunizations Administered    Name Date Dose VIS Date Route   Moderna COVID-19 Vaccine 02/03/2020  1:00 PM 0.5 mL 11/30/2019 Intramuscular   Manufacturer: Moderna   Lot: [id number]   NDC: 73225-672-09

## 2020-03-06 ENCOUNTER — Ambulatory Visit: Payer: Medicare Other | Attending: Internal Medicine

## 2020-03-06 DIAGNOSIS — Z23 Encounter for immunization: Secondary | ICD-10-CM | POA: Insufficient documentation

## 2020-03-06 NOTE — Progress Notes (Signed)
   Covid-19 Vaccination Clinic  Name:  SHAHEEM PICHON    MRN: [id number] DOB: Dec 03, 1951  03/06/2020  Mr. Maguire was observed post Covid-19 immunization for 15 minutes without incident. He was provided with Vaccine Information Sheet and instruction to access the V-Safe system.   Mr. Ohm was instructed to call 911 with any severe reactions post vaccine: Marland Kitchen Difficulty breathing  . Swelling of face and throat  . A fast heartbeat  . A bad rash all over body  . Dizziness and weakness   Immunizations Administered    Name Date Dose VIS Date Route   Moderna COVID-19 Vaccine 03/06/2020 12:39 PM 0.5 mL 11/30/2019 Intramuscular   Manufacturer: Moderna   Lot: [id number]   NDC: 36468-032-12

## 2020-04-25 DIAGNOSIS — R972 Elevated prostate specific antigen [PSA]: Secondary | ICD-10-CM | POA: Diagnosis not present

## 2020-05-01 ENCOUNTER — Telehealth: Payer: Self-pay | Admitting: Family Medicine

## 2020-05-01 DIAGNOSIS — I10 Essential (primary) hypertension: Secondary | ICD-10-CM

## 2020-05-01 DIAGNOSIS — E119 Type 2 diabetes mellitus without complications: Secondary | ICD-10-CM

## 2020-05-01 DIAGNOSIS — E7800 Pure hypercholesterolemia, unspecified: Secondary | ICD-10-CM

## 2020-05-01 DIAGNOSIS — Z79899 Other long term (current) drug therapy: Secondary | ICD-10-CM

## 2020-05-01 NOTE — Telephone Encounter (Signed)
Lip, liv, met 7 and A1c?

## 2020-05-01 NOTE — Telephone Encounter (Signed)
Lab orders placed and pt is aware. Front will change appt type to physical/chronic. Pt verbalized understanding.

## 2020-05-01 NOTE — Telephone Encounter (Signed)
Yes, also lets make sure that visit is chronic plus wellness

## 2020-05-01 NOTE — Telephone Encounter (Signed)
Pt has follow up on 5/14 and would like to know if Dr. Brett Canales would like lab work done before appt.

## 2020-05-02 DIAGNOSIS — N400 Enlarged prostate without lower urinary tract symptoms: Secondary | ICD-10-CM | POA: Diagnosis not present

## 2020-05-02 DIAGNOSIS — R972 Elevated prostate specific antigen [PSA]: Secondary | ICD-10-CM | POA: Diagnosis not present

## 2020-05-09 DIAGNOSIS — Z79899 Other long term (current) drug therapy: Secondary | ICD-10-CM | POA: Diagnosis not present

## 2020-05-09 DIAGNOSIS — E7800 Pure hypercholesterolemia, unspecified: Secondary | ICD-10-CM | POA: Diagnosis not present

## 2020-05-09 DIAGNOSIS — E119 Type 2 diabetes mellitus without complications: Secondary | ICD-10-CM | POA: Diagnosis not present

## 2020-05-09 DIAGNOSIS — I10 Essential (primary) hypertension: Secondary | ICD-10-CM | POA: Diagnosis not present

## 2020-05-10 LAB — BASIC METABOLIC PANEL
BUN/Creatinine Ratio: 16 (ref 10–24)
BUN: 23 mg/dL (ref 8–27)
CO2: 24 mmol/L (ref 20–29)
Calcium: 10 mg/dL (ref 8.6–10.2)
Chloride: 96 mmol/L (ref 96–106)
Creatinine, Ser: 1.48 mg/dL — ABNORMAL HIGH (ref 0.76–1.27)
GFR calc Af Amer: 55 mL/min/{1.73_m2} — ABNORMAL LOW (ref >59)
GFR calc non Af Amer: 47 mL/min/{1.73_m2} — ABNORMAL LOW (ref >59)
Glucose: 139 mg/dL — ABNORMAL HIGH (ref 65–99)
Potassium: 4.8 mmol/L (ref 3.5–5.2)
Sodium: 136 mmol/L (ref 134–144)

## 2020-05-10 LAB — LIPID PANEL
Chol/HDL Ratio: 4.8 ratio (ref 0.0–5.0)
Cholesterol, Total: 186 mg/dL (ref 100–199)
HDL: 39 mg/dL — ABNORMAL LOW (ref >39)
LDL Chol Calc (NIH): 122 mg/dL — ABNORMAL HIGH (ref 0–99)
Triglycerides: 140 mg/dL (ref 0–149)
VLDL Cholesterol Cal: 25 mg/dL (ref 5–40)

## 2020-05-10 LAB — HEPATIC FUNCTION PANEL
ALT: 14 IU/L (ref 0–44)
AST: 20 IU/L (ref 0–40)
Albumin: 4.8 g/dL (ref 3.8–4.8)
Alkaline Phosphatase: 62 IU/L (ref 39–117)
Bilirubin Total: 0.4 mg/dL (ref 0.0–1.2)
Bilirubin, Direct: 0.11 mg/dL (ref 0.00–0.40)
Total Protein: 7.2 g/dL (ref 6.0–8.5)

## 2020-05-10 LAB — HEMOGLOBIN A1C
Est. average glucose Bld gHb Est-mCnc: 154 mg/dL
Hgb A1c MFr Bld: 7 % — ABNORMAL HIGH (ref 4.8–5.6)

## 2020-05-12 ENCOUNTER — Encounter: Payer: Self-pay | Admitting: Family Medicine

## 2020-05-12 ENCOUNTER — Ambulatory Visit (INDEPENDENT_AMBULATORY_CARE_PROVIDER_SITE_OTHER): Payer: Medicare Other | Admitting: Family Medicine

## 2020-05-12 ENCOUNTER — Other Ambulatory Visit: Payer: Self-pay

## 2020-05-12 VITALS — BP 138/90 | Temp 98.4°F | Ht 69.0 in | Wt 251.4 lb

## 2020-05-12 DIAGNOSIS — Z0000 Encounter for general adult medical examination without abnormal findings: Secondary | ICD-10-CM

## 2020-05-12 DIAGNOSIS — E119 Type 2 diabetes mellitus without complications: Secondary | ICD-10-CM

## 2020-05-12 DIAGNOSIS — E7800 Pure hypercholesterolemia, unspecified: Secondary | ICD-10-CM

## 2020-05-12 DIAGNOSIS — I10 Essential (primary) hypertension: Secondary | ICD-10-CM

## 2020-05-12 DIAGNOSIS — N400 Enlarged prostate without lower urinary tract symptoms: Secondary | ICD-10-CM

## 2020-05-12 MED ORDER — AMLODIPINE BESY-BENAZEPRIL HCL 10-40 MG PO CAPS: 1.0000 | ORAL_CAPSULE | Freq: Every day | ORAL | 1 refills | Status: DC

## 2020-05-12 MED ORDER — ATORVASTATIN CALCIUM 20 MG PO TABS: 20.0000 mg | ORAL_TABLET | Freq: Every day | ORAL | 1 refills | Status: DC

## 2020-05-12 MED ORDER — METFORMIN HCL 500 MG PO TABS: 500.0000 mg | ORAL_TABLET | Freq: Two times a day (BID) | ORAL | 1 refills | Status: DC

## 2020-05-12 MED ORDER — SPIRONOLACTONE 25 MG PO TABS: 25.0000 mg | ORAL_TABLET | Freq: Every day | ORAL | 1 refills | Status: DC

## 2020-05-12 MED ORDER — GLIPIZIDE 5 MG PO TABS: ORAL_TABLET | ORAL | 1 refills | Status: DC

## 2020-05-12 MED ORDER — FINASTERIDE 5 MG PO TABS: 5.0000 mg | ORAL_TABLET | Freq: Every day | ORAL | 1 refills | Status: DC

## 2020-05-12 MED ORDER — CHLORTHALIDONE 25 MG PO TABS: 25.0000 mg | ORAL_TABLET | Freq: Every day | ORAL | 1 refills | Status: DC

## 2020-05-12 NOTE — Progress Notes (Signed)
Subjective:    Patient ID: Jeremy Rivera, male    DOB: 08-22-1951, 69 y.o.   MRN: [id number]  Diabetes He presents for his follow-up diabetic visit. He has type 2 diabetes mellitus. There are no hypoglycemic associated symptoms. There are no diabetic associated symptoms. There are no hypoglycemic complications. There are no diabetic complications. Risk factors for coronary artery disease include male sex and hypertension. He rarely participates in exercise. Home blood sugar record trend: checks sugars about three times a week. He does not see a podiatrist.Eye exam is current.   AWV- Annual Wellness Visit  The patient was seen for their annual wellness visit. The patient's past medical history, surgical history, and family history were reviewed. Pertinent vaccines were reviewed ( tetanus, pneumonia, shingles, flu) The patient's medication list was reviewed and updated.  The height and weight were entered.  BMI recorded in electronic record elsewhere  Cognitive screening was completed. Outcome of Mini - Cog: pass   Falls /depression screening electronically recorded within record elsewhere  Current tobacco usage: none (All patients who use tobacco were given written and verbal information on quitting)  Recent listing of emergency department/hospitalizations over the past year were reviewed.  current specialist the patient sees on a regular basis: [facility] once a year  Medicare annual wellness visit patient questionnaire was reviewed.  A written screening schedule for the patient for the next 5-10 years was given. Appropriate discussion of followup regarding next visit was discussed.   Results for orders placed or performed in visit on 05/01/20  Lipid Profile  Result Value Ref Range   Cholesterol, Total 186 100 - 199 mg/dL   Triglycerides 732 0 - 149 mg/dL   HDL 39 (L) >20 mg/dL   VLDL Cholesterol Cal 25 5 - 40 mg/dL   LDL Chol Calc (NIH) 254 (H) 0 - 99 mg/dL   Chol/HDL Ratio 4.8 0.0 - 5.0 ratio  Hepatic function panel  Result Value Ref Range   Total Protein 7.2 6.0 - 8.5 g/dL   Albumin 4.8 3.8 - 4.8 g/dL   Bilirubin Total 0.4 0.0 - 1.2 mg/dL   Bilirubin, Direct 2.70 0.00 - 0.40 mg/dL   Alkaline Phosphatase 62 39 - 117 IU/L   AST 20 0 - 40 IU/L   ALT 14 0 - 44 IU/L  Basic Metabolic Panel (BMET)  Result Value Ref Range   Glucose 139 (H) 65 - 99 mg/dL   BUN 23 8 - 27 mg/dL   Creatinine, Ser 6.23 (H) 0.76 - 1.27 mg/dL   GFR calc non Af Amer 47 (L) >59 mL/min/1.73   GFR calc Af Amer 55 (L) >59 mL/min/1.73   BUN/Creatinine Ratio 16 10 - 24   Sodium 136 134 - 144 mmol/L   Potassium 4.8 3.5 - 5.2 mmol/L   Chloride 96 96 - 106 mmol/L   CO2 24 20 - 29 mmol/L   Calcium 10.0 8.6 - 10.2 mg/dL  Hemoglobin J6E  Result Value Ref Range   Hgb A1c MFr Bld 7.0 (H) 4.8 - 5.6 %   Est. average glucose Bld gHb Est-mCnc 154 mg/dL    Patient claims compliance with diabetes medication. No obvious side effects. Reports no substantial low sugar spells. Most numbers are generally in good range when checked fasting. Generally does not miss a dose of medication. Watching diabetic diet closely   Review of Systems No headache, no major weight loss or weight gain, no chest pain no back pain abdominal pain no change in  bowel habits complete ROS otherwise negative     Objective:   Physical Exam Vitals reviewed.  Constitutional:      Appearance: He is well-developed.  HENT:     Head: Normocephalic and atraumatic.     Right Ear: External ear normal.     Left Ear: External ear normal.     Nose: Nose normal.  Eyes:     Pupils: Pupils are equal, round, and reactive to light.  Neck:     Thyroid: No thyromegaly.  Cardiovascular:     Rate and Rhythm: Normal rate and regular rhythm.     Heart sounds: Normal heart sounds. No murmur.  Pulmonary:     Effort: Pulmonary effort is normal. No respiratory distress.     Breath sounds: Normal breath sounds. No wheezing.    Abdominal:     General: Bowel sounds are normal. There is no distension.     Palpations: Abdomen is soft. There is no mass.     Tenderness: There is no abdominal tenderness.  Genitourinary:    Penis: Normal.   Musculoskeletal:        General: Normal range of motion.     Cervical back: Normal range of motion and neck supple.  Lymphadenopathy:     Cervical: No cervical adenopathy.  Skin:    General: Skin is warm and dry.     Findings: No erythema.  Neurological:     Mental Status: He is alert.     Motor: No abnormal muscle tone.  Psychiatric:        Behavior: Behavior normal.        Judgment: Judgment normal.   Prostate exam not performed followed by specialist  Feet bilateral good pulses.  Sensory exam intact        Assessment & Plan:  Impression 1 wellness exam.  Up-to-date on colonoscopy.  Diet discussed.  Exercise discussed vaccines discussed.  Has already had Covid vaccine  2.  Type 2 diabetes.  Control good discussed maintain same meds  3.  Elevated PSA followed by specialist  4.  Hyperlipidemia.  Control suboptimal.  Discussed would like for the LDL to be improved.  Patients can work harder on his diet and see if we can get it down without having to increase his Lipitor  Follow-up in 6 months.  Meds refilled diet exercise discussed and encouraged

## 2020-05-27 ENCOUNTER — Other Ambulatory Visit: Payer: Self-pay | Admitting: Family Medicine

## 2020-05-30 NOTE — Telephone Encounter (Signed)
Ask what is he using it for? Since I didn't prescribe.  Thx, dr. Ladona Ridgel

## 2020-09-12 DIAGNOSIS — Z23 Encounter for immunization: Secondary | ICD-10-CM | POA: Diagnosis not present

## 2020-09-18 ENCOUNTER — Other Ambulatory Visit: Payer: Self-pay | Admitting: *Deleted

## 2020-09-18 MED ORDER — CHLORTHALIDONE 25 MG PO TABS: 25.0000 mg | ORAL_TABLET | Freq: Every day | ORAL | 0 refills | Status: DC

## 2020-09-19 ENCOUNTER — Telehealth: Payer: Self-pay | Admitting: Family Medicine

## 2020-09-19 DIAGNOSIS — I10 Essential (primary) hypertension: Secondary | ICD-10-CM

## 2020-09-19 NOTE — Addendum Note (Signed)
Addended by: Marlowe Shores on: 09/19/2020 03:24 PM   Modules accepted: Orders

## 2020-09-19 NOTE — Telephone Encounter (Signed)
Pt contacted. Pt states he is able to go possibly tomorrow to have lab work done. Please advise. Thank you

## 2020-09-19 NOTE — Telephone Encounter (Signed)
Fax from [facility] stating that pt is paying $87 for Chlorthalidone and is requesting lower cost alternative. HCTZ 50 mg is $0 and Indapamide 2.5 mg is $15 for 90 day supply. Pt last seen 05/12/20 for wellness. Please advise. Thank you

## 2020-09-19 NOTE — Telephone Encounter (Signed)
Would recommend labs to recheck kidney funtion before changing the bp medication.  When can he go, this week?  Thx.   Dr. Ladona Ridgel

## 2020-09-19 NOTE — Telephone Encounter (Signed)
Lab orders placed and pt is aware. Pt verbalized understanding ?

## 2020-09-19 NOTE — Telephone Encounter (Signed)
Ok, pls order cbc, cmp. And have pt do it fasting, but to drink water prior to the labs.   Then I can more accurately see if we can change to one of the other bp medications he mentioned.   Thx.   Dr. Ladona Ridgel

## 2020-09-20 DIAGNOSIS — I10 Essential (primary) hypertension: Secondary | ICD-10-CM | POA: Diagnosis not present

## 2020-09-21 LAB — CBC WITH DIFFERENTIAL/PLATELET
Basophils Absolute: 0 10*3/uL (ref 0.0–0.2)
Basos: 1 %
EOS (ABSOLUTE): 0.2 10*3/uL (ref 0.0–0.4)
Eos: 2 %
Hematocrit: 42.1 % (ref 37.5–51.0)
Hemoglobin: 13.9 g/dL (ref 13.0–17.7)
Immature Grans (Abs): 0 10*3/uL (ref 0.0–0.1)
Immature Granulocytes: 0 %
Lymphocytes Absolute: 3.9 10*3/uL — ABNORMAL HIGH (ref 0.7–3.1)
Lymphs: 48 %
MCH: 28.7 pg (ref 26.6–33.0)
MCHC: 33 g/dL (ref 31.5–35.7)
MCV: 87 fL (ref 79–97)
Monocytes Absolute: 0.4 10*3/uL (ref 0.1–0.9)
Monocytes: 5 %
Neutrophils Absolute: 3.6 10*3/uL (ref 1.4–7.0)
Neutrophils: 44 %
Platelets: 223 10*3/uL (ref 150–450)
RBC: 4.84 x10E6/uL (ref 4.14–5.80)
RDW: 13.4 % (ref 11.6–15.4)
WBC: 8.2 10*3/uL (ref 3.4–10.8)

## 2020-09-21 LAB — COMPREHENSIVE METABOLIC PANEL
ALT: 12 IU/L (ref 0–44)
AST: 13 IU/L (ref 0–40)
Albumin/Globulin Ratio: 1.9 (ref 1.2–2.2)
Albumin: 4.8 g/dL — ABNORMAL HIGH (ref 3.7–4.7)
Alkaline Phosphatase: 69 IU/L (ref 44–121)
BUN/Creatinine Ratio: 16 (ref 10–24)
BUN: 21 mg/dL (ref 8–27)
Bilirubin Total: 0.3 mg/dL (ref 0.0–1.2)
CO2: 25 mmol/L (ref 20–29)
Calcium: 9.4 mg/dL (ref 8.6–10.2)
Chloride: 97 mmol/L (ref 96–106)
Creatinine, Ser: 1.31 mg/dL — ABNORMAL HIGH (ref 0.76–1.27)
GFR calc Af Amer: 63 mL/min/{1.73_m2} (ref >59)
GFR calc non Af Amer: 54 mL/min/{1.73_m2} — ABNORMAL LOW (ref >59)
Globulin, Total: 2.5 g/dL (ref 1.5–4.5)
Glucose: 111 mg/dL — ABNORMAL HIGH (ref 65–99)
Potassium: 4.9 mmol/L (ref 3.5–5.2)
Sodium: 136 mmol/L (ref 134–144)
Total Protein: 7.3 g/dL (ref 6.0–8.5)

## 2020-10-05 ENCOUNTER — Other Ambulatory Visit: Payer: Self-pay | Admitting: Family Medicine

## 2020-10-05 NOTE — Telephone Encounter (Signed)
Has not seen dr Ladona Ridgel. Please schedule visit and then route back to nurses

## 2020-10-06 NOTE — Telephone Encounter (Signed)
Scheduled 10/19

## 2020-10-17 ENCOUNTER — Ambulatory Visit (INDEPENDENT_AMBULATORY_CARE_PROVIDER_SITE_OTHER): Payer: Medicare Other | Admitting: Family Medicine

## 2020-10-17 ENCOUNTER — Encounter: Payer: Self-pay | Admitting: Family Medicine

## 2020-10-17 ENCOUNTER — Other Ambulatory Visit: Payer: Self-pay

## 2020-10-17 VITALS — BP 124/76 | HR 86 | Temp 97.4°F | Ht 69.0 in | Wt 250.2 lb

## 2020-10-17 DIAGNOSIS — R972 Elevated prostate specific antigen [PSA]: Secondary | ICD-10-CM | POA: Diagnosis not present

## 2020-10-17 DIAGNOSIS — I10 Essential (primary) hypertension: Secondary | ICD-10-CM

## 2020-10-17 DIAGNOSIS — I110 Hypertensive heart disease with heart failure: Secondary | ICD-10-CM

## 2020-10-17 DIAGNOSIS — I43 Cardiomyopathy in diseases classified elsewhere: Secondary | ICD-10-CM | POA: Diagnosis not present

## 2020-10-17 DIAGNOSIS — I5042 Chronic combined systolic (congestive) and diastolic (congestive) heart failure: Secondary | ICD-10-CM

## 2020-10-17 DIAGNOSIS — E782 Mixed hyperlipidemia: Secondary | ICD-10-CM | POA: Diagnosis not present

## 2020-10-17 DIAGNOSIS — E119 Type 2 diabetes mellitus without complications: Secondary | ICD-10-CM

## 2020-10-17 LAB — POCT GLYCOSYLATED HEMOGLOBIN (HGB A1C): Hemoglobin A1C: 6.4 % — AB (ref 4.0–5.6)

## 2020-10-17 MED ORDER — GLIPIZIDE 5 MG PO TABS: ORAL_TABLET | ORAL | 1 refills | Status: DC

## 2020-10-17 MED ORDER — SPIRONOLACTONE 25 MG PO TABS: 25.0000 mg | ORAL_TABLET | Freq: Every day | ORAL | 1 refills | Status: DC

## 2020-10-17 MED ORDER — ATORVASTATIN CALCIUM 20 MG PO TABS: 20.0000 mg | ORAL_TABLET | Freq: Every day | ORAL | 1 refills | Status: DC

## 2020-10-17 MED ORDER — METFORMIN HCL 500 MG PO TABS: 500.0000 mg | ORAL_TABLET | Freq: Two times a day (BID) | ORAL | 1 refills | Status: DC

## 2020-10-17 MED ORDER — AMLODIPINE BESY-BENAZEPRIL HCL 10-40 MG PO CAPS: 1.0000 | ORAL_CAPSULE | Freq: Every day | ORAL | 1 refills | Status: DC

## 2020-10-17 MED ORDER — CHLORTHALIDONE 25 MG PO TABS: 25.0000 mg | ORAL_TABLET | Freq: Every day | ORAL | 1 refills | Status: DC

## 2020-10-17 NOTE — Progress Notes (Signed)
Patient ID: Jeremy Rivera, male    DOB: [birth year], 69 y.o.   MRN: [id number]   Chief Complaint  Patient presents with  . Discussion  . Hypertension  . Diabetes   Subjective:    HPI  CC- review labs and f/u on htn, DM2. Has h/o CHF, cardiomyopathy, HLD, DM2, and HTN.  Dm2- Compliant with medications. Checking blood glucose.   Not seeing any high or low numbers.  Denies polyuria or polydipsia.  Eye exam: due this year.  Last one in 2018.  Pt to schedule. Foot exam: no foot concerns or neuropathy.  DM2- BG getting 119-135 range.  Cr slightly elevated 1.48 and now at 1.31.  Seeing urology psa, had elevated in past. And taking finasteride.  Had biopsy in past. Normal in past, per pt.  Seeing eye doctor - no retinopathy per pt.  Didn't see last year.  HLD- doing well no new concerns.  Compliant with meds. No chest pain, palpitations, myalgias or joint pains.  HTN Pt compliant with BP meds.  No SEs Denies chest pain, sob, LE swelling, or blurry vision.   Medical History Jeremy Rivera has a past medical history of Diabetes mellitus without complication (HCC), Diastolic dysfunction, ED (erectile dysfunction), Herniated lumbar intervertebral disc, Hypertension, Irregular heart beat, and Prostate hypertrophy.   Outpatient Encounter Medications as of 10/17/2020  Medication Sig  . amLODipine-benazepril (LOTREL) 10-40 MG capsule Take 1 capsule by mouth daily.  Marland Kitchen aspirin EC 81 MG tablet Take 81 mg by mouth daily.  Marland Kitchen atorvastatin (LIPITOR) 20 MG tablet Take 1 tablet (20 mg total) by mouth at bedtime.  . chlorthalidone (HYGROTON) 25 MG tablet Take 1 tablet (25 mg total) by mouth daily.  . finasteride (PROSCAR) 5 MG tablet Take 1 tablet (5 mg total) by mouth daily.  Marland Kitchen glipiZIDE (GLUCOTROL) 5 MG tablet Take two tablets in the morning and two tablets in the evening  . glucose blood (ONE TOUCH ULTRA TEST) test strip TEST BLOOD SUGAR ONCE DAILY AS DIRECTED **DX E11.9  . metFORMIN  (GLUCOPHAGE) 500 MG tablet Take 1 tablet (500 mg total) by mouth 2 (two) times daily with a meal.  . spironolactone (ALDACTONE) 25 MG tablet Take 1 tablet (25 mg total) by mouth daily.  Marland Kitchen triamcinolone cream (KENALOG) 0.1 % APPLY THIN LAYER TWICE DAILY TO RASH  . [DISCONTINUED] amLODipine-benazepril (LOTREL) 10-40 MG capsule Take 1 capsule by mouth daily.  . [DISCONTINUED] atorvastatin (LIPITOR) 20 MG tablet Take 1 tablet (20 mg total) by mouth at bedtime.  . [DISCONTINUED] chlorthalidone (HYGROTON) 25 MG tablet Take 1 tablet (25 mg total) by mouth daily.  . [DISCONTINUED] glipiZIDE (GLUCOTROL) 5 MG tablet Take two tablets in the morning and two tablets in the evening  . [DISCONTINUED] metFORMIN (GLUCOPHAGE) 500 MG tablet Take 1 tablet (500 mg total) by mouth 2 (two) times daily with a meal.  . [DISCONTINUED] spironolactone (ALDACTONE) 25 MG tablet Take 1 tablet (25 mg total) by mouth daily.   No facility-administered encounter medications on file as of 10/17/2020.     Review of Systems  Constitutional: Negative for chills and fever.  HENT: Negative for congestion, rhinorrhea and sore throat.   Respiratory: Negative for cough, shortness of breath and wheezing.   Cardiovascular: Negative for chest pain and leg swelling.  Gastrointestinal: Negative for abdominal pain, diarrhea, nausea and vomiting.  Genitourinary: Negative for dysuria and frequency.  Skin: Negative for rash.  Neurological: Negative for dizziness, weakness and headaches.  Vitals BP 124/76   Pulse 86   Temp (!) 97.4 F (36.3 C)   Ht 5\' 9"  (1.753 m)   Wt 250 lb 3.2 oz (113.5 kg)   SpO2 98%   BMI 36.95 kg/m   Objective:   Physical Exam Vitals and nursing note reviewed.  Constitutional:      General: He is not in acute distress.    Appearance: Normal appearance. He is not ill-appearing.  HENT:     Head: Normocephalic.     Nose: Nose normal. No congestion.     Mouth/Throat:     Mouth: Mucous membranes are  moist.     Pharynx: No oropharyngeal exudate.  Eyes:     Extraocular Movements: Extraocular movements intact.     Conjunctiva/sclera: Conjunctivae normal.     Pupils: Pupils are equal, round, and reactive to light.  Cardiovascular:     Rate and Rhythm: Normal rate and regular rhythm.     Pulses: Normal pulses.     Heart sounds: Normal heart sounds. No murmur heard.   Pulmonary:     Effort: Pulmonary effort is normal.     Breath sounds: Normal breath sounds. No wheezing, rhonchi or rales.  Musculoskeletal:        General: Normal range of motion.     Right lower leg: No edema.     Left lower leg: No edema.  Skin:    General: Skin is warm and dry.     Findings: No rash.  Neurological:     General: No focal deficit present.     Mental Status: He is alert and oriented to person, place, and time.     Cranial Nerves: No cranial nerve deficit.  Psychiatric:        Mood and Affect: Mood normal.        Behavior: Behavior normal.        Thought Content: Thought content normal.        Judgment: Judgment normal.      Assessment and Plan   1. Diabetes mellitus without complication (HCC) - POCT HgB A1C - glipiZIDE (GLUCOTROL) 5 MG tablet; Take two tablets in the morning and two tablets in the evening  Dispense: 360 tablet; Refill: 1 - metFORMIN (GLUCOPHAGE) 500 MG tablet; Take 1 tablet (500 mg total) by mouth 2 (two) times daily with a meal.  Dispense: 180 tablet; Refill: 1  2. Essential hypertension, benign - amLODipine-benazepril (LOTREL) 10-40 MG capsule; Take 1 capsule by mouth daily.  Dispense: 90 capsule; Refill: 1 - chlorthalidone (HYGROTON) 25 MG tablet; Take 1 tablet (25 mg total) by mouth daily.  Dispense: 90 tablet; Refill: 1 - spironolactone (ALDACTONE) 25 MG tablet; Take 1 tablet (25 mg total) by mouth daily.  Dispense: 90 tablet; Refill: 1  3. Mixed hyperlipidemia - atorvastatin (LIPITOR) 20 MG tablet; Take 1 tablet (20 mg total) by mouth at bedtime.  Dispense: 90  tablet; Refill: 1  4. Chronic combined systolic and diastolic CHF, NYHA class 2 (HCC)  5. Cardiomyopathy due to hypertension, with heart failure (HCC) - spironolactone (ALDACTONE) 25 MG tablet; Take 1 tablet (25 mg total) by mouth daily.  Dispense: 90 tablet; Refill: 1  6. Elevated PSA   Pt has h/o CHF, and hypertensive cardiomyopathy- seeing cardiology in past.  Last note in 2016.  Last echo 2016, EF 40% and NYHA II.  Had flu vaccine 9/21. meds refilled.    htn- stable. Cont meds. Reviewed labs.  DM2- stable. Cont meds.  Reviewed labs.  a1c today in office 6.4, improved from last visit at 7.0.  Elevated Cr- at 1.3-1.4, improved on this visit at 1.31.  CHF- stable. Cont meds.  cont f/u cards.  Elevated psa- seeing urology.  Was elevated in past.  Just monitoring it at this time. Last one in our chart 2016- 8.7.   F/u 70mo or prn.

## 2020-10-31 DIAGNOSIS — Z23 Encounter for immunization: Secondary | ICD-10-CM | POA: Diagnosis not present

## 2020-11-29 ENCOUNTER — Telehealth: Payer: Self-pay | Admitting: Family Medicine

## 2020-11-29 NOTE — Telephone Encounter (Signed)
Left message to return call 

## 2020-11-30 NOTE — Telephone Encounter (Signed)
Pt states he is seeing Dr. Liliane Shi at [facility] urology. States his next appt is in May

## 2021-01-02 ENCOUNTER — Other Ambulatory Visit: Payer: Self-pay | Admitting: *Deleted

## 2021-01-02 MED ORDER — FINASTERIDE 5 MG PO TABS
5.0000 mg | ORAL_TABLET | Freq: Every day | ORAL | 0 refills | Status: DC
Start: 2021-01-02 — End: 2021-03-30

## 2021-02-05 ENCOUNTER — Other Ambulatory Visit: Payer: Self-pay | Admitting: Family Medicine

## 2021-02-05 NOTE — Telephone Encounter (Signed)
Med check up 10/17/20

## 2021-03-30 ENCOUNTER — Other Ambulatory Visit: Payer: Self-pay | Admitting: Family Medicine

## 2021-03-30 NOTE — Telephone Encounter (Signed)
Left message to schedule appointment

## 2021-03-30 NOTE — Telephone Encounter (Signed)
Needs visit and then route back to nurses 

## 2021-03-30 NOTE — Telephone Encounter (Signed)
Schedule appointment 4/12

## 2021-04-05 DIAGNOSIS — Z23 Encounter for immunization: Secondary | ICD-10-CM | POA: Diagnosis not present

## 2021-04-10 ENCOUNTER — Encounter: Payer: Self-pay | Admitting: Family Medicine

## 2021-04-10 ENCOUNTER — Ambulatory Visit (INDEPENDENT_AMBULATORY_CARE_PROVIDER_SITE_OTHER): Payer: Medicare Other | Admitting: Family Medicine

## 2021-04-10 ENCOUNTER — Other Ambulatory Visit: Payer: Self-pay

## 2021-04-10 VITALS — BP 130/80 | HR 89 | Temp 97.2°F | Ht 69.0 in | Wt 253.2 lb

## 2021-04-10 DIAGNOSIS — R972 Elevated prostate specific antigen [PSA]: Secondary | ICD-10-CM | POA: Diagnosis not present

## 2021-04-10 DIAGNOSIS — Z8042 Family history of malignant neoplasm of prostate: Secondary | ICD-10-CM | POA: Diagnosis not present

## 2021-04-10 DIAGNOSIS — I10 Essential (primary) hypertension: Secondary | ICD-10-CM | POA: Diagnosis not present

## 2021-04-10 DIAGNOSIS — E782 Mixed hyperlipidemia: Secondary | ICD-10-CM

## 2021-04-10 DIAGNOSIS — E119 Type 2 diabetes mellitus without complications: Secondary | ICD-10-CM | POA: Diagnosis not present

## 2021-04-10 NOTE — Progress Notes (Signed)
Patient ID: KOY LAMP, male    DOB: January 08, 1951, 70 y.o.   MRN: [id number]   Chief Complaint  Patient presents with  . Diabetes   Subjective:    HPI Pt here for follow up DM2.  Pt states sugars have been "good". No issues. Taking all meds as directed.   No issues with diabetic meds.  No foot issues. Had old shoes on top of foot.  Hurt on top of foot and is better today.  Had some elevated psa in past.  [facility].  Last time psa 5.13.  Had prostate 4-5 yrs ago.  Ran out of finasteride in past, then once back on it the numbers were improved.  Seeing them 1x per yr.  H/o brother with prostate cancer.  brother was in prostate cancer trial called [employer].  Used to see cardiology, not seen recently.  Doing well.   Medical History Jeremy Rivera has a past medical history of Diabetes mellitus without complication ([place]), Diastolic dysfunction, ED (erectile dysfunction), Herniated lumbar intervertebral disc, Hypertension, Irregular heart beat, and Prostate hypertrophy.   Outpatient Encounter Medications as of 04/10/2021  Medication Sig  . amLODipine-benazepril (LOTREL) 10-40 MG capsule Take 1 capsule by mouth daily.  Marland Kitchen aspirin EC 81 MG tablet Take 81 mg by mouth daily.  Marland Kitchen atorvastatin (LIPITOR) 20 MG tablet Take 1 tablet (20 mg total) by mouth at bedtime.  . chlorthalidone (HYGROTON) 25 MG tablet Take 1 tablet (25 mg total) by mouth daily.  . finasteride (PROSCAR) 5 MG tablet TAKE 1 TABLET (5 MG TOTAL) BY MOUTH DAILY.  Marland Kitchen glipiZIDE (GLUCOTROL) 5 MG tablet Take two tablets in the morning and two tablets in the evening  . glucose blood (ONE TOUCH ULTRA TEST) test strip TEST BLOOD SUGAR ONCE DAILY AS DIRECTED **DX E11.9  . metFORMIN (GLUCOPHAGE) 500 MG tablet Take 1 tablet (500 mg total) by mouth 2 (two) times daily with a meal.  . spironolactone (ALDACTONE) 25 MG tablet Take 1 tablet (25 mg total) by mouth daily.  Marland Kitchen triamcinolone (KENALOG) 0.1 % APPLY THIN LAYER TWICE  DAILY TO RASH   No facility-administered encounter medications on file as of 04/10/2021.     Review of Systems  Constitutional: Negative for chills and fever.  HENT: Negative for congestion, rhinorrhea and sore throat.   Respiratory: Negative for cough, shortness of breath and wheezing.   Cardiovascular: Negative for chest pain and leg swelling.  Gastrointestinal: Negative for abdominal pain, diarrhea, nausea and vomiting.  Genitourinary: Negative for dysuria and frequency.  Skin: Negative for rash.  Neurological: Negative for dizziness, weakness and headaches.     Vitals BP 130/80   Pulse 89   Temp (!) 97.2 F (36.2 C)   Ht 5' 9"  (1.753 m)   Wt 253 lb 3.2 oz (114.9 kg)   SpO2 97%   BMI 37.39 kg/m   Objective:   Physical Exam Vitals and nursing note reviewed.  Constitutional:      General: He is not in acute distress.    Appearance: Normal appearance. He is not ill-appearing.  HENT:     Head: Normocephalic.     Nose: Nose normal. No congestion.     Mouth/Throat:     Mouth: Mucous membranes are moist.     Pharynx: No oropharyngeal exudate.  Eyes:     Extraocular Movements: Extraocular movements intact.     Conjunctiva/sclera: Conjunctivae normal.     Pupils: Pupils are equal, round, and reactive to light.  Cardiovascular:  Rate and Rhythm: Normal rate and regular rhythm.     Pulses: Normal pulses.     Heart sounds: Normal heart sounds. No murmur heard.   Pulmonary:     Effort: Pulmonary effort is normal.     Breath sounds: Normal breath sounds. No wheezing, rhonchi or rales.  Musculoskeletal:        General: Normal range of motion.     Right lower leg: No edema.     Left lower leg: No edema.  Skin:    General: Skin is warm and dry.     Findings: No rash.  Neurological:     General: No focal deficit present.     Mental Status: He is alert and oriented to person, place, and time.     Cranial Nerves: No cranial nerve deficit.  Psychiatric:        Mood  and Affect: Mood normal.        Behavior: Behavior normal.        Thought Content: Thought content normal.        Judgment: Judgment normal.      Assessment and Plan   1. Essential hypertension, benign - CBC - CMP14+EGFR  2. Controlled type 2 diabetes mellitus without complication, without long-term current use of insulin (HCC) - CMP14+EGFR - Hemoglobin A1c - Microalbumin, urine  3. Mixed hyperlipidemia - Lipid panel  4. Family history of prostate cancer  5. Elevated PSA   dm2- stable. Cont meds.  a1c- 7.4, increased from last time at 6.4.  htn- stable. Cont meds.  hld- stable. Slight increase compared to 1 yr ago. Cont meds.  Had covid vaccines and booster.  Elevated psa- stable. cont f/u with urology.  Return in about 6 months (around 10/10/2021) for f/u dm2, htn, hld.

## 2021-04-18 DIAGNOSIS — I10 Essential (primary) hypertension: Secondary | ICD-10-CM | POA: Diagnosis not present

## 2021-04-18 DIAGNOSIS — E782 Mixed hyperlipidemia: Secondary | ICD-10-CM | POA: Diagnosis not present

## 2021-04-18 DIAGNOSIS — E1165 Type 2 diabetes mellitus with hyperglycemia: Secondary | ICD-10-CM | POA: Diagnosis not present

## 2021-04-19 LAB — CBC
Hematocrit: 43.6 % (ref 37.5–51.0)
Hemoglobin: 14.5 g/dL (ref 13.0–17.7)
MCH: 28 pg (ref 26.6–33.0)
MCHC: 33.3 g/dL (ref 31.5–35.7)
MCV: 84 fL (ref 79–97)
Platelets: 221 10*3/uL (ref 150–450)
RBC: 5.18 x10E6/uL (ref 4.14–5.80)
RDW: 13.5 % (ref 11.6–15.4)
WBC: 8.2 10*3/uL (ref 3.4–10.8)

## 2021-04-19 LAB — HEMOGLOBIN A1C
Est. average glucose Bld gHb Est-mCnc: 166 mg/dL
Hgb A1c MFr Bld: 7.4 % — ABNORMAL HIGH (ref 4.8–5.6)

## 2021-04-19 LAB — CMP14+EGFR
ALT: 18 IU/L (ref 0–44)
AST: 15 IU/L (ref 0–40)
Albumin/Globulin Ratio: 1.6 (ref 1.2–2.2)
Albumin: 4.4 g/dL (ref 3.7–4.7)
Alkaline Phosphatase: 65 IU/L (ref 44–121)
BUN/Creatinine Ratio: 11 (ref 10–24)
BUN: 12 mg/dL (ref 8–27)
Bilirubin Total: 0.4 mg/dL (ref 0.0–1.2)
CO2: 23 mmol/L (ref 20–29)
Calcium: 9.3 mg/dL (ref 8.6–10.2)
Chloride: 95 mmol/L — ABNORMAL LOW (ref 96–106)
Creatinine, Ser: 1.12 mg/dL (ref 0.76–1.27)
Globulin, Total: 2.7 g/dL (ref 1.5–4.5)
Glucose: 124 mg/dL — ABNORMAL HIGH (ref 65–99)
Potassium: 5.1 mmol/L (ref 3.5–5.2)
Sodium: 134 mmol/L (ref 134–144)
Total Protein: 7.1 g/dL (ref 6.0–8.5)
eGFR: 70 mL/min/1.73 (ref >59)

## 2021-04-19 LAB — LIPID PANEL
Chol/HDL Ratio: 5.4 ratio — ABNORMAL HIGH (ref 0.0–5.0)
Cholesterol, Total: 209 mg/dL — ABNORMAL HIGH (ref 100–199)
HDL: 39 mg/dL — ABNORMAL LOW (ref >39)
LDL Chol Calc (NIH): 140 mg/dL — ABNORMAL HIGH (ref 0–99)
Triglycerides: 164 mg/dL — ABNORMAL HIGH (ref 0–149)
VLDL Cholesterol Cal: 30 mg/dL (ref 5–40)

## 2021-04-19 LAB — MICROALBUMIN, URINE: Microalbumin, Urine: 30.2 ug/mL

## 2021-04-26 DIAGNOSIS — R972 Elevated prostate specific antigen [PSA]: Secondary | ICD-10-CM | POA: Diagnosis not present

## 2021-04-27 ENCOUNTER — Telehealth: Payer: Self-pay | Admitting: Family Medicine

## 2021-04-27 NOTE — Telephone Encounter (Signed)
Left message for patient to schedule Annual Wellness Visit.  Please schedule with Nurse Health Advisor Shannon Crews, RN at [facility]  

## 2021-05-03 DIAGNOSIS — R350 Frequency of micturition: Secondary | ICD-10-CM | POA: Diagnosis not present

## 2021-05-03 DIAGNOSIS — N401 Enlarged prostate with lower urinary tract symptoms: Secondary | ICD-10-CM | POA: Diagnosis not present

## 2021-05-03 DIAGNOSIS — R972 Elevated prostate specific antigen [PSA]: Secondary | ICD-10-CM | POA: Diagnosis not present

## 2021-06-12 ENCOUNTER — Other Ambulatory Visit: Payer: Self-pay | Admitting: Family Medicine

## 2021-06-21 ENCOUNTER — Other Ambulatory Visit: Payer: Self-pay | Admitting: Family Medicine

## 2021-06-21 DIAGNOSIS — I110 Hypertensive heart disease with heart failure: Secondary | ICD-10-CM

## 2021-06-21 DIAGNOSIS — E119 Type 2 diabetes mellitus without complications: Secondary | ICD-10-CM

## 2021-06-21 DIAGNOSIS — E782 Mixed hyperlipidemia: Secondary | ICD-10-CM

## 2021-06-21 DIAGNOSIS — I10 Essential (primary) hypertension: Secondary | ICD-10-CM

## 2021-06-21 DIAGNOSIS — I43 Cardiomyopathy in diseases classified elsewhere: Secondary | ICD-10-CM

## 2021-06-24 ENCOUNTER — Other Ambulatory Visit: Payer: Self-pay | Admitting: Family Medicine

## 2021-07-07 ENCOUNTER — Other Ambulatory Visit: Payer: Self-pay | Admitting: Family Medicine

## 2021-07-07 DIAGNOSIS — I10 Essential (primary) hypertension: Secondary | ICD-10-CM

## 2021-09-27 DIAGNOSIS — Z23 Encounter for immunization: Secondary | ICD-10-CM | POA: Diagnosis not present

## 2021-09-28 ENCOUNTER — Other Ambulatory Visit: Payer: Self-pay | Admitting: Family Medicine

## 2021-10-18 ENCOUNTER — Other Ambulatory Visit: Payer: Self-pay | Admitting: Family Medicine

## 2021-10-20 ENCOUNTER — Other Ambulatory Visit: Payer: Self-pay | Admitting: Nurse Practitioner

## 2021-11-17 ENCOUNTER — Other Ambulatory Visit: Payer: Self-pay | Admitting: Family Medicine

## 2021-12-13 ENCOUNTER — Other Ambulatory Visit: Payer: Self-pay | Admitting: Family Medicine

## 2021-12-13 DIAGNOSIS — I110 Hypertensive heart disease with heart failure: Secondary | ICD-10-CM

## 2021-12-13 DIAGNOSIS — I10 Essential (primary) hypertension: Secondary | ICD-10-CM

## 2021-12-13 DIAGNOSIS — E119 Type 2 diabetes mellitus without complications: Secondary | ICD-10-CM

## 2021-12-13 DIAGNOSIS — E782 Mixed hyperlipidemia: Secondary | ICD-10-CM

## 2022-01-07 ENCOUNTER — Other Ambulatory Visit: Payer: Self-pay | Admitting: Family Medicine

## 2022-01-07 DIAGNOSIS — I10 Essential (primary) hypertension: Secondary | ICD-10-CM

## 2022-01-11 MED ORDER — AMLODIPINE BESY-BENAZEPRIL HCL 10-40 MG PO CAPS: ORAL_CAPSULE | ORAL | 0 refills | Status: DC

## 2022-01-11 NOTE — Telephone Encounter (Signed)
Has appointment on 01/21/22 °

## 2022-01-11 NOTE — Addendum Note (Signed)
Addended by: Margaretha Sheffield on: 01/11/2022 11:47 AM   Modules accepted: Orders

## 2022-01-21 ENCOUNTER — Other Ambulatory Visit: Payer: Self-pay | Admitting: Family Medicine

## 2022-01-21 ENCOUNTER — Ambulatory Visit (INDEPENDENT_AMBULATORY_CARE_PROVIDER_SITE_OTHER): Payer: Medicare Other | Admitting: Family Medicine

## 2022-01-21 ENCOUNTER — Encounter: Payer: Self-pay | Admitting: Family Medicine

## 2022-01-21 ENCOUNTER — Other Ambulatory Visit: Payer: Self-pay

## 2022-01-21 VITALS — BP 140/82 | HR 94 | Temp 98.7°F | Wt 249.8 lb

## 2022-01-21 DIAGNOSIS — E782 Mixed hyperlipidemia: Secondary | ICD-10-CM | POA: Diagnosis not present

## 2022-01-21 DIAGNOSIS — I5042 Chronic combined systolic (congestive) and diastolic (congestive) heart failure: Secondary | ICD-10-CM | POA: Diagnosis not present

## 2022-01-21 DIAGNOSIS — E119 Type 2 diabetes mellitus without complications: Secondary | ICD-10-CM | POA: Diagnosis not present

## 2022-01-21 DIAGNOSIS — I10 Essential (primary) hypertension: Secondary | ICD-10-CM

## 2022-01-21 MED ORDER — SPIRONOLACTONE 25 MG PO TABS: 25.0000 mg | ORAL_TABLET | Freq: Every day | ORAL | 3 refills | Status: DC

## 2022-01-21 MED ORDER — ACCU-CHEK SOFTCLIX LANCETS MISC: 12 refills | Status: DC

## 2022-01-21 MED ORDER — AMLODIPINE BESY-BENAZEPRIL HCL 10-40 MG PO CAPS: ORAL_CAPSULE | ORAL | 3 refills | Status: DC

## 2022-01-21 MED ORDER — FINASTERIDE 5 MG PO TABS: 5.0000 mg | ORAL_TABLET | Freq: Every day | ORAL | 3 refills | Status: DC

## 2022-01-21 MED ORDER — METFORMIN HCL 500 MG PO TABS: 500.0000 mg | ORAL_TABLET | Freq: Two times a day (BID) | ORAL | 3 refills | Status: DC

## 2022-01-21 MED ORDER — GLIPIZIDE 5 MG PO TABS: 10.0000 mg | ORAL_TABLET | Freq: Two times a day (BID) | ORAL | 3 refills | Status: DC

## 2022-01-21 MED ORDER — CHLORTHALIDONE 25 MG PO TABS: 25.0000 mg | ORAL_TABLET | Freq: Every day | ORAL | 3 refills | Status: DC

## 2022-01-21 NOTE — Assessment & Plan Note (Signed)
Prior history of CHF.  He has had no follow-up and years.  Arranging echocardiogram.

## 2022-01-21 NOTE — Assessment & Plan Note (Signed)
Uncontrolled. Lipid panel today. 

## 2022-01-21 NOTE — Assessment & Plan Note (Signed)
Unsure of current control.  A1c today.  Continue metformin and glipizide.  Given his history of CHF, will likely benefit from addition of Jardiance.

## 2022-01-21 NOTE — Patient Instructions (Addendum)
Labs today.  Try the new lancet (recommended by my pharmacist).  Follow up in 3-6 months.  Take care  Dr. Lacinda Axon

## 2022-01-21 NOTE — Assessment & Plan Note (Signed)
Stable.  Continue current medications.  Refilled today. 

## 2022-01-21 NOTE — Progress Notes (Addendum)
Subjective:  Patient ID: Jeremy Rivera, male    DOB: 07-25-1951  Age: 71 y.o. MRN: [id number]  CC: Chief Complaint  Patient presents with   Establish Care    Needs new meter-using AccuCheck currently(would like something that stings less)     HPI:  71 year old male with hypertension, type 2 diabetes, hyperlipidemia, history of CHF presents for follow-up.  Type 2 diabetes Patient states that he checks his sugar fairly regularly.  Fastings in the 120s to 130s. Has not had an A1c since April of last year.  Last A1c was 7.4. He is currently on metformin 500 mg twice daily and glipizide 10 mg twice daily. Patient would like a different lancet.  He states that his current lancet causes more pain than he would like. No reports of hypoglycemia.  Hypertension Patient is currently on amlodipine, benazepril, chlorthalidone, and spironolactone.  Needs refills.  Hyperlipidemia Uncontrolled.  Patient states that he is no longer taking Lipitor.  He states that he believes it caused him to have a lesion on his nose.  He states that he stopped the medication and it went away with him and he restarted back it occurred again.  Therefore he does not take the medication any longer. Needs labs.  Last lipid panel was in April.  LDL was 140 at the time.  History of CHF Previously seen by cardiology.  Last echocardiogram was in 2017 and revealed an ejection fraction of 40 to 45%.  He had a reassuring stress test and 2017 as well.  Has not seen cardiology since October 2017. Denies any current chest pain or shortness of breath.  He states that he is feeling well.  Patient Active Problem List   Diagnosis Date Noted   Type 2 diabetes mellitus ([place]) 01/21/2022   Family history of prostate cancer 04/10/2021   Chronic combined systolic and diastolic CHF, NYHA class 2 ([place]) 07/14/2015   Essential hypertension, benign 07/25/2013   Mixed hyperlipidemia 07/25/2013   Erectile dysfunction 07/25/2013     Social Hx   Social History   Socioeconomic History   Marital status: Married    Spouse name: Not on file   Number of children: Not on file   Years of education: Not on file   Highest education level: Not on file  Occupational History   Not on file  Tobacco Use   Smoking status: Never   Smokeless tobacco: Never  Substance and Sexual Activity   Alcohol use: No    Alcohol/week: 0.0 standard drinks   Drug use: No   Sexual activity: Yes    Partners: Female  Other Topics Concern   Not on file  Social History Narrative   Not on file   Social Determinants of Health   Financial Resource Strain: Not on file  Food Insecurity: Not on file  Transportation Needs: Not on file  Physical Activity: Not on file  Stress: Not on file  Social Connections: Not on file    Review of Systems Per HPI  Objective:  BP 140/82    Pulse 94    Temp 98.7 F (37.1 C)    Wt 249 lb 12.8 oz (113.3 kg)    SpO2 95%    BMI 36.89 kg/m   BP/Weight 01/21/2022 04/10/2021 81/27/5170  Systolic BP 017 494 496  Diastolic BP 82 80 76  Wt. (Lbs) 249.8 253.2 250.2  BMI 36.89 37.39 36.95    Physical Exam Vitals and nursing note reviewed.  Constitutional:  General: He is not in acute distress.    Appearance: Normal appearance. He is not ill-appearing.  HENT:     Head: Normocephalic and atraumatic.  Cardiovascular:     Rate and Rhythm: Normal rate and regular rhythm.  Pulmonary:     Effort: Pulmonary effort is normal.     Breath sounds: Normal breath sounds. No wheezing, rhonchi or rales.  Neurological:     Mental Status: He is alert.  Psychiatric:        Mood and Affect: Mood normal.        Behavior: Behavior normal.    Lab Results  Component Value Date   WBC 8.2 04/18/2021   HGB 14.5 04/18/2021   HCT 43.6 04/18/2021   PLT 221 04/18/2021   GLUCOSE 124 (H) 04/18/2021   CHOL 209 (H) 04/18/2021   TRIG 164 (H) 04/18/2021   HDL 39 (L) 04/18/2021   LDLCALC 140 (H) 04/18/2021   ALT 18  04/18/2021   AST 15 04/18/2021   NA 134 04/18/2021   K 5.1 04/18/2021   CL 95 (L) 04/18/2021   CREATININE 1.12 04/18/2021   BUN 12 04/18/2021   CO2 23 04/18/2021   TSH 1.382 07/12/2015   PSA 6.47 (H) 04/05/2014   HGBA1C 7.4 (H) 04/18/2021   MICROALBUR 3.2 (H) 01/03/2015     Assessment & Plan:   Problem List Items Addressed This Visit       Cardiovascular and Mediastinum   Essential hypertension, benign - Primary    Stable. Continue current medications. Refilled today.      Relevant Medications   amLODipine-benazepril (LOTREL) 10-40 MG capsule   chlorthalidone (HYGROTON) 25 MG tablet   spironolactone (ALDACTONE) 25 MG tablet   Other Relevant Orders   CMP14+EGFR   Chronic combined systolic and diastolic CHF, NYHA class 2 (HCC)    Prior history of CHF.  He has had no follow-up and years.  Arranging echocardiogram.      Relevant Medications   amLODipine-benazepril (LOTREL) 10-40 MG capsule   chlorthalidone (HYGROTON) 25 MG tablet   spironolactone (ALDACTONE) 25 MG tablet   Other Relevant Orders   CBC     Endocrine   Type 2 diabetes mellitus (HCC)    Unsure of current control.  A1c today.  Continue metformin and glipizide.  Given his history of CHF, will likely benefit from addition of Jardiance.      Relevant Medications   amLODipine-benazepril (LOTREL) 10-40 MG capsule   metFORMIN (GLUCOPHAGE) 500 MG tablet   glipiZIDE (GLUCOTROL) 5 MG tablet   Other Relevant Orders   Hemoglobin A1c     Other   Mixed hyperlipidemia    Uncontrolled.  Lipid panel today.      Relevant Medications   amLODipine-benazepril (LOTREL) 10-40 MG capsule   chlorthalidone (HYGROTON) 25 MG tablet   spironolactone (ALDACTONE) 25 MG tablet   Other Relevant Orders   Lipid panel    Meds ordered this encounter  Medications   Accu-Chek Softclix Lancets lancets    Sig: Use as instructed    Dispense:  100 each    Refill:  12   amLODipine-benazepril (LOTREL) 10-40 MG capsule    Sig:  TAKE 1 CAPSULE BY MOUTH EVERY DAY    Dispense:  90 capsule    Refill:  3   chlorthalidone (HYGROTON) 25 MG tablet    Sig: Take 1 tablet (25 mg total) by mouth daily.    Dispense:  90 tablet    Refill:  3  finasteride (PROSCAR) 5 MG tablet    Sig: Take 1 tablet (5 mg total) by mouth daily.    Dispense:  90 tablet    Refill:  3   metFORMIN (GLUCOPHAGE) 500 MG tablet    Sig: Take 1 tablet (500 mg total) by mouth 2 (two) times daily with a meal.    Dispense:  180 tablet    Refill:  3    Needs appt   spironolactone (ALDACTONE) 25 MG tablet    Sig: Take 1 tablet (25 mg total) by mouth daily.    Dispense:  90 tablet    Refill:  3    Needs appt   glipiZIDE (GLUCOTROL) 5 MG tablet    Sig: Take 2 tablets (10 mg total) by mouth 2 (two) times daily before a meal.    Dispense:  360 tablet    Refill:  3    Needs appt    Follow-up:  3-6 months.  [place]

## 2022-01-22 LAB — HEMOGLOBIN A1C
Est. average glucose Bld gHb Est-mCnc: 177 mg/dL
Hgb A1c MFr Bld: 7.8 % — ABNORMAL HIGH (ref 4.8–5.6)

## 2022-01-22 LAB — CMP14+EGFR
ALT: 11 IU/L (ref 0–44)
AST: 11 IU/L (ref 0–40)
Albumin/Globulin Ratio: 2.1 (ref 1.2–2.2)
Albumin: 4.6 g/dL (ref 3.7–4.7)
Alkaline Phosphatase: 73 IU/L (ref 44–121)
BUN/Creatinine Ratio: 12 (ref 10–24)
BUN: 16 mg/dL (ref 8–27)
Bilirubin Total: 0.3 mg/dL (ref 0.0–1.2)
CO2: 25 mmol/L (ref 20–29)
Calcium: 10.4 mg/dL — ABNORMAL HIGH (ref 8.6–10.2)
Chloride: 96 mmol/L (ref 96–106)
Creatinine, Ser: 1.3 mg/dL — ABNORMAL HIGH (ref 0.76–1.27)
Globulin, Total: 2.2 g/dL (ref 1.5–4.5)
Glucose: 181 mg/dL — ABNORMAL HIGH (ref 70–99)
Potassium: 4.6 mmol/L (ref 3.5–5.2)
Sodium: 134 mmol/L (ref 134–144)
Total Protein: 6.8 g/dL (ref 6.0–8.5)
eGFR: 58 mL/min/{1.73_m2} — ABNORMAL LOW (ref >59)

## 2022-01-22 LAB — CBC
Hematocrit: 43.5 % (ref 37.5–51.0)
Hemoglobin: 14.2 g/dL (ref 13.0–17.7)
MCH: 27.9 pg (ref 26.6–33.0)
MCHC: 32.6 g/dL (ref 31.5–35.7)
MCV: 86 fL (ref 79–97)
Platelets: 237 10*3/uL (ref 150–450)
RBC: 5.09 x10E6/uL (ref 4.14–5.80)
RDW: 13.5 % (ref 11.6–15.4)
WBC: 8.2 10*3/uL (ref 3.4–10.8)

## 2022-01-22 LAB — LIPID PANEL
Chol/HDL Ratio: 5.2 ratio — ABNORMAL HIGH (ref 0.0–5.0)
Cholesterol, Total: 213 mg/dL — ABNORMAL HIGH (ref 100–199)
HDL: 41 mg/dL (ref >39)
LDL Chol Calc (NIH): 138 mg/dL — ABNORMAL HIGH (ref 0–99)
Triglycerides: 189 mg/dL — ABNORMAL HIGH (ref 0–149)
VLDL Cholesterol Cal: 34 mg/dL (ref 5–40)

## 2022-01-23 ENCOUNTER — Other Ambulatory Visit: Payer: Self-pay | Admitting: Family Medicine

## 2022-01-23 MED ORDER — ROSUVASTATIN CALCIUM 10 MG PO TABS: 10.0000 mg | ORAL_TABLET | Freq: Every day | ORAL | 3 refills | Status: DC

## 2022-01-23 MED ORDER — EMPAGLIFLOZIN 10 MG PO TABS: 10.0000 mg | ORAL_TABLET | Freq: Every day | ORAL | 1 refills | Status: DC

## 2022-01-24 ENCOUNTER — Telehealth: Payer: Self-pay | Admitting: Family Medicine

## 2022-01-24 DIAGNOSIS — E119 Type 2 diabetes mellitus without complications: Secondary | ICD-10-CM

## 2022-01-24 NOTE — Telephone Encounter (Signed)
Pt called and stated his Jardiance at [facility] was $408.00. He said he could not afford this and was there something else that could be called in.   [phone]

## 2022-01-25 MED ORDER — BLOOD GLUCOSE METER KIT: PACK | 0 refills | Status: AC

## 2022-01-25 NOTE — Telephone Encounter (Signed)
Patient informed of md message. Verbalized understanding. Patient also wanted a new blood glucose meter and supplies sent to the pharmacy. Ok to sent in per Dr. Lacinda Axon.  Advised pt rx's sent to pharmacy. Verbalized understanding

## 2022-02-05 ENCOUNTER — Other Ambulatory Visit: Payer: Self-pay

## 2022-02-05 ENCOUNTER — Ambulatory Visit (INDEPENDENT_AMBULATORY_CARE_PROVIDER_SITE_OTHER): Payer: Medicare Other

## 2022-02-05 ENCOUNTER — Telehealth: Payer: Self-pay

## 2022-02-05 VITALS — BP 138/82 | HR 82 | Ht 71.0 in | Wt 247.0 lb

## 2022-02-05 DIAGNOSIS — Z0000 Encounter for general adult medical examination without abnormal findings: Secondary | ICD-10-CM | POA: Diagnosis not present

## 2022-02-05 NOTE — Telephone Encounter (Signed)
Pt here for AWV. Pt states he needs an Rx for testing strips for his new meter. Thank you.

## 2022-02-05 NOTE — Patient Instructions (Signed)
[name] , Thank you for taking time to come for your Medicare Wellness Visit. I appreciate your ongoing commitment to your health goals. Please review the following plan we discussed and let me know if I can assist you in the future.   Screening recommendations/referrals: Colonoscopy: Done 01/25/2015 Repeat in 10 years  Recommended yearly ophthalmology/optometry visit for glaucoma screening and checkup Recommended yearly dental visit for hygiene and checkup  Vaccinations: Influenza vaccine: Done 09/27/2021 Repeat annually  Pneumococcal vaccine: Done 12/12/2014 and 12/18/2015 Tdap vaccine: Due Repeat in 10 years  Shingles vaccine: Done 12/21/2017. Covid-19: Done 02/03/2020, 03/06/2020, 04/05/2021 and 09/22/2021    Advanced directives: Advance directive discussed with you today. Even though you declined this today, please call our office should you change your mind, and we can give you the proper paperwork for you to fill out.   Conditions/risks identified: Aim for 30 minutes of exercise or brisk walking each day, drink 6-8 glasses of water and eat lots of fruits and vegetables.   Next appointment: Follow up in one year for your annual wellness visit. 2024.  Preventive Care 73 Years and Older, Male  Preventive care refers to lifestyle choices and visits with your health care provider that can promote health and wellness. What does preventive care include? A yearly physical exam. This is also called an annual well check. Dental exams once or twice a year. Routine eye exams. Ask your health care provider how often you should have your eyes checked. Personal lifestyle choices, including: Daily care of your teeth and gums. Regular physical activity. Eating a healthy diet. Avoiding tobacco and drug use. Limiting alcohol use. Practicing safe sex. Taking low doses of aspirin every day. Taking vitamin and mineral supplements as recommended by your health care provider. What happens during  an annual well check? The services and screenings done by your health care provider during your annual well check will depend on your age, overall health, lifestyle risk factors, and family history of disease. Counseling  Your health care provider may ask you questions about your: Alcohol use. Tobacco use. Drug use. Emotional well-being. Home and relationship well-being. Sexual activity. Eating habits. History of falls. Memory and ability to understand (cognition). Work and work Astronomer. Screening  You may have the following tests or measurements: Height, weight, and BMI. Blood pressure. Lipid and cholesterol levels. These may be checked every 5 years, or more frequently if you are over 29 years old. Skin check. Lung cancer screening. You may have this screening every year starting at age 73 if you have a 30-pack-year history of smoking and currently smoke or have quit within the past 15 years. Fecal occult blood test (FOBT) of the stool. You may have this test every year starting at age 73. Flexible sigmoidoscopy or colonoscopy. You may have a sigmoidoscopy every 5 years or a colonoscopy every 10 years starting at age 73. Prostate cancer screening. Recommendations will vary depending on your family history and other risks. Hepatitis C blood test. Hepatitis B blood test. Sexually transmitted disease (STD) testing. Diabetes screening. This is done by checking your blood sugar (glucose) after you have not eaten for a while (fasting). You may have this done every 1-3 years. Abdominal aortic aneurysm (AAA) screening. You may need this if you are a current or former smoker. Osteoporosis. You may be screened starting at age 73 if you are at high risk. Talk with your health care provider about your test results, treatment options, and if necessary, the need for  more tests. Vaccines  Your health care provider may recommend certain vaccines, such as: Influenza vaccine. This is recommended  every year. Tetanus, diphtheria, and acellular pertussis (Tdap, Td) vaccine. You may need a Td booster every 10 years. Zoster vaccine. You may need this after age 73. Pneumococcal 13-valent conjugate (PCV13) vaccine. One dose is recommended after age 73. Pneumococcal polysaccharide (PPSV23) vaccine. One dose is recommended after age 73. Talk to your health care provider about which screenings and vaccines you need and how often you need them. This information is not intended to replace advice given to you by your health care provider. Make sure you discuss any questions you have with your health care provider. Document Released: 01/12/2016 Document Revised: 09/04/2016 Document Reviewed: 10/17/2015 Elsevier Interactive Patient Education  2017 [place] Prevention in the Home Falls can cause injuries. They can happen to people of all ages. There are many things you can do to make your home safe and to help prevent falls. What can I do on the outside of my home? Regularly fix the edges of walkways and driveways and fix any cracks. Remove anything that might make you trip as you walk through a door, such as a raised step or threshold. Trim any bushes or trees on the path to your home. Use bright outdoor lighting. Clear any walking paths of anything that might make someone trip, such as rocks or tools. Regularly check to see if handrails are loose or broken. Make sure that both sides of any steps have handrails. Any raised decks and porches should have guardrails on the edges. Have any leaves, snow, or ice cleared regularly. Use sand or salt on walking paths during winter. Clean up any spills in your garage right away. This includes oil or grease spills. What can I do in the bathroom? Use night lights. Install grab bars by the toilet and in the tub and shower. Do not use towel bars as grab bars. Use non-skid mats or decals in the tub or shower. If you need to sit down in the shower,  use a plastic, non-slip stool. Keep the floor dry. Clean up any water that spills on the floor as soon as it happens. Remove soap buildup in the tub or shower regularly. Attach bath mats securely with double-sided non-slip rug tape. Do not have throw rugs and other things on the floor that can make you trip. What can I do in the bedroom? Use night lights. Make sure that you have a light by your bed that is easy to reach. Do not use any sheets or blankets that are too big for your bed. They should not hang down onto the floor. Have a firm chair that has side arms. You can use this for support while you get dressed. Do not have throw rugs and other things on the floor that can make you trip. What can I do in the kitchen? Clean up any spills right away. Avoid walking on wet floors. Keep items that you use a lot in easy-to-reach places. If you need to reach something above you, use a strong step stool that has a grab bar. Keep electrical cords out of the way. Do not use floor polish or wax that makes floors slippery. If you must use wax, use non-skid floor wax. Do not have throw rugs and other things on the floor that can make you trip. What can I do with my stairs? Do not leave any items on the stairs. Make  sure that there are handrails on both sides of the stairs and use them. Fix handrails that are broken or loose. Make sure that handrails are as long as the stairways. Check any carpeting to make sure that it is firmly attached to the stairs. Fix any carpet that is loose or worn. Avoid having throw rugs at the top or bottom of the stairs. If you do have throw rugs, attach them to the floor with carpet tape. Make sure that you have a light switch at the top of the stairs and the bottom of the stairs. If you do not have them, ask someone to add them for you. What else can I do to help prevent falls? Wear shoes that: Do not have high heels. Have rubber bottoms. Are comfortable and fit you  well. Are closed at the toe. Do not wear sandals. If you use a stepladder: Make sure that it is fully opened. Do not climb a closed stepladder. Make sure that both sides of the stepladder are locked into place. Ask someone to hold it for you, if possible. Clearly mark and make sure that you can see: Any grab bars or handrails. First and last steps. Where the edge of each step is. Use tools that help you move around (mobility aids) if they are needed. These include: Canes. Walkers. Scooters. Crutches. Turn on the lights when you go into a dark area. Replace any light bulbs as soon as they burn out. Set up your furniture so you have a clear path. Avoid moving your furniture around. If any of your floors are uneven, fix them. If there are any pets around you, be aware of where they are. Review your medicines with your doctor. Some medicines can make you feel dizzy. This can increase your chance of falling. Ask your doctor what other things that you can do to help prevent falls. This information is not intended to replace advice given to you by your health care provider. Make sure you discuss any questions you have with your health care provider. Document Released: 10/12/2009 Document Revised: 05/23/2016 Document Reviewed: 01/20/2015 Elsevier Interactive Patient Education  2017 [employer].

## 2022-02-05 NOTE — Progress Notes (Signed)
Subjective:   Jeremy Rivera is a 71 y.o. male who presents for Medicare Annual/Subsequent preventive examination.  Review of Systems     Cardiac Risk Factors include: advanced age (>64mn, >>76women);diabetes mellitus;hypertension;dyslipidemia;male gender;sedentary lifestyle;obesity (BMI >30kg/m2)  IN OFFICE VISIT AT [facility].    Objective:    Today's Vitals   02/05/22 1349  BP: 138/82  Pulse: 82  SpO2: 99%  Weight: 247 lb (112 kg)  Height: _0  (1.803 m)   Body mass index is 34.45 kg/m.  Advanced Directives 02/05/2022 07/12/2015 01/11/2015 06/20/2014  Does Patient Have a Medical Advance Directive? No No No Patient does not have advance directive;Patient would like information  Would patient like information on creating a medical advance directive? No - Patient declined No - patient declined information No - patient declined information Advance directive packet given    Current Medications (verified) Outpatient Encounter Medications as of 02/05/2022  Medication Sig   Accu-Chek Softclix Lancets lancets TEST BLOOD SUGAR ONCE DAILY AS DIRECTED DX E11.9   amLODipine-benazepril (LOTREL) 10-40 MG capsule TAKE 1 CAPSULE BY MOUTH EVERY DAY   aspirin EC 81 MG tablet Take 81 mg by mouth daily.   blood glucose meter kit and supplies Dispense based on patient and insurance preference. Use up to four times daily as directed. (FOR ICD-10 E10.9, E11.9).   chlorthalidone (HYGROTON) 25 MG tablet Take 1 tablet (25 mg total) by mouth daily.   empagliflozin (JARDIANCE) 10 MG TABS tablet Take 1 tablet (10 mg total) by mouth daily before breakfast.   finasteride (PROSCAR) 5 MG tablet Take 1 tablet (5 mg total) by mouth daily.   glipiZIDE (GLUCOTROL) 5 MG tablet Take 2 tablets (10 mg total) by mouth 2 (two) times daily before a meal.   glucose blood (ONE TOUCH ULTRA TEST) test strip TEST BLOOD SUGAR ONCE DAILY AS DIRECTED **DX E11.9   metFORMIN (GLUCOPHAGE) 500 MG tablet Take 1 tablet (500 mg total) by  mouth 2 (two) times daily with a meal.   rosuvastatin (CRESTOR) 10 MG tablet Take 1 tablet (10 mg total) by mouth daily.   spironolactone (ALDACTONE) 25 MG tablet Take 1 tablet (25 mg total) by mouth daily.   triamcinolone cream (KENALOG) 0.1 % APPLY THIN LAYER TWICE DAILY TO RASH   No facility-administered encounter medications on file as of 02/05/2022.    Allergies (verified) Patient has no known allergies.   History: Past Medical History:  Diagnosis Date   Diabetes mellitus without complication (HCC)    Diastolic dysfunction    grade 2   ED (erectile dysfunction)    Herniated lumbar intervertebral disc    Hypertension    Irregular heart beat    a. Pt does not know official diagnosis.   Prostate hypertrophy    Past Surgical History:  Procedure Laterality Date   BACK SURGERY  1994 Dr. RConley RollsSURGERY  1994 Dr. RQuentin Cornwall  COLONOSCOPY     one polyp   LUMBAR LAMINECTOMY/DECOMPRESSION MICRODISCECTOMY Left 06/22/2014   Procedure: LUMBAR LAMINECTOMY/DECOMPRESSION MICRODISCECTOMY 1 LEVEL;  Surgeon: KWinfield Cunas MD;  [facility];  Service: Neurosurgery;  Laterality: Left;  Redo Left Lumbar Three-Four Microdiskectomy   Family History  Problem Relation Age of Onset   Hypertension Mother    Diabetes Mother    Transient ischemic attack Mother        Died of ministrokes at 749  Depression Father        Died of "grieving"  COPD Brother        Died of COPD   Throat cancer Sister        Died of throat CA   Colon cancer Neg Hx    Social History   Socioeconomic History   Marital status: Married    Spouse name: Delphine   Number of children: 3   Years of education: Not on file   Highest education level: Not on file  Occupational History   Not on file  Tobacco Use   Smoking status: Never   Smokeless tobacco: Never  Substance and Sexual Activity   Alcohol use: No    Alcohol/week: 0.0 standard drinks   Drug use: No   Sexual activity: Yes    Partners:  Female  Other Topics Concern   Not on file  Social History Narrative   Retired from [employer].   1 son, 2 daughters.   6 grandchildren.   Social Determinants of Health   Financial Resource Strain: Low Risk    Difficulty of Paying Living Expenses: Not very hard  Food Insecurity: No Food Insecurity   Worried About Charity fundraiser in the Last Year: Never true   Ran Out of Food in the Last Year: Never true  Transportation Needs: No Transportation Needs   Lack of Transportation (Medical): No   Lack of Transportation (Non-Medical): No  Physical Activity: Insufficiently Active   Days of Exercise per Week: 3 days   Minutes of Exercise per Session: 20 min  Stress: No Stress Concern Present   Feeling of Stress : Not at all  Social Connections: Socially Integrated   Frequency of Communication with Friends and Family: More than three times a week   Frequency of Social Gatherings with Friends and Family: More than three times a week   Attends Religious Services: More than 4 times per year   Active Member of [employer] or Organizations: Yes   Attends Music therapist: More than 4 times per year   Marital Status: Married    Tobacco Counseling Counseling given: Not Answered   Clinical Intake:  Pre-visit preparation completed: Yes  Pain : No/denies pain     BMI - recorded: 34.45 Nutritional Status: BMI > 30  Obese Nutritional Risks: None Diabetes: Yes  How often do you need to have someone help you when you read instructions, pamphlets, or other written materials from your doctor or pharmacy?: 1 - Never  Diabetic?Nutrition Risk Assessment:  Has the patient had any N/V/D within the last 2 months?  No  Does the patient have any non-healing wounds?  No  Has the patient had any unintentional weight loss or weight gain?  No   Diabetes:  Is the patient diabetic?  Yes  If diabetic, was a CBG obtained today?  No  Did the patient bring in their glucometer from home?  No    How often do you monitor your CBG's? Daily.   Financial Strains and Diabetes Management:  Are you having any financial strains with the device, your supplies or your medication? No .  Does the patient want to be seen by Chronic Care Management for management of their diabetes?  No  Would the patient like to be referred to a Nutritionist or for Diabetic Management?  No   Diabetic Exams:  Diabetic Eye Exam: Completed DUE. Overdue for diabetic eye exam. Pt has been advised about the importance in completing this exam. t.  Diabetic Foot Exam: Completed 06/19/2018. Pt has been advised about  the importance in completing this exam.  Interpreter Needed?: No  Information entered by :: mj Sandra Tellefsen, lpn   Activities of Daily Living In your present state of health, do you have any difficulty performing the following activities: 02/05/2022  Hearing? N  Vision? N  Difficulty concentrating or making decisions? Y  Comment Memory at times.  Walking or climbing stairs? N  Dressing or bathing? N  Doing errands, shopping? N  Preparing Food and eating ? N  Using the Toilet? N  In the past six months, have you accidently leaked urine? Y  Comment At times.  Do you have problems with loss of bowel control? N  Managing your Medications? N  Managing your Finances? N  Housekeeping or managing your Housekeeping? N  Some recent data might be hidden    Patient Care Team: Coral Spikes, DO as PCP - General (Family Medicine)  Indicate any recent Medical Services you may have received from other than [facility] providers in the past year (date may be approximate).     Assessment:   This is a routine wellness examination for [place].  Hearing/Vision screen Hearing Screening - Comments:: No hearing issues. Vision Screening - Comments:: Glasses. Dr. Radford Pax. 2020.  Dietary issues and exercise activities discussed: Current Exercise Habits: Home exercise routine, Type of exercise: walking, Time (Minutes): 20,  Frequency (Times/Week): 4, Weekly Exercise (Minutes/Week): 80, Intensity: Mild, Exercise limited by: cardiac condition(s)   Goals Addressed             This Visit's Progress    Exercise 3x per week (30 min per time)       Stay healthy, keep moving.        Depression Screen PHQ 2/9 Scores 02/05/2022 04/10/2021 05/12/2020 12/21/2018 12/19/2017 12/18/2016 06/17/2016  PHQ - 2 Score 0 0 0 0 0 0 0    Fall Risk Fall Risk  02/05/2022 04/10/2021 05/12/2020 12/21/2018 11/18/2018  Falls in the past year? 0 0 0 0 0  Comment - - - - Emmi Telephone Survey: data to providers prior to load  Number falls in past yr: 0 0 0 0 -  Injury with Fall? 0 0 0 0 -  Risk for fall due to : No Fall Risks No Fall Risks - History of fall(s) -  Follow up Falls prevention discussed Falls evaluation completed Falls evaluation completed Falls evaluation completed -    FALL RISK PREVENTION PERTAINING TO THE HOME:  Any stairs in or around the home? No  If so, are there any without handrails? No  Home free of loose throw rugs in walkways, pet beds, electrical cords, etc? Yes  Adequate lighting in your home to reduce risk of falls? Yes   ASSISTIVE DEVICES UTILIZED TO PREVENT FALLS:  Life alert? No  Use of a cane, walker or w/c? No  Grab bars in the bathroom? No  Shower chair or bench in shower? Yes  Elevated toilet seat or a handicapped toilet? Yes   TIMED UP AND GO:  Was the test performed? Yes .  Length of time to ambulate 10 feet: 10 sec.   Gait steady and fast without use of assistive device  Cognitive Function:     6CIT Screen 02/05/2022  What Year? 0 points  What month? 0 points  What time? 0 points  Count back from 20 0 points  Months in reverse 0 points  Repeat phrase 0 points  Total Score 0    Immunizations Immunization History  Administered Date(s) Administered  Fluad Quad(high Dose 65+) 09/06/2019, 09/12/2020   Influenza, High Dose Seasonal PF 09/24/2017, 09/30/2018, 09/27/2021    Influenza,inj,Quad PF,6+ Mos 10/03/2015   Influenza-Unspecified 09/30/2018   Moderna Sars-Covid-2 Vaccination 02/03/2020, 03/06/2020   PFIZER(Purple Top)SARS-COV-2 Vaccination 04/05/2021, 09/27/2021   Pneumococcal Conjugate-13 12/12/2014   Pneumococcal Polysaccharide-23 12/18/2015   Pneumococcal-Unspecified 12/29/2006   Zoster Recombinat (Shingrix) 12/21/2017    TDAP status: Due, Education has been provided regarding the importance of this vaccine. Advised may receive this vaccine at local pharmacy or Health Dept. Aware to provide a copy of the vaccination record if obtained from local pharmacy or Health Dept. Verbalized acceptance and understanding.  Flu Vaccine status: Up to date  Pneumococcal vaccine status: Up to date  Covid-19 vaccine status: Completed vaccines  Qualifies for Shingles Vaccine? Yes   Zostavax completed No   Shingrix Completed?: No.    Education has been provided regarding the importance of this vaccine. Patient has been advised to call insurance company to determine out of pocket expense if they have not yet received this vaccine. Advised may also receive vaccine at local pharmacy or Health Dept. Verbalized acceptance and understanding.  Screening Tests Health Maintenance  Topic Date Due   Hepatitis C Screening  Never done   TETANUS/TDAP  Never done   OPHTHALMOLOGY EXAM  03/30/2017   Zoster Vaccines- Shingrix (2 of 2) 02/15/2018   FOOT EXAM  06/20/2019   COVID-19 Vaccine (5 - Booster) 11/22/2021   HEMOGLOBIN A1C  07/21/2022   COLONOSCOPY (Pts 45-39yr Insurance coverage will need to be confirmed)  01/25/2025   Pneumonia Vaccine 71 Years old  Completed   INFLUENZA VACCINE  Completed   HPV VACCINES  Aged Out    Health Maintenance  Health Maintenance Due  Topic Date Due   Hepatitis C Screening  Never done   TETANUS/TDAP  Never done   OPHTHALMOLOGY EXAM  03/30/2017   Zoster Vaccines- Shingrix (2 of 2) 02/15/2018   FOOT EXAM  06/20/2019   COVID-19  Vaccine (5 - Booster) 11/22/2021    Colorectal cancer screening: Type of screening: Colonoscopy. Completed 01/25/2015. Repeat every 10 years  Lung Cancer Screening: (Low Dose CT Chest recommended if Age 401-80years, 30 pack-year currently smoking OR have quit w/in 15years.) does not qualify.    Additional Screening:  Hepatitis C Screening: does qualify; Completed DUE  Vision Screening: Recommended annual ophthalmology exams for early detection of glaucoma and other disorders of the eye. Is the patient up to date with their annual eye exam?  No  Who is the provider or what is the name of the office in which the patient attends annual eye exams? Dr. TLorel MonacoIf pt is not established with a provider, would they like to be referred to a provider to establish care? No .   Dental Screening: Recommended annual dental exams for proper oral hygiene  Community Resource Referral / Chronic Care Management: CRR required this visit?  No   CCM required this visit?  No      Plan:     I have personally reviewed and noted the following in the patients chart:   Medical and social history Use of alcohol, tobacco or illicit drugs  Current medications and supplements including opioid prescriptions. Patient is not currently taking opioid prescriptions. Functional ability and status Nutritional status Physical activity Advanced directives List of other physicians Hospitalizations, surgeries, and ER visits in previous 12 months Vitals Screenings to include cognitive, depression, and falls Referrals and appointments  In addition, I have reviewed  and discussed with patient certain preventive protocols, quality metrics, and best practice recommendations. A written personalized care plan for preventive services as well as general preventive health recommendations were provided to patient.     Chriss Driver, LPN   05/30/4829   Nurse Notes: Discussed eye exam. Pt states he will make an  appointment in the next month for exam. Discussed Shingrix and how to obtain.

## 2022-04-29 DIAGNOSIS — R972 Elevated prostate specific antigen [PSA]: Secondary | ICD-10-CM | POA: Diagnosis not present

## 2022-05-06 DIAGNOSIS — R972 Elevated prostate specific antigen [PSA]: Secondary | ICD-10-CM | POA: Diagnosis not present

## 2022-05-06 DIAGNOSIS — N400 Enlarged prostate without lower urinary tract symptoms: Secondary | ICD-10-CM | POA: Diagnosis not present

## 2022-08-23 ENCOUNTER — Encounter: Payer: Self-pay | Admitting: *Deleted

## 2022-08-23 ENCOUNTER — Telehealth: Payer: Self-pay | Admitting: *Deleted

## 2022-08-23 NOTE — Patient Outreach (Signed)
  Care Coordination   Initial Visit Note   08/23/2022 Name: CYRIL WOODMANSEE MRN: [id number] DOB: Sep 04, 1951  Dulcy Fanny Wrede is a 71 y.o. year old male who sees [place], [place], DO for primary care. I spoke with  Dulcy Fanny Stantz by phone today.  What matters to the patients health and wellness today?  Placed call to patient, reviewed [facility] care coordination program, patient agreeable to talk today but declined any further follow up.  Patient's concern today is "to stay healthy"    Goals Addressed               This Visit's Progress     "to stay healthy" (pt-stated)        Care Coordination Interventions: Patient interviewed about adult health maintenance status including  Hemoglobin A1c    Provided education about importance of taking medications as prescribed, checking CBG as prescribed, talk with primary care provider if any future needs for care coordination Patient had Annual Wellness Visit on 02/05/22         SDOH assessments and interventions completed:  Yes  SDOH Interventions Today    Flowsheet Row Most Recent Value  SDOH Interventions   Food Insecurity Interventions Intervention Not Indicated  Transportation Interventions Intervention Not Indicated        Care Coordination Interventions Activated:  Yes  Care Coordination Interventions:  Yes, provided   Follow up plan: No further intervention required.   Encounter Outcome:  Pt. Visit Completed   Irving Shows [facility], BSN RN Care Coordinator [phone]

## 2022-09-20 DIAGNOSIS — Z23 Encounter for immunization: Secondary | ICD-10-CM | POA: Diagnosis not present

## 2022-10-02 DIAGNOSIS — Z23 Encounter for immunization: Secondary | ICD-10-CM | POA: Diagnosis not present

## 2022-12-05 ENCOUNTER — Encounter: Payer: Self-pay | Admitting: Family Medicine

## 2022-12-05 ENCOUNTER — Ambulatory Visit (INDEPENDENT_AMBULATORY_CARE_PROVIDER_SITE_OTHER): Payer: Medicare Other | Admitting: Family Medicine

## 2022-12-05 DIAGNOSIS — I10 Essential (primary) hypertension: Secondary | ICD-10-CM | POA: Diagnosis not present

## 2022-12-05 DIAGNOSIS — E782 Mixed hyperlipidemia: Secondary | ICD-10-CM

## 2022-12-05 DIAGNOSIS — E119 Type 2 diabetes mellitus without complications: Secondary | ICD-10-CM | POA: Diagnosis not present

## 2022-12-05 DIAGNOSIS — I5042 Chronic combined systolic (congestive) and diastolic (congestive) heart failure: Secondary | ICD-10-CM | POA: Diagnosis not present

## 2022-12-05 MED ORDER — EMPAGLIFLOZIN 10 MG PO TABS: 10.0000 mg | ORAL_TABLET | Freq: Every day | ORAL | 3 refills | Status: DC

## 2022-12-05 MED ORDER — CHLORTHALIDONE 25 MG PO TABS: 25.0000 mg | ORAL_TABLET | Freq: Every day | ORAL | 3 refills | Status: DC

## 2022-12-05 NOTE — Patient Instructions (Addendum)
Labs and urine today at [facility].  Referral placed to cardiology.  Please be sure to get eyes checked.  Follow up in 6 months.

## 2022-12-05 NOTE — Assessment & Plan Note (Signed)
BP well controlled.  Continue current medications.

## 2022-12-05 NOTE — Assessment & Plan Note (Signed)
Lipid panel today to reassess.  Continue Crestor. 

## 2022-12-05 NOTE — Assessment & Plan Note (Signed)
Needs echocardiogram.  Needs to see cardiology.  Referral placed today.

## 2022-12-05 NOTE — Progress Notes (Signed)
Subjective:  Patient ID: Jeremy Rivera, male    DOB: 1951-05-30  Age: 71 y.o. MRN: [id number]  CC: Follow-up  HPI:  71 year old male with CHF, hypertension, type 2 diabetes, hyperlipidemia presents for an exam.    I previously ordered an echocardiogram but this has not been done.  Patient has a history of CHF.  Has not seen a cardiologist in many years.  Needs to see cardiology.  Will discuss today.  He is currently on Jardiance, ACEi, and spironolactone.  Blood pressure is well-controlled on amlodipine/benazepril, chlorthalidone, spironolactone.  Patient's lipids have been uncontrolled.  He is on statin now.  Needs reassessment of lipids.  Patient's type 2 diabetes has not been at goal.  He states that his blood sugar readings are typically under 130.  I previously added Jardiance.  He is on metformin as well.  Needs A1c today.  Needs foot exam today.  Patient is also overdue for diabetic eye exam.  He states that he has an eye doctor.  He does not want referral.  Patient Active Problem List   Diagnosis Date Noted   Type 2 diabetes mellitus (HCC) 01/21/2022   Family history of prostate cancer 04/10/2021   Chronic combined systolic and diastolic CHF, NYHA class 2 (HCC) 07/14/2015   Essential hypertension, benign 07/25/2013   Mixed hyperlipidemia 07/25/2013   Erectile dysfunction 07/25/2013    Social Hx   Social History   Socioeconomic History   Marital status: Married    Spouse name: Delphine   Number of children: 3   Years of education: Not on file   Highest education level: Not on file  Occupational History   Not on file  Tobacco Use   Smoking status: Never   Smokeless tobacco: Never  Substance and Sexual Activity   Alcohol use: No    Alcohol/week: 0.0 standard drinks of alcohol   Drug use: No   Sexual activity: Yes    Partners: Female  Other Topics Concern   Not on file  Social History Narrative   Retired from [employer].   1 son, 2 daughters.   6  grandchildren.   Social Determinants of Health   Financial Resource Strain: Low Risk  (02/05/2022)   Overall Financial Resource Strain (CARDIA)    Difficulty of Paying Living Expenses: Not very hard  Food Insecurity: No Food Insecurity (08/23/2022)   Hunger Vital Sign    Worried About Running Out of Food in the Last Year: Never true    Ran Out of Food in the Last Year: Never true  Transportation Needs: No Transportation Needs (08/23/2022)   PRAPARE - Administrator, Civil Service (Medical): No    Lack of Transportation (Non-Medical): No  Physical Activity: Insufficiently Active (02/05/2022)   Exercise Vital Sign    Days of Exercise per Week: 3 days    Minutes of Exercise per Session: 20 min  Stress: No Stress Concern Present (02/05/2022)   [employer] of Occupational Health - Occupational Stress Questionnaire    Feeling of Stress : Not at all  Social Connections: Socially Integrated (02/05/2022)   Social Connection and Isolation Panel [NHANES]    Frequency of Communication with Friends and Family: More than three times a week    Frequency of Social Gatherings with Friends and Family: More than three times a week    Attends Religious Services: More than 4 times per year    Active Member of [employer] or Organizations: Yes    Attends Club or  Organization Meetings: More than 4 times per year    Marital Status: Married    Review of Systems  Respiratory: Negative.    Cardiovascular:  Negative for chest pain.   Objective:  BP 108/67   Pulse 91   Ht 5\' 11"  (1.803 m)   Wt 240 lb (108.9 kg)   SpO2 99%   BMI 33.47 kg/m      12/05/2022    9:07 AM 02/05/2022    1:49 PM 01/21/2022   10:27 AM  BP/Weight  Systolic BP 108 138 140  Diastolic BP 67 82 82  Wt. (Lbs) 240 247 249.8  BMI 33.47 kg/m2 34.45 kg/m2 36.89 kg/m2    Physical Exam Constitutional:      General: He is not in acute distress.    Appearance: Normal appearance. He is obese.  Eyes:     General:        Right  eye: No discharge.        Left eye: No discharge.     Conjunctiva/sclera: Conjunctivae normal.  Cardiovascular:     Rate and Rhythm: Normal rate and regular rhythm.  Pulmonary:     Effort: Pulmonary effort is normal.     Breath sounds: Normal breath sounds. No wheezing, rhonchi or rales.  Abdominal:     Palpations: Abdomen is soft.     Tenderness: There is no abdominal tenderness.  Neurological:     Mental Status: He is alert.  Psychiatric:        Mood and Affect: Mood normal.        Behavior: Behavior normal.   Diabetic Foot Check -  Appearance - no lesions, ulcers or calluses Skin - no unusual pallor or redness Monofilament testing -  Right - Great toe, medial, central, lateral ball and posterior foot intact Left - Great toe, medial, central, lateral ball and posterior foot intact  Lab Results  Component Value Date   WBC 8.2 01/21/2022   HGB 14.2 01/21/2022   HCT 43.5 01/21/2022   PLT 237 01/21/2022   GLUCOSE 181 (H) 01/21/2022   CHOL 213 (H) 01/21/2022   TRIG 189 (H) 01/21/2022   HDL 41 01/21/2022   LDLCALC 138 (H) 01/21/2022   ALT 11 01/21/2022   AST 11 01/21/2022   NA 134 01/21/2022   K 4.6 01/21/2022   CL 96 01/21/2022   CREATININE 1.30 (H) 01/21/2022   BUN 16 01/21/2022   CO2 25 01/21/2022   TSH 1.382 07/12/2015   PSA 6.47 (H) 04/05/2014   HGBA1C 7.8 (H) 01/21/2022   MICROALBUR 3.2 (H) 01/03/2015     Assessment & Plan:   Problem List Items Addressed This Visit       Cardiovascular and Mediastinum   Chronic combined systolic and diastolic CHF, NYHA class 2 (HCC)    Needs echocardiogram.  Needs to see cardiology.  Referral placed today.      Relevant Medications   chlorthalidone (HYGROTON) 25 MG tablet   Other Relevant Orders   Ambulatory referral to Cardiology   Essential hypertension, benign    BP well-controlled.  Continue current medications.      Relevant Medications   chlorthalidone (HYGROTON) 25 MG tablet     Endocrine   Type 2  diabetes mellitus (HCC)    A1c today to reassess.  Continue Jardiance and metformin.      Relevant Medications   empagliflozin (JARDIANCE) 10 MG TABS tablet   Other Relevant Orders   Basic metabolic panel   Hemoglobin A1c  Microalbumin / creatinine urine ratio     Other   Mixed hyperlipidemia    Lipid panel today to reassess.  Continue Crestor.      Relevant Medications   chlorthalidone (HYGROTON) 25 MG tablet   Other Relevant Orders   Lipid panel    Follow-up:  Return in about 6 months (around 06/06/2023).  Everlene Other DO [facility]

## 2022-12-05 NOTE — Assessment & Plan Note (Signed)
A1c today to reassess.  Continue Jardiance and metformin.

## 2022-12-08 LAB — BASIC METABOLIC PANEL
BUN/Creatinine Ratio: 19 (ref 10–24)
BUN: 28 mg/dL — ABNORMAL HIGH (ref 8–27)
CO2: 24 mmol/L (ref 20–29)
Calcium: 9.7 mg/dL (ref 8.6–10.2)
Chloride: 96 mmol/L (ref 96–106)
Creatinine, Ser: 1.47 mg/dL — ABNORMAL HIGH (ref 0.76–1.27)
Glucose: 245 mg/dL — ABNORMAL HIGH (ref 70–99)
Potassium: 4.6 mmol/L (ref 3.5–5.2)
Sodium: 136 mmol/L (ref 134–144)
eGFR: 50 mL/min/{1.73_m2} — ABNORMAL LOW (ref >59)

## 2022-12-08 LAB — HEMOGLOBIN A1C
Est. average glucose Bld gHb Est-mCnc: 203 mg/dL
Hgb A1c MFr Bld: 8.7 % — ABNORMAL HIGH (ref 4.8–5.6)

## 2022-12-08 LAB — LIPID PANEL
Chol/HDL Ratio: 3.5 ratio (ref 0.0–5.0)
Cholesterol, Total: 133 mg/dL (ref 100–199)
HDL: 38 mg/dL — ABNORMAL LOW (ref >39)
LDL Chol Calc (NIH): 66 mg/dL (ref 0–99)
Triglycerides: 173 mg/dL — ABNORMAL HIGH (ref 0–149)
VLDL Cholesterol Cal: 29 mg/dL (ref 5–40)

## 2022-12-08 LAB — MICROALBUMIN / CREATININE URINE RATIO
Creatinine, Urine: 161.7 mg/dL
Microalb/Creat Ratio: 9 mg/g creat (ref 0–29)
Microalbumin, Urine: 14 ug/mL

## 2022-12-11 ENCOUNTER — Other Ambulatory Visit: Payer: Self-pay

## 2022-12-11 DIAGNOSIS — E119 Type 2 diabetes mellitus without complications: Secondary | ICD-10-CM

## 2022-12-11 DIAGNOSIS — N289 Disorder of kidney and ureter, unspecified: Secondary | ICD-10-CM

## 2022-12-12 ENCOUNTER — Other Ambulatory Visit: Payer: Self-pay | Admitting: Family Medicine

## 2022-12-12 MED ORDER — RYBELSUS 7 MG PO TABS: 7.0000 mg | ORAL_TABLET | Freq: Every day | ORAL | 0 refills | Status: DC

## 2022-12-12 MED ORDER — RYBELSUS 3 MG PO TABS: 3.0000 mg | ORAL_TABLET | Freq: Every day | ORAL | 0 refills | Status: DC

## 2022-12-27 ENCOUNTER — Telehealth: Payer: Self-pay | Admitting: Family Medicine

## 2022-12-27 ENCOUNTER — Other Ambulatory Visit: Payer: Self-pay | Admitting: Family Medicine

## 2022-12-27 MED ORDER — NIRMATRELVIR/RITONAVIR (PAXLOVID) TABLET (RENAL DOSING): ORAL_TABLET | ORAL | 0 refills | Status: DC

## 2022-12-27 NOTE — Telephone Encounter (Signed)
Patient tested positive this morning for Covid only has hoarseness and cough no fever ,no other symptoms. [facility]

## 2022-12-27 NOTE — Telephone Encounter (Signed)
Left message to return call 

## 2022-12-27 NOTE — Telephone Encounter (Signed)
Patient notified and stated he has already picked up med and took the first dose Warning signs discussed -patient verbalized understanding.

## 2022-12-27 NOTE — Telephone Encounter (Signed)
Cook, Jayce G, DO   ? ?Rx sent.   ? ?

## 2023-01-26 ENCOUNTER — Other Ambulatory Visit: Payer: Self-pay | Admitting: Family Medicine

## 2023-01-27 ENCOUNTER — Ambulatory Visit: Payer: Medicare Other | Attending: Cardiology | Admitting: Cardiology

## 2023-01-27 ENCOUNTER — Encounter: Payer: Self-pay | Admitting: Cardiology

## 2023-01-27 VITALS — BP 114/76 | HR 83 | Ht 71.0 in | Wt 238.6 lb

## 2023-01-27 DIAGNOSIS — I5022 Chronic systolic (congestive) heart failure: Secondary | ICD-10-CM

## 2023-01-27 DIAGNOSIS — I10 Essential (primary) hypertension: Secondary | ICD-10-CM

## 2023-01-27 NOTE — Progress Notes (Signed)
Clinical Summary Jeremy Rivera is a 72 y.o.male last seen in our office in 2017, seen today as a new patient for the following medical problems.    1. Chronic systolic HF - echo 0/8144 LVEF 40%, grade II diastolic dysfunction.   - echo Jan 2017 LVEF 81-85%, grade I diastolic dysfunction - 05/3148 nuclear stress moderate inferior defect probable attenuation, cannot rule out scar. No ischemia.      - no SOB/DOE. No LE edema - compliant with meds - not taking jardiance due to cost.  - appears no longer taking coreg, unclear what happened to it. Was on at our last visit in 2017     2. HTN - he is compliant with meds - home bp's 120s/70s   3. OSA  -10/2016 severe OSA by sleep study   4. Hyperlipidmia -11/2022 TC 133 TG 173 HDL 38 LDL 66 - he is on rosuvatatin 10mg     Past Medical History:  Diagnosis Date   Diabetes mellitus without complication (HCC)    Diastolic dysfunction    grade 2   ED (erectile dysfunction)    Herniated lumbar intervertebral disc    Hypertension    Irregular heart beat    a. Pt does not know official diagnosis.   Prostate hypertrophy      No Known Allergies   Current Outpatient Medications  Medication Sig Dispense Refill   Accu-Chek Softclix Lancets lancets TEST BLOOD SUGAR ONCE DAILY AS DIRECTED DX E11.9 100 each 5   amLODipine-benazepril (LOTREL) 10-40 MG capsule TAKE 1 CAPSULE BY MOUTH EVERY DAY 90 capsule 3   aspirin EC 81 MG tablet Take 81 mg by mouth daily.     blood glucose meter kit and supplies Dispense based on patient and insurance preference. Use up to four times daily as directed. (FOR ICD-10 E10.9, E11.9). 1 each 0   chlorthalidone (HYGROTON) 25 MG tablet Take 1 tablet (25 mg total) by mouth daily. 90 tablet 3   empagliflozin (JARDIANCE) 10 MG TABS tablet Take 1 tablet (10 mg total) by mouth daily. 90 tablet 3   finasteride (PROSCAR) 5 MG tablet Take 1 tablet (5 mg total) by mouth daily. 90 tablet 3   glipiZIDE (GLUCOTROL) 5 MG  tablet Take 2 tablets (10 mg total) by mouth 2 (two) times daily before a meal. 360 tablet 3   glucose blood (ONE TOUCH ULTRA TEST) test strip TEST BLOOD SUGAR ONCE DAILY AS DIRECTED **DX E11.9 50 each 01   metFORMIN (GLUCOPHAGE) 500 MG tablet Take 1 tablet (500 mg total) by mouth 2 (two) times daily with a meal. 180 tablet 3   nirmatrelvir/ritonavir, renal dosing, (PAXLOVID) 10 x 150 MG & 10 x 100MG  TABS Take nirmatrelvir 150 mg one tablet twice daily for 5 days and ritonavir 100 mg one tablet twice daily for 5 days. Patient GFR is 50. 20 tablet 0   rosuvastatin (CRESTOR) 10 MG tablet Take 1 tablet (10 mg total) by mouth daily. 90 tablet 3   Semaglutide (RYBELSUS) 3 MG TABS Take 3 mg by mouth daily. After 1 month, start 7 mg dose. 30 tablet 0   Semaglutide (RYBELSUS) 7 MG TABS Take 7 mg by mouth daily. 30 tablet 0   spironolactone (ALDACTONE) 25 MG tablet Take 1 tablet (25 mg total) by mouth daily. 90 tablet 3   triamcinolone cream (KENALOG) 0.1 % APPLY THIN LAYER TWICE DAILY TO RASH 60 g 0   No current facility-administered medications for this visit.  Past Surgical History:  Procedure Laterality Date   BACK SURGERY  1994 Dr. Conley Rolls SURGERY  1994 Dr. Quentin Cornwall   COLONOSCOPY     one polyp   LUMBAR LAMINECTOMY/DECOMPRESSION MICRODISCECTOMY Left 06/22/2014   Procedure: LUMBAR LAMINECTOMY/DECOMPRESSION MICRODISCECTOMY 1 LEVEL;  Surgeon: Winfield Cunas, MD;  Location: [facility];  Service: Neurosurgery;  Laterality: Left;  Redo Left Lumbar Three-Four Microdiskectomy     No Known Allergies    Family History  Problem Relation Age of Onset   Hypertension Mother    Diabetes Mother    Transient ischemic attack Mother        Died of ministrokes at 12   Depression Father        Died of "grieving"   COPD Brother        Died of COPD   Throat cancer Sister        Died of throat CA   Colon cancer Neg Hx      Social History Jeremy Rivera reports that he has never smoked. He  has never used smokeless tobacco. Jeremy Rivera reports no history of alcohol use.   Review of Systems CONSTITUTIONAL: No weight loss, fever, chills, weakness or fatigue.  HEENT: Eyes: No visual loss, blurred vision, double vision or yellow sclerae.No hearing loss, sneezing, congestion, runny nose or sore throat.  SKIN: No rash or itching.  CARDIOVASCULAR: per hpi RESPIRATORY: No shortness of breath, cough or sputum.  GASTROINTESTINAL: No anorexia, nausea, vomiting or diarrhea. No abdominal pain or blood.  GENITOURINARY: No burning on urination, no polyuria NEUROLOGICAL: No headache, dizziness, syncope, paralysis, ataxia, numbness or tingling in the extremities. No change in bowel or bladder control.  MUSCULOSKELETAL: No muscle, back pain, joint pain or stiffness.  LYMPHATICS: No enlarged nodes. No history of splenectomy.  PSYCHIATRIC: No history of depression or anxiety.  ENDOCRINOLOGIC: No reports of sweating, cold or heat intolerance. No polyuria or polydipsia.  Marland Kitchen   Physical Examination Today's Vitals   01/27/23 0856  BP: 114/76  Pulse: 83  SpO2: 98%  Weight: 238 lb 9.6 oz (108.2 kg)  Height: 5\' 11"  (1.803 m)   Body mass index is 33.28 kg/m.  Gen: resting comfortably, no acute distress HEENT: no scleral icterus, pupils equal round and reactive, no palptable cervical adenopathy,  CV: RRR, no m/r/g no jvd Resp: Clear to auscultation bilaterally GI: abdomen is soft, non-tender, non-distended, normal bowel sounds, no hepatosplenomegaly MSK: extremities are warm, no edema.  Skin: warm, no rash Neuro:  no focal deficits Psych: appropriate affect   Diagnostic Studies  06/2015 echo Study Conclusions  - Left ventricle: The cavity size was normal. Wall thickness was   increased in a pattern of mild LVH. Systolic function was mildly   to moderately reduced. The estimated ejection fraction was = 40%.   No regional wall motion abnormalities. Features are consistent   with a  pseudonormal left ventricular filling pattern, with   concomitant abnormal relaxation and increased filling pressure   (grade 2 diastolic dysfunction). - Aortic valve: Mildly calcified annulus. Trileaflet; mildly   thickened leaflets. Valve area (VTI): 2.41 cm^2. Valve area   (Vmax): 2.49 cm^2. - Mitral valve: There was mild regurgitation. - Left atrium: The atrium was moderately dilated. - Pulmonary veins: There is blunting of systolic pulmonary vein   flow consistent with elevated LA pressures. - Atrial septum: No defect or patent foramen ovale was identified. - Technically difficult study. Echo contrast was used to enhance   visualization.  Jan 2017 echo Study Conclusions  - Left ventricle: The cavity size was normal. Wall thickness was   increased in a pattern of mild LVH. Systolic function was mildly   to moderately reduced. The estimated ejection fraction was in the   range of 40% to 45%. Diffuse hypokinesis. Doppler parameters are   consistent with abnormal left ventricular relaxation (grade 1   diastolic dysfunction). Normal filling pressures. - Aortic valve: Mildly calcified annulus. Trileaflet; mildly   calcified leaflets. - Mitral valve: There was trivial regurgitation. - Left atrium: The atrium was mildly dilated. - Right atrium: Central venous pressure (est): 3 mm Hg. - Tricuspid valve: There was trivial regurgitation. - Pulmonary arteries: Systolic pressure could not be accurately   estimated. - Pericardium, extracardiac: There was no pericardial effusion.  Impressions:  - Mild LVH with LVEF approximately 40-45%, diffuse hypokinesis with   regional variation. Grade 1 diastolic dysfunction with normal   estimated filling pressures. Mild left atrial enlargement.   Trivial mitral regurgitation. Mildly sclerotic aortic valve.   Trivial tricuspid regurgitation, PASP not estimated.   02/2016 Exercise MPI Equivocal ST segment abnormalities noted in leads II, III, aVF,  and V4 through V6 in the absence of chest pain. Suboptimal heart rate response achieved on treadmill, Lexiscan utilized. Occasional to frequent PVCs. Blood pressure demonstrated a hypertensive response to exercise. Moderate-sized, moderate intensity, inferior and basal inferoseptal defect that is fixed and most consistent with soft tissue attenuation, although scar is also possible. This is a high risk study based on calculated LVEF. Nuclear stress EF: 28%.   Assessment and Plan   1. Chronic systolic HF - LVEF 40%, NYHA II. - noninvasive ischemic testing without clear evidence of ICM - no recent symptoms - last seen in 2017, we will repeat echo - jardiance too expensive. Off coreg I am unclear on this history, he is not aware of indications.  - f/u echo, pending results consider adding back beta blocker, possibly changing to entresto.      2. HTN - at goal, continue current meds       Antoine Poche, M.D

## 2023-01-27 NOTE — Patient Instructions (Signed)
Medication Instructions:  Continue all current medications.   Labwork: none  Testing/Procedures: Your physician has requested that you have an echocardiogram. Echocardiography is a painless test that uses sound waves to create images of your heart. It provides your doctor with information about the size and shape of your heart and how well your heart's chambers and valves are working. This procedure takes approximately one hour. There are no restrictions for this procedure. Please do NOT wear cologne, perfume, aftershave, or lotions (deodorant is allowed). Please arrive 15 minutes prior to your appointment time. Office will contact with results via phone, letter or mychart.     Follow-Up: 6 months   Any Other Special Instructions Will Be Listed Below (If Applicable).   If you need a refill on your cardiac medications before your next appointment, please call your pharmacy.

## 2023-02-12 ENCOUNTER — Ambulatory Visit: Payer: Medicare Other | Attending: Cardiology

## 2023-02-12 ENCOUNTER — Telehealth: Payer: Self-pay | Admitting: Family Medicine

## 2023-02-12 DIAGNOSIS — I5022 Chronic systolic (congestive) heart failure: Secondary | ICD-10-CM | POA: Insufficient documentation

## 2023-02-12 LAB — ECHOCARDIOGRAM COMPLETE
AR max vel: 1.78 cm2
AV Peak grad: 10.8 mmHg
Ao pk vel: 1.65 m/s
Area-P 1/2: 8.92 cm2
Calc EF: 48.9 %
Est EF: 45
MV M vel: 2.41 m/s
MV Peak grad: 23.1 mmHg
S' Lateral: 3.2 cm
Single Plane A2C EF: 49.9 %
Single Plane A4C EF: 47.4 %

## 2023-02-12 MED ORDER — PERFLUTREN LIPID MICROSPHERE
1.0000 mL | INTRAVENOUS | Status: AC | PRN
  Administered 2023-02-12: 2 mL via INTRAVENOUS

## 2023-02-12 NOTE — Telephone Encounter (Signed)
Left message for patient to call back and schedule Medicare Annual Wellness Visit (AWV) in office.   If unable to come into the office for AWV,  please offer to do virtually or by telephone.  Last AWV: 02/05/2022  Please schedule at anytime with [facility].  30 minute appointment   Any questions, please contact me at [phone]   Thank you,   [facility]  Ambulatory Clinical Support for [facility] ??[id number] or ??[id number]

## 2023-02-12 NOTE — Telephone Encounter (Signed)
Error

## 2023-02-28 ENCOUNTER — Ambulatory Visit: Payer: Medicare Other

## 2023-02-28 VITALS — Ht 71.0 in | Wt 240.0 lb

## 2023-02-28 DIAGNOSIS — Z0000 Encounter for general adult medical examination without abnormal findings: Secondary | ICD-10-CM

## 2023-02-28 NOTE — Progress Notes (Signed)
Subjective:   Jeremy Rivera is a 72 y.o. male who presents for Medicare Annual/Subsequent preventive examination.  I connected with  Austan E Dillard on 02/28/23 by a audio enabled telemedicine application and verified that I am speaking with the correct person using two identifiers.  Patient Location: Home  Provider Location: Office/Clinic  I discussed the limitations of evaluation and management by telemedicine. The patient expressed understanding and agreed to proceed.  Review of Systems     Cardiac Risk Factors include: advanced age (>29mn, >>77women);diabetes mellitus;dyslipidemia;hypertension;male gender     Objective:    Today's Vitals   02/28/23 1706  Weight: 240 lb (108.9 kg)  Height: '5\' 11"'$  (1.803 m)   Body mass index is 33.47 kg/m.     02/28/2023    5:09 PM 02/05/2022    2:03 PM 07/12/2015    6:06 PM 01/11/2015   11:09 AM 06/20/2014    3:21 PM  Advanced Directives  Does Patient Have a Medical Advance Directive? No No No No Patient does not have advance directive;Patient would like information  Would patient like information on creating a medical advance directive? No - Patient declined No - Patient declined No - patient declined information No - patient declined information Advance directive packet given    Current Medications (verified) Outpatient Encounter Medications as of 02/28/2023  Medication Sig   Accu-Chek Softclix Lancets lancets TEST BLOOD SUGAR ONCE DAILY AS DIRECTED DX E11.9   amLODipine-benazepril (LOTREL) 10-40 MG capsule TAKE 1 CAPSULE BY MOUTH EVERY DAY   aspirin EC 81 MG tablet Take 81 mg by mouth daily.   blood glucose meter kit and supplies Dispense based on patient and insurance preference. Use up to four times daily as directed. (FOR ICD-10 E10.9, E11.9).   chlorthalidone (HYGROTON) 25 MG tablet Take 1 tablet (25 mg total) by mouth daily.   empagliflozin (JARDIANCE) 10 MG TABS tablet Take 1 tablet (10 mg total) by mouth daily.   finasteride  (PROSCAR) 5 MG tablet Take 1 tablet (5 mg total) by mouth daily.   glipiZIDE (GLUCOTROL) 5 MG tablet Take 2 tablets (10 mg total) by mouth 2 (two) times daily before a meal.   glucose blood (ONE TOUCH ULTRA TEST) test strip TEST BLOOD SUGAR ONCE DAILY AS DIRECTED **DX E11.9   metFORMIN (GLUCOPHAGE) 500 MG tablet Take 1 tablet (500 mg total) by mouth 2 (two) times daily with a meal.   rosuvastatin (CRESTOR) 10 MG tablet TAKE 1 TABLET BY MOUTH EVERY DAY   spironolactone (ALDACTONE) 25 MG tablet Take 1 tablet (25 mg total) by mouth daily.   triamcinolone cream (KENALOG) 0.1 % APPLY THIN LAYER TWICE DAILY TO RASH   Semaglutide (RYBELSUS) 3 MG TABS Take 3 mg by mouth daily. After 1 month, start 7 mg dose. (Patient not taking: Reported on 02/28/2023)   Semaglutide (RYBELSUS) 7 MG TABS Take 7 mg by mouth daily. (Patient not taking: Reported on 02/28/2023)   [DISCONTINUED] nirmatrelvir/ritonavir, renal dosing, (PAXLOVID) 10 x 150 MG & 10 x '100MG'$  TABS Take nirmatrelvir 150 mg one tablet twice daily for 5 days and ritonavir 100 mg one tablet twice daily for 5 days. Patient GFR is 50.   No facility-administered encounter medications on file as of 02/28/2023.    Allergies (verified) Patient has no known allergies.   History: Past Medical History:  Diagnosis Date   Diabetes mellitus without complication (HCC)    Diastolic dysfunction    grade 2   ED (erectile dysfunction)  Herniated lumbar intervertebral disc    Hypertension    Irregular heart beat    a. Pt does not know official diagnosis.   Prostate hypertrophy    Past Surgical History:  Procedure Laterality Date   BACK SURGERY  1994 Dr. Conley Rolls SURGERY  1994 Dr. Quentin Cornwall   COLONOSCOPY     one polyp   LUMBAR LAMINECTOMY/DECOMPRESSION MICRODISCECTOMY Left 06/22/2014   Procedure: LUMBAR LAMINECTOMY/DECOMPRESSION MICRODISCECTOMY 1 LEVEL;  Surgeon: Winfield Cunas, MD;  Location: [facility];  Service: Neurosurgery;  Laterality: Left;  Redo  Left Lumbar Three-Four Microdiskectomy   Family History  Problem Relation Age of Onset   Hypertension Mother    Diabetes Mother    Transient ischemic attack Mother        Died of ministrokes at 57   Depression Father        Died of "grieving"   COPD Brother        Died of COPD   Throat cancer Sister        Died of throat CA   Colon cancer Neg Hx    Social History   Socioeconomic History   Marital status: Married    Spouse name: Delphine   Number of children: 3   Years of education: Not on file   Highest education level: Not on file  Occupational History   Not on file  Tobacco Use   Smoking status: Never   Smokeless tobacco: Never  Substance and Sexual Activity   Alcohol use: No    Alcohol/week: 0.0 standard drinks of alcohol   Drug use: No   Sexual activity: Yes    Partners: Female  Other Topics Concern   Not on file  Social History Narrative   Retired from [employer].   1 son, 2 daughters.   6 grandchildren.   Social Determinants of Health   Financial Resource Strain: Low Risk  (02/28/2023)   Overall Financial Resource Strain (CARDIA)    Difficulty of Paying Living Expenses: Not hard at all  Food Insecurity: No Food Insecurity (02/28/2023)   Hunger Vital Sign    Worried About Running Out of Food in the Last Year: Never true    Ran Out of Food in the Last Year: Never true  Transportation Needs: No Transportation Needs (02/28/2023)   PRAPARE - Hydrologist (Medical): No    Lack of Transportation (Non-Medical): No  Physical Activity: Insufficiently Active (02/28/2023)   Exercise Vital Sign    Days of Exercise per Week: 3 days    Minutes of Exercise per Session: 30 min  Stress: No Stress Concern Present (02/28/2023)   [place]    Feeling of Stress : Not at all  Social Connections: [place] (02/28/2023)   Social Connection and Isolation Panel [NHANES]    Frequency of  Communication with Friends and Family: More than three times a week    Frequency of Social Gatherings with Friends and Family: More than three times a week    Attends Religious Services: More than 4 times per year    Active Member of [employer] or Organizations: Yes    Attends Music therapist: More than 4 times per year    Marital Status: Married    Tobacco Counseling Counseling given: Not Answered   Clinical Intake:  Pre-visit preparation completed: Yes  Pain : No/denies pain  Diabetes: Yes CBG done?: No Did pt. bring in CBG  monitor from home?: No  How often do you need to have someone help you when you read instructions, pamphlets, or other written materials from your doctor or pharmacy?: 1 - Never  Diabetic?Yes   Nutrition Risk Assessment:  Has the patient had any N/V/D within the last 2 months?  No  Does the patient have any non-healing wounds?  No  Has the patient had any unintentional weight loss or weight gain?  No   Diabetes:  Is the patient diabetic?  Yes  If diabetic, was a CBG obtained today?  No  Did the patient bring in their glucometer from home?  No  How often do you monitor your CBG's? Daily .   Financial Strains and Diabetes Management:  Are you having any financial strains with the device, your supplies or your medication? No .  Does the patient want to be seen by Chronic Care Management for management of their diabetes?  No  Would the patient like to be referred to a Nutritionist or for Diabetic Management?  No   Diabetic Exams:  Diabetic Eye Exam: Overdue for diabetic eye exam. Pt has been advised about the importance in completing this exam. Patient advised to call and schedule an eye exam. Diabetic Foot Exam: Completed 12/05/22   Interpreter Needed?: No  Information entered by :: Denman George LPN   Activities of Daily Living    02/28/2023    5:09 PM  In your present state of health, do you have any difficulty performing the  following activities:  Hearing? 0  Vision? 0  Difficulty concentrating or making decisions? 0  Walking or climbing stairs? 0  Dressing or bathing? 0  Doing errands, shopping? 0  Preparing Food and eating ? N  Using the Toilet? N  In the past six months, have you accidently leaked urine? N  Do you have problems with loss of bowel control? N  Managing your Medications? N  Managing your Finances? N  Housekeeping or managing your Housekeeping? N    Patient Care Team: Coral Spikes, DO as PCP - General (Family Medicine) Harl Bowie Alphonse Guild, MD as Consulting Physician (Cardiology) [facility], [facility]  Indicate any recent Medical Services you may have received from other than [facility] providers in the past year (date may be approximate).     Assessment:   This is a routine wellness examination for [place].  Hearing/Vision screen Hearing Screening - Comments:: Denies hearing difficulties  Vision Screening - Comments:: Wears rx glasses - will schedule diabetic eye exam soon   Dietary issues and exercise activities discussed: Current Exercise Habits: Home exercise routine, Type of exercise: walking, Time (Minutes): 30, Frequency (Times/Week): 3, Weekly Exercise (Minutes/Week): 90, Intensity: Mild   Goals Addressed             This Visit's Progress    Exercise 3x per week (30 min per time)   On track    Stay healthy, keep moving.        Depression Screen    02/28/2023    5:08 PM 12/05/2022    9:02 AM 02/05/2022    1:59 PM 04/10/2021    9:07 AM 05/12/2020   10:06 AM 12/21/2018    9:29 AM 12/19/2017    9:23 AM  PHQ 2/9 Scores  PHQ - 2 Score 0 0 0 0 0 0 0    Fall Risk    02/28/2023    5:07 PM 12/05/2022    9:02 AM 02/05/2022    2:03 PM  04/10/2021    9:07 AM 05/12/2020   10:06 AM  Fall Risk   Falls in the past year? 0 0 0 0 0  Number falls in past yr: 0 0 0 0 0  Injury with Fall? 0 0 0 0 0  Risk for fall due to : No Fall Risks No Fall Risks No Fall Risks No Fall  Risks   Follow up Falls prevention discussed Falls evaluation completed Falls prevention discussed Falls evaluation completed Falls evaluation completed    [place]:  Any stairs in or around the home? No  If so, are there any without handrails? No  Home free of loose throw rugs in walkways, pet beds, electrical cords, etc? Yes  Adequate lighting in your home to reduce risk of falls? Yes   ASSISTIVE DEVICES UTILIZED TO PREVENT FALLS:  Life alert? No  Use of a cane, walker or w/c? No  Grab bars in the bathroom? Yes  Shower chair or bench in shower? No  Elevated toilet seat or a handicapped toilet? Yes   TIMED UP AND GO:  Was the test performed? No . Telephonic visit   Cognitive Function:        02/28/2023    5:12 PM 02/05/2022    2:06 PM  6CIT Screen  What Year? 0 points 0 points  What month? 0 points 0 points  What time? 0 points 0 points  Count back from 20 0 points 0 points  Months in reverse 0 points 0 points  Repeat phrase 0 points 0 points  Total Score 0 points 0 points    Immunizations Immunization History  Administered Date(s) Administered   Fluad Quad(high Dose 65+) 09/06/2019, 09/12/2020   Influenza, High Dose Seasonal PF 09/24/2017, 09/30/2018, 09/27/2021   Influenza,inj,Quad PF,6+ Mos 10/03/2015   Influenza-Unspecified 09/30/2018, 09/29/2022   Moderna Sars-Covid-2 Vaccination 02/03/2020, 03/06/2020   PFIZER(Purple Top)SARS-COV-2 Vaccination 04/05/2021, 09/27/2021   Pneumococcal Conjugate-13 12/12/2014   Pneumococcal Polysaccharide-23 12/18/2015   Pneumococcal-Unspecified 12/29/2006   RSV,unspecified 11/04/2022   Unspecified SARS-COV-2 Vaccination 10/02/2022   Zoster Recombinat (Shingrix) 12/21/2017   Zoster, Live 12/19/2016    TDAP status: Due, Education has been provided regarding the importance of this vaccine. Advised may receive this vaccine at local pharmacy or Health Dept. Aware to provide a copy of the  vaccination record if obtained from local pharmacy or Health Dept. Verbalized acceptance and understanding.  Flu Vaccine status: Up to date  Pneumococcal vaccine status: Up to date  Covid-19 vaccine status: Information provided on how to obtain vaccines.   Qualifies for Shingles Vaccine? Yes   Zostavax completed Yes   Shingrix Completed?: No.    Education has been provided regarding the importance of this vaccine. Patient has been advised to call insurance company to determine out of pocket expense if they have not yet received this vaccine. Advised may also receive vaccine at local pharmacy or Health Dept. Verbalized acceptance and understanding.  Screening Tests Health Maintenance  Topic Date Due   DTaP/Tdap/Td (1 - Tdap) Never done   OPHTHALMOLOGY EXAM  03/30/2017   Zoster Vaccines- Shingrix (2 of 2) 02/15/2018   COVID-19 Vaccine (6 - 2023-24 season) 08/31/2023 (Originally 11/27/2022)   HEMOGLOBIN A1C  06/06/2023   Diabetic kidney evaluation - eGFR measurement  12/06/2023   Diabetic kidney evaluation - Urine ACR  12/06/2023   FOOT EXAM  12/06/2023   Medicare Annual Wellness (AWV)  02/28/2024   COLONOSCOPY (Pts 45-2yr Insurance coverage will need to  be confirmed)  01/25/2025   Pneumonia Vaccine 78+ Years old  Completed   INFLUENZA VACCINE  Completed   HPV VACCINES  Aged Out   Hepatitis C Screening  Discontinued    Health Maintenance  Health Maintenance Due  Topic Date Due   DTaP/Tdap/Td (1 - Tdap) Never done   OPHTHALMOLOGY EXAM  03/30/2017   Zoster Vaccines- Shingrix (2 of 2) 02/15/2018    Colorectal cancer screening: Type of screening: Colonoscopy. Completed 01/25/15. Repeat every 10 years  Lung Cancer Screening: (Low Dose CT Chest recommended if Age 106-80 years, 30 pack-year currently smoking OR have quit w/in 15years.) does not qualify.   Lung Cancer Screening Referral: n/a  Additional Screening:  Hepatitis C Screening: does qualify;   Vision Screening:  Recommended annual ophthalmology exams for early detection of glaucoma and other disorders of the eye. Is the patient up to date with their annual eye exam?  No  Who is the provider or what is the name of the office in which the patient attends annual eye exams? none If pt is not established with a provider, would they like to be referred to a provider to establish care? No .   Dental Screening: Recommended annual dental exams for proper oral hygiene  Community Resource Referral / Chronic Care Management: CRR required this visit?  No   CCM required this visit?  No      Plan:     I have personally reviewed and noted the following in the patient's chart:   Medical and social history Use of alcohol, tobacco or illicit drugs  Current medications and supplements including opioid prescriptions. Patient is not currently taking opioid prescriptions. Functional ability and status Nutritional status Physical activity Advanced directives List of other physicians Hospitalizations, surgeries, and ER visits in previous 12 months Vitals Screenings to include cognitive, depression, and falls Referrals and appointments  In addition, I have reviewed and discussed with patient certain preventive protocols, quality metrics, and best practice recommendations. A written personalized care plan for preventive services as well as general preventive health recommendations were provided to patient.     Vanetta Mulders, [place]   [id number]   Due to this being a virtual visit, the after visit summary with patients personalized plan was offered to patient via mail or my-chart. per request, patient was mailed a copy of AVS.  Nurse Notes: No concerns

## 2023-02-28 NOTE — Patient Instructions (Addendum)
Jeremy Rivera , Thank you for taking time to come for your Medicare Wellness Visit. I appreciate your ongoing commitment to your health goals. Please review the following plan we discussed and let me know if I can assist you in the future.   These are the goals we discussed:  Goals      Exercise 3x per week (30 min per time)     Stay healthy, keep moving.         This is a list of the screening recommended for you and due dates:  Health Maintenance  Topic Date Due   DTaP/Tdap/Td vaccine (1 - Tdap) Never done   Eye exam for diabetics  03/30/2017   Zoster (Shingles) Vaccine (2 of 2) 02/15/2018   COVID-19 Vaccine (6 - 2023-24 season) 08/31/2023*   Hemoglobin A1C  06/06/2023   Yearly kidney function blood test for diabetes  12/06/2023   Yearly kidney health urinalysis for diabetes  12/06/2023   Complete foot exam   12/06/2023   Medicare Annual Wellness Visit  02/28/2024   Colon Cancer Screening  01/25/2025   Pneumonia Vaccine  Completed   Flu Shot  Completed   HPV Vaccine  Aged Out   Hepatitis C Screening: USPSTF Recommendation to screen - Ages 18-79 yo.  Discontinued  *Topic was postponed. The date shown is not the original due date.    Advanced directives: Forms are available if you choose in the future to pursue completion.  This is recommended in order to make sure that your health wishes are honored in the event that you are unable to verbalize them to the provider.    Conditions/risks identified: Aim for 30 minutes of exercise or brisk walking, 6-8 glasses of water, and 5 servings of fruits and vegetables each day.   Next appointment: Follow up in one year for your annual wellness visit.   Preventive Care 24 Years and Older, Male  Preventive care refers to lifestyle choices and visits with your health care provider that can promote health and wellness. What does preventive care include? A yearly physical exam. This is also called an annual well check. Dental exams once or  twice a year. Routine eye exams. Ask your health care provider how often you should have your eyes checked. Personal lifestyle choices, including: Daily care of your teeth and gums. Regular physical activity. Eating a healthy diet. Avoiding tobacco and drug use. Limiting alcohol use. Practicing safe sex. Taking low doses of aspirin every day. Taking vitamin and mineral supplements as recommended by your health care provider. What happens during an annual well check? The services and screenings done by your health care provider during your annual well check will depend on your age, overall health, lifestyle risk factors, and family history of disease. Counseling  Your health care provider may ask you questions about your: Alcohol use. Tobacco use. Drug use. Emotional well-being. Home and relationship well-being. Sexual activity. Eating habits. History of falls. Memory and ability to understand (cognition). Work and work Statistician. Screening  You may have the following tests or measurements: Height, weight, and BMI. Blood pressure. Lipid and cholesterol levels. These may be checked every 5 years, or more frequently if you are over 7 years old. Skin check. Lung cancer screening. You may have this screening every year starting at age 66 if you have a 30-pack-year history of smoking and currently smoke or have quit within the past 15 years. Fecal occult blood test (FOBT) of the stool. You may have this  test every year starting at age 19. Flexible sigmoidoscopy or colonoscopy. You may have a sigmoidoscopy every 5 years or a colonoscopy every 10 years starting at age 71. Prostate cancer screening. Recommendations will vary depending on your family history and other risks. Hepatitis C blood test. Hepatitis B blood test. Sexually transmitted disease (STD) testing. Diabetes screening. This is done by checking your blood sugar (glucose) after you have not eaten for a while (fasting).  You may have this done every 1-3 years. Abdominal aortic aneurysm (AAA) screening. You may need this if you are a current or former smoker. Osteoporosis. You may be screened starting at age 40 if you are at high risk. Talk with your health care provider about your test results, treatment options, and if necessary, the need for more tests. Vaccines  Your health care provider may recommend certain vaccines, such as: Influenza vaccine. This is recommended every year. Tetanus, diphtheria, and acellular pertussis (Tdap, Td) vaccine. You may need a Td booster every 10 years. Zoster vaccine. You may need this after age 81. Pneumococcal 13-valent conjugate (PCV13) vaccine. One dose is recommended after age 16. Pneumococcal polysaccharide (PPSV23) vaccine. One dose is recommended after age 75. Talk to your health care provider about which screenings and vaccines you need and how often you need them. This information is not intended to replace advice given to you by your health care provider. Make sure you discuss any questions you have with your health care provider. Document Released: 01/12/2016 Document Revised: 09/04/2016 Document Reviewed: 10/17/2015 Elsevier Interactive Patient Education  2017 [place] Prevention in the Home Falls can cause injuries. They can happen to people of all ages. There are many things you can do to make your home safe and to help prevent falls. What can I do on the outside of my home? Regularly fix the edges of walkways and driveways and fix any cracks. Remove anything that might make you trip as you walk through a door, such as a raised step or threshold. Trim any bushes or trees on the path to your home. Use bright outdoor lighting. Clear any walking paths of anything that might make someone trip, such as rocks or tools. Regularly check to see if handrails are loose or broken. Make sure that both sides of any steps have handrails. Any raised decks and  porches should have guardrails on the edges. Have any leaves, snow, or ice cleared regularly. Use sand or salt on walking paths during winter. Clean up any spills in your garage right away. This includes oil or grease spills. What can I do in the bathroom? Use night lights. Install grab bars by the toilet and in the tub and shower. Do not use towel bars as grab bars. Use non-skid mats or decals in the tub or shower. If you need to sit down in the shower, use a plastic, non-slip stool. Keep the floor dry. Clean up any water that spills on the floor as soon as it happens. Remove soap buildup in the tub or shower regularly. Attach bath mats securely with double-sided non-slip rug tape. Do not have throw rugs and other things on the floor that can make you trip. What can I do in the bedroom? Use night lights. Make sure that you have a light by your bed that is easy to reach. Do not use any sheets or blankets that are too big for your bed. They should not hang down onto the floor. Have a firm chair that  has side arms. You can use this for support while you get dressed. Do not have throw rugs and other things on the floor that can make you trip. What can I do in the kitchen? Clean up any spills right away. Avoid walking on wet floors. Keep items that you use a lot in easy-to-reach places. If you need to reach something above you, use a strong step stool that has a grab bar. Keep electrical cords out of the way. Do not use floor polish or wax that makes floors slippery. If you must use wax, use non-skid floor wax. Do not have throw rugs and other things on the floor that can make you trip. What can I do with my stairs? Do not leave any items on the stairs. Make sure that there are handrails on both sides of the stairs and use them. Fix handrails that are broken or loose. Make sure that handrails are as long as the stairways. Check any carpeting to make sure that it is firmly attached to the  stairs. Fix any carpet that is loose or worn. Avoid having throw rugs at the top or bottom of the stairs. If you do have throw rugs, attach them to the floor with carpet tape. Make sure that you have a light switch at the top of the stairs and the bottom of the stairs. If you do not have them, ask someone to add them for you. What else can I do to help prevent falls? Wear shoes that: Do not have high heels. Have rubber bottoms. Are comfortable and fit you well. Are closed at the toe. Do not wear sandals. If you use a stepladder: Make sure that it is fully opened. Do not climb a closed stepladder. Make sure that both sides of the stepladder are locked into place. Ask someone to hold it for you, if possible. Clearly mark and make sure that you can see: Any grab bars or handrails. First and last steps. Where the edge of each step is. Use tools that help you move around (mobility aids) if they are needed. These include: Canes. Walkers. Scooters. Crutches. Turn on the lights when you go into a dark area. Replace any light bulbs as soon as they burn out. Set up your furniture so you have a clear path. Avoid moving your furniture around. If any of your floors are uneven, fix them. If there are any pets around you, be aware of where they are. Review your medicines with your doctor. Some medicines can make you feel dizzy. This can increase your chance of falling. Ask your doctor what other things that you can do to help prevent falls. This information is not intended to replace advice given to you by your health care provider. Make sure you discuss any questions you have with your health care provider. Document Released: 10/12/2009 Document Revised: 05/23/2016 Document Reviewed: 01/20/2015 Elsevier Interactive Patient Education  2017 [employer].

## 2023-03-06 ENCOUNTER — Telehealth: Payer: Self-pay | Admitting: *Deleted

## 2023-03-06 MED ORDER — METOPROLOL SUCCINATE ER 25 MG PO TB24: 12.5000 mg | ORAL_TABLET | Freq: Every day | ORAL | 6 refills | Status: DC

## 2023-03-06 NOTE — Telephone Encounter (Signed)
Laurine Blazer, LPN [id number]  [id number] AM EST Back to Top    Notified, copy to pcp.  New medication sent to [facility] now.  Appointment scheduled for April with Dr. Harl Bowie.   Laurine Blazer, LPN [id number] AM EST     Left message to return call.   Arnoldo Lenis, MD 02/26/2023  5:12 PM EST     Echo shows heart pumping function just mildly below normal. Need to continue working with meds to help strengthen heart. Can he start toprol 12.'5mg'$  daily, f/u with me or PA 6 weeks to consider additional med changes   Zandra Abts MD

## 2023-03-18 ENCOUNTER — Other Ambulatory Visit: Payer: Self-pay | Admitting: Family Medicine

## 2023-03-18 DIAGNOSIS — E119 Type 2 diabetes mellitus without complications: Secondary | ICD-10-CM

## 2023-03-18 DIAGNOSIS — I10 Essential (primary) hypertension: Secondary | ICD-10-CM

## 2023-03-18 DIAGNOSIS — I5042 Chronic combined systolic (congestive) and diastolic (congestive) heart failure: Secondary | ICD-10-CM

## 2023-04-10 ENCOUNTER — Other Ambulatory Visit: Payer: Self-pay | Admitting: Family Medicine

## 2023-04-10 DIAGNOSIS — I10 Essential (primary) hypertension: Secondary | ICD-10-CM

## 2023-04-23 ENCOUNTER — Encounter: Payer: Self-pay | Admitting: Cardiology

## 2023-04-23 ENCOUNTER — Ambulatory Visit: Payer: Medicare Other | Attending: Cardiology | Admitting: Cardiology

## 2023-04-23 VITALS — BP 122/78 | HR 84 | Ht 71.0 in | Wt 239.0 lb

## 2023-04-23 DIAGNOSIS — Z79899 Other long term (current) drug therapy: Secondary | ICD-10-CM | POA: Insufficient documentation

## 2023-04-23 DIAGNOSIS — I5022 Chronic systolic (congestive) heart failure: Secondary | ICD-10-CM | POA: Insufficient documentation

## 2023-04-23 DIAGNOSIS — I10 Essential (primary) hypertension: Secondary | ICD-10-CM | POA: Diagnosis not present

## 2023-04-23 MED ORDER — ENTRESTO 24-26 MG PO TABS: 1.0000 | ORAL_TABLET | Freq: Two times a day (BID) | ORAL | 6 refills | Status: DC

## 2023-04-23 NOTE — Progress Notes (Addendum)
Clinical Summary Jeremy Rivera is a 72 y.o.male seen today for follow up of the following medical problems.   1. Chronic systolic HF - echo 06/2015 LVEF 40%, grade II diastolic dysfunction.   - echo Jan 2017 LVEF 40-45%, grade I diastolic dysfunction - 02/2016 nuclear stress moderate inferior defect probable attenuation, cannot rule out scar. No ischemia.      - appears no longer taking coreg, unclear what happened to it. Was on at our last visit in 2017   01/2023 echo: LVEF 45%,  - started toprol 12.5mg  daily, tolerating without side effects - no recent SOB/DOE   Other medical problems not addressed this visit   2. HTN - he is compliant with meds - home bp's 120s/70s   3. OSA  -10/2016 severe OSA by sleep study. He is down 30 lbs since that time - has not been wearing CPAP -    4. Hyperlipidmia -11/2022 TC 133 TG 173 HDL 38 LDL 66 - he is on rosuvatatin 10mg   Past Medical History:  Diagnosis Date   Diabetes mellitus without complication (HCC)    Diastolic dysfunction    grade 2   ED (erectile dysfunction)    Herniated lumbar intervertebral disc    Hypertension    Irregular heart beat    a. Pt does not know official diagnosis.   Prostate hypertrophy      No Known Allergies   Current Outpatient Medications  Medication Sig Dispense Refill   metoprolol succinate (TOPROL XL) 25 MG 24 hr tablet Take 0.5 tablets (12.5 mg total) by mouth daily. 15 tablet 6   Accu-Chek Softclix Lancets lancets TEST BLOOD SUGAR ONCE DAILY AS DIRECTED DX E11.9 100 each 5   amLODipine-benazepril (LOTREL) 10-40 MG capsule TAKE 1 CAPSULE BY MOUTH EVERY DAY 90 capsule 0   aspirin EC 81 MG tablet Take 81 mg by mouth daily.     blood glucose meter kit and supplies Dispense based on patient and insurance preference. Use up to four times daily as directed. (FOR ICD-10 E10.9, E11.9). 1 each 0   chlorthalidone (HYGROTON) 25 MG tablet Take 1 tablet (25 mg total) by mouth daily. 90 tablet 3    empagliflozin (JARDIANCE) 10 MG TABS tablet Take 1 tablet (10 mg total) by mouth daily. 90 tablet 3   finasteride (PROSCAR) 5 MG tablet Take 1 tablet (5 mg total) by mouth daily. 90 tablet 3   glipiZIDE (GLUCOTROL) 5 MG tablet TAKE 2 TABLETS (10 MG TOTAL) BY MOUTH 2 (TWO) TIMES DAILY BEFORE A MEAL. 360 tablet 0   glucose blood (ONE TOUCH ULTRA TEST) test strip TEST BLOOD SUGAR ONCE DAILY AS DIRECTED **DX E11.9 50 each 01   metFORMIN (GLUCOPHAGE) 500 MG tablet TAKE 1 TABLET BY MOUTH 2 TIMES DAILY WITH A MEAL. 180 tablet 0   rosuvastatin (CRESTOR) 10 MG tablet TAKE 1 TABLET BY MOUTH EVERY DAY 90 tablet 0   Semaglutide (RYBELSUS) 3 MG TABS Take 3 mg by mouth daily. After 1 month, start 7 mg dose. (Patient not taking: Reported on 02/28/2023) 30 tablet 0   Semaglutide (RYBELSUS) 7 MG TABS Take 7 mg by mouth daily. (Patient not taking: Reported on 02/28/2023) 30 tablet 0   spironolactone (ALDACTONE) 25 MG tablet TAKE 1 TABLET (25 MG TOTAL) BY MOUTH DAILY. 90 tablet 0   triamcinolone cream (KENALOG) 0.1 % APPLY THIN LAYER TWICE DAILY TO RASH 60 g 0   No current facility-administered medications for this visit.  Past Surgical History:  Procedure Laterality Date   BACK SURGERY  1994 Dr. Lesly Rubenstein SURGERY  1994 Dr. Roxan Hockey   COLONOSCOPY     one polyp   LUMBAR LAMINECTOMY/DECOMPRESSION MICRODISCECTOMY Left 06/22/2014   Procedure: LUMBAR LAMINECTOMY/DECOMPRESSION MICRODISCECTOMY 1 LEVEL;  Surgeon: Carmela Hurt, MD;  Location: [facility];  Service: Neurosurgery;  Laterality: Left;  Redo Left Lumbar Three-Four Microdiskectomy     No Known Allergies    Family History  Problem Relation Age of Onset   Hypertension Mother    Diabetes Mother    Transient ischemic attack Mother        Died of ministrokes at 28   Depression Father        Died of "grieving"   COPD Brother        Died of COPD   Throat cancer Sister        Died of throat CA   Colon cancer Neg Hx      Social  History Jeremy Rivera reports that he has never smoked. He has never used smokeless tobacco. Jeremy Rivera reports no history of alcohol use.   Review of Systems CONSTITUTIONAL: No weight loss, fever, chills, weakness or fatigue.  HEENT: Eyes: No visual loss, blurred vision, double vision or yellow sclerae.No hearing loss, sneezing, congestion, runny nose or sore throat.  SKIN: No rash or itching.  CARDIOVASCULAR: per hpi RESPIRATORY: No shortness of breath, cough or sputum.  GASTROINTESTINAL: No anorexia, nausea, vomiting or diarrhea. No abdominal pain or blood.  GENITOURINARY: No burning on urination, no polyuria NEUROLOGICAL: No headache, dizziness, syncope, paralysis, ataxia, numbness or tingling in the extremities. No change in bowel or bladder control.  MUSCULOSKELETAL: No muscle, back pain, joint pain or stiffness.  LYMPHATICS: No enlarged nodes. No history of splenectomy.  PSYCHIATRIC: No history of depression or anxiety.  ENDOCRINOLOGIC: No reports of sweating, cold or heat intolerance. No polyuria or polydipsia.  Marland Kitchen   Physical Examination Today's Vitals   04/23/23 1528  BP: 122/78  Pulse: 84  SpO2: 97%  Weight: 239 lb (108.4 kg)  Height: 5\' 11"  (1.803 m)   Body mass index is 33.33 kg/m.  Gen: resting comfortably, no acute distress HEENT: no scleral icterus, pupils equal round and reactive, no palptable cervical adenopathy,  CV: RRR, no mrg, no jvd Resp: Clear to auscultation bilaterally GI: abdomen is soft, non-tender, non-distended, normal bowel sounds, no hepatosplenomegaly MSK: extremities are warm, no edema.  Skin: warm, no rash Neuro:  no focal deficits Psych: appropriate affect   Diagnostic Studies  06/2015 echo Study Conclusions  - Left ventricle: The cavity size was normal. Wall thickness was   increased in a pattern of mild LVH. Systolic function was mildly   to moderately reduced. The estimated ejection fraction was = 40%.   No regional wall motion  abnormalities. Features are consistent   with a pseudonormal left ventricular filling pattern, with   concomitant abnormal relaxation and increased filling pressure   (grade 2 diastolic dysfunction). - Aortic valve: Mildly calcified annulus. Trileaflet; mildly   thickened leaflets. Valve area (VTI): 2.41 cm^2. Valve area   (Vmax): 2.49 cm^2. - Mitral valve: There was mild regurgitation. - Left atrium: The atrium was moderately dilated. - Pulmonary veins: There is blunting of systolic pulmonary vein   flow consistent with elevated LA pressures. - Atrial septum: No defect or patent foramen ovale was identified. - Technically difficult study. Echo contrast was used to enhance   visualization.  Jan 2017 echo Study Conclusions  - Left ventricle: The cavity size was normal. Wall thickness was   increased in a pattern of mild LVH. Systolic function was mildly   to moderately reduced. The estimated ejection fraction was in the   range of 40% to 45%. Diffuse hypokinesis. Doppler parameters are   consistent with abnormal left ventricular relaxation (grade 1   diastolic dysfunction). Normal filling pressures. - Aortic valve: Mildly calcified annulus. Trileaflet; mildly   calcified leaflets. - Mitral valve: There was trivial regurgitation. - Left atrium: The atrium was mildly dilated. - Right atrium: Central venous pressure (est): 3 mm Hg. - Tricuspid valve: There was trivial regurgitation. - Pulmonary arteries: Systolic pressure could not be accurately   estimated. - Pericardium, extracardiac: There was no pericardial effusion.  Impressions:  - Mild LVH with LVEF approximately 40-45%, diffuse hypokinesis with   regional variation. Grade 1 diastolic dysfunction with normal   estimated filling pressures. Mild left atrial enlargement.   Trivial mitral regurgitation. Mildly sclerotic aortic valve.   Trivial tricuspid regurgitation, PASP not estimated.   02/2016 Exercise MPI Equivocal ST  segment abnormalities noted in leads II, III, aVF, and V4 through V6 in the absence of chest pain. Suboptimal heart rate response achieved on treadmill, Lexiscan utilized. Occasional to frequent PVCs. Blood pressure demonstrated a hypertensive response to exercise. Moderate-sized, moderate intensity, inferior and basal inferoseptal defect that is fixed and most consistent with soft tissue attenuation, although scar is also possible. This is a high risk study based on calculated LVEF. Nuclear stress EF: 28%.   01/2023 echo 1. Left ventricular ejection fraction, by estimation, is 45%. The left  ventricle has mildly decreased function. The left ventricle has no  regional wall motion abnormalities. Indeterminate diastolic filling due to  E-A fusion. The average left ventricular   global longitudinal strain is -14.2 %. The global longitudinal strain is  abnormal.   2. Right ventricular systolic function is normal. The right ventricular  size is normal.   3. The mitral valve is abnormal. No evidence of mitral valve  regurgitation. No evidence of mitral stenosis. Moderate mitral annular  calcification.   4. The aortic valve is tricuspid. There is mild calcification of the  aortic valve. Aortic valve regurgitation is not visualized. No aortic  stenosis is present.   5. The inferior vena cava is normal in size with greater than 50%  respiratory variability, suggesting right atrial pressure of 3 mmHg.   Assessment and Plan   1. Chronic systolic HF - LVEF 45%, - noninvasive ischemic testing without clear evidence of ICM - no symptoms - jardiance too expensive. Off coreg I am unclear on this history. We recently just started toprol 12.5mg  daily.  - stop norvasc/lisinopril combo pill, after 48 hrs start entresto 24/26mg  bid. Check BMET in 2 weeks           Antoine Poche, M.D.

## 2023-04-23 NOTE — Patient Instructions (Addendum)
Medication Instructions:  Your physician has recommended you make the following change in your medication:  Stop amlodipine/benazepril Start entresto 24/26 mg twice daily on Friday day, 04/25/2023. Continue other medications the same  Labwork: BMET in 2 weeks (05/09/2023) Non-fasting [facility]  Testing/Procedures: none  Follow-Up: Your physician recommends that you schedule a follow-up appointment in: August as plannd  Any Other Special Instructions Will Be Listed Below (If Applicable).  If you need a refill on your cardiac medications before your next appointment, please call your pharmacy.

## 2023-04-26 ENCOUNTER — Other Ambulatory Visit: Payer: Self-pay | Admitting: Family Medicine

## 2023-05-06 DIAGNOSIS — N400 Enlarged prostate without lower urinary tract symptoms: Secondary | ICD-10-CM | POA: Diagnosis not present

## 2023-05-08 DIAGNOSIS — Z79899 Other long term (current) drug therapy: Secondary | ICD-10-CM | POA: Diagnosis not present

## 2023-05-08 DIAGNOSIS — I10 Essential (primary) hypertension: Secondary | ICD-10-CM | POA: Diagnosis not present

## 2023-05-09 LAB — BASIC METABOLIC PANEL
BUN/Creatinine Ratio: 19 (ref 10–24)
BUN: 23 mg/dL (ref 8–27)
CO2: 22 mmol/L (ref 20–29)
Calcium: 9.4 mg/dL (ref 8.6–10.2)
Chloride: 97 mmol/L (ref 96–106)
Creatinine, Ser: 1.23 mg/dL (ref 0.76–1.27)
Glucose: 263 mg/dL — ABNORMAL HIGH (ref 70–99)
Potassium: 4.7 mmol/L (ref 3.5–5.2)
Sodium: 134 mmol/L (ref 134–144)
eGFR: 62 mL/min/{1.73_m2} (ref >59)

## 2023-05-22 DIAGNOSIS — R972 Elevated prostate specific antigen [PSA]: Secondary | ICD-10-CM | POA: Diagnosis not present

## 2023-05-22 DIAGNOSIS — N400 Enlarged prostate without lower urinary tract symptoms: Secondary | ICD-10-CM | POA: Diagnosis not present

## 2023-06-03 ENCOUNTER — Telehealth: Payer: Self-pay | Admitting: *Deleted

## 2023-06-03 NOTE — Telephone Encounter (Signed)
-----   Message from Antoine Poche, MD sent at 06/02/2023 11:10 AM EDT ----- Normal labs  JBranch MD

## 2023-06-03 NOTE — Telephone Encounter (Signed)
Lesle Chris, LPN 07/31/9561  1:30 PM EDT Back to Top    Notified, copy to pcp.

## 2023-06-06 ENCOUNTER — Ambulatory Visit (INDEPENDENT_AMBULATORY_CARE_PROVIDER_SITE_OTHER): Payer: Medicare Other | Admitting: Family Medicine

## 2023-06-06 ENCOUNTER — Encounter: Payer: Self-pay | Admitting: Family Medicine

## 2023-06-06 VITALS — BP 130/81 | HR 79 | Temp 97.9°F | Ht 71.0 in | Wt 238.0 lb

## 2023-06-06 DIAGNOSIS — E119 Type 2 diabetes mellitus without complications: Secondary | ICD-10-CM | POA: Diagnosis not present

## 2023-06-06 DIAGNOSIS — I5042 Chronic combined systolic (congestive) and diastolic (congestive) heart failure: Secondary | ICD-10-CM

## 2023-06-06 DIAGNOSIS — E782 Mixed hyperlipidemia: Secondary | ICD-10-CM

## 2023-06-06 DIAGNOSIS — Z7984 Long term (current) use of oral hypoglycemic drugs: Secondary | ICD-10-CM | POA: Diagnosis not present

## 2023-06-06 DIAGNOSIS — I10 Essential (primary) hypertension: Secondary | ICD-10-CM

## 2023-06-06 MED ORDER — GLUCOSE BLOOD VI STRP
ORAL_STRIP | 4 refills | Status: AC
Start: 2023-06-06 — End: ?

## 2023-06-06 NOTE — Patient Instructions (Signed)
Continue your medications.  Labs today.  Follow up in 3 months.  

## 2023-06-07 LAB — HEMOGLOBIN A1C: Est. average glucose Bld gHb Est-mCnc: 171 mg/dL

## 2023-06-08 NOTE — Assessment & Plan Note (Signed)
At goal. Continue current medications. 

## 2023-06-08 NOTE — Assessment & Plan Note (Signed)
LDL @ goal. Continue Crestor.

## 2023-06-08 NOTE — Assessment & Plan Note (Signed)
Stable.  Continue current medications.

## 2023-06-08 NOTE — Progress Notes (Signed)
Subjective:  Patient ID: Jeremy Rivera, male    DOB: 01-28-1951  Age: 72 y.o. MRN: [id number]  CC: Chief Complaint  Patient presents with   6 month folow up    Pt reports to have no concerns with his diabetes, hypertension, and hyperlipidemia. Pt's Cardiologist d/c Amlodipine in April.    HPI:  72 year old male with hypertension, combined heart failure, type 2 diabetes, presents for follow-up.  Patient recently placed on Entresto by cardiology.  Patient now on metoprolol, chlorthalidone, Entresto, and spironolactone.  No chest pain or shortness of breath at this time.  Blood pressure well-controlled the above medications.  Patient's diabetes has not been at goal.  Last A1c was 8.7.  Needs A1c today.  He states that his blood sugars are improving.  Needs refill on test trips.  He is currently on 500 mg twice daily, and Rybelsus 7 mg daily.  He is also on glipizide.  Denies hypoglycemia.  No issues with Rybelsus.  He is overdue for eye exam.  He states that he will schedule this.  Patient's lipids are at goal on Crestor.  Patient Active Problem List   Diagnosis Date Noted   Type 2 diabetes mellitus (HCC) 01/21/2022   Family history of prostate cancer 04/10/2021   Chronic combined systolic and diastolic CHF, NYHA class 2 (HCC) 07/14/2015   Essential hypertension, benign 07/25/2013   Mixed hyperlipidemia 07/25/2013   Erectile dysfunction 07/25/2013    Social Hx   Social History   Socioeconomic History   Marital status: Married    Spouse name: Delphine   Number of children: 3   Years of education: Not on file   Highest education level: Not on file  Occupational History   Not on file  Tobacco Use   Smoking status: Never   Smokeless tobacco: Never  Substance and Sexual Activity   Alcohol use: No    Alcohol/week: 0.0 standard drinks of alcohol   Drug use: No   Sexual activity: Yes    Partners: Female  Other Topics Concern   Not on file  Social History Narrative    Retired from [employer].   1 son, 2 daughters.   6 grandchildren.   Social Determinants of Health   Financial Resource Strain: Low Risk  (02/28/2023)   Overall Financial Resource Strain (CARDIA)    Difficulty of Paying Living Expenses: Not hard at all  Food Insecurity: No Food Insecurity (02/28/2023)   Hunger Vital Sign    Worried About Running Out of Food in the Last Year: Never true    Ran Out of Food in the Last Year: Never true  Transportation Needs: No Transportation Needs (02/28/2023)   PRAPARE - Administrator, Civil Service (Medical): No    Lack of Transportation (Non-Medical): No  Physical Activity: Insufficiently Active (02/28/2023)   Exercise Vital Sign    Days of Exercise per Week: 3 days    Minutes of Exercise per Session: 30 min  Stress: No Stress Concern Present (02/28/2023)   [employer] of Occupational Health - Occupational Stress Questionnaire    Feeling of Stress : Not at all  Social Connections: Socially Integrated (02/28/2023)   Social Connection and Isolation Panel [NHANES]    Frequency of Communication with Friends and Family: More than three times a week    Frequency of Social Gatherings with Friends and Family: More than three times a week    Attends Religious Services: More than 4 times per year    Active  Member of Clubs or Organizations: Yes    Attends Engineer, structural: More than 4 times per year    Marital Status: Married    Review of Systems Per HPI  Objective:  BP 130/81   Pulse 79   Temp 97.9 F (36.6 C)   Ht 5\' 11"  (1.803 m)   Wt 238 lb (108 kg)   SpO2 96%   BMI 33.19 kg/m      06/06/2023    9:54 AM 04/23/2023    3:28 PM 02/28/2023    5:06 PM  BP/Weight  Systolic BP 130 122   Diastolic BP 81 78   Wt. (Lbs) 238 239 240  BMI 33.19 kg/m2 33.33 kg/m2 33.47 kg/m2    Physical Exam Vitals and nursing note reviewed.  Constitutional:      General: He is not in acute distress.    Appearance: Normal appearance.  HENT:      Head: Normocephalic and atraumatic.  Eyes:     General:        Right eye: No discharge.        Left eye: No discharge.     Conjunctiva/sclera: Conjunctivae normal.  Cardiovascular:     Rate and Rhythm: Normal rate and regular rhythm.  Pulmonary:     Effort: Pulmonary effort is normal.     Breath sounds: Normal breath sounds. No wheezing, rhonchi or rales.  Neurological:     Mental Status: He is alert.  Psychiatric:        Mood and Affect: Mood normal.        Behavior: Behavior normal.     Lab Results  Component Value Date   WBC 8.2 01/21/2022   HGB 14.2 01/21/2022   HCT 43.5 01/21/2022   PLT 237 01/21/2022   GLUCOSE 167 (H) 06/06/2023   CHOL 120 06/06/2023   TRIG 189 (H) 06/06/2023   HDL 39 (L) 06/06/2023   LDLCALC 50 06/06/2023   ALT 17 06/06/2023   AST 19 06/06/2023   NA 136 06/06/2023   K 5.0 06/06/2023   CL 96 06/06/2023   CREATININE 1.07 06/06/2023   BUN 17 06/06/2023   CO2 24 06/06/2023   TSH 1.382 07/12/2015   PSA 6.47 (H) 04/05/2014   HGBA1C 7.6 (H) 06/06/2023   MICROALBUR 3.2 (H) 01/03/2015     Assessment & Plan:   Problem List Items Addressed This Visit       Cardiovascular and Mediastinum   Essential hypertension, benign    At goal. Continue current medications.       Chronic combined systolic and diastolic CHF, NYHA class 2 (HCC)    Stable. Continue current medications.         Endocrine   Type 2 diabetes mellitus (HCC) - Primary    A1C returned at 7.6.  Will increase Metformin and likely Rybelsus as well.       Relevant Orders   CMP14+EGFR (Completed)   Hemoglobin A1c (Completed)   Microalbumin / creatinine urine ratio (Completed)     Other   Mixed hyperlipidemia    LDL @ goal. Continue Crestor.       Relevant Orders   Lipid panel (Completed)    Meds ordered this encounter  Medications   glucose blood (ONE TOUCH ULTRA TEST) test strip    Sig: TEST BLOOD SUGAR ONCE DAILY AS DIRECTED **DX E11.9    Dispense:  100 each     Refill:  4    DX Code Needed  .  Follow-up:  Return in about 3 months (around 09/06/2023).  Everlene Other DO [facility]

## 2023-06-08 NOTE — Assessment & Plan Note (Signed)
A1C returned at 7.6.  Will increase Metformin and likely Rybelsus as well.

## 2023-06-09 LAB — HEMOGLOBIN A1C: Hgb A1c MFr Bld: 7.6 % — ABNORMAL HIGH (ref 4.8–5.6)

## 2023-06-10 LAB — LIPID PANEL
Chol/HDL Ratio: 3.1 ratio (ref 0.0–5.0)
Cholesterol, Total: 120 mg/dL (ref 100–199)
HDL: 39 mg/dL — ABNORMAL LOW (ref >39)
LDL Chol Calc (NIH): 50 mg/dL (ref 0–99)
Triglycerides: 189 mg/dL — ABNORMAL HIGH (ref 0–149)
VLDL Cholesterol Cal: 31 mg/dL (ref 5–40)

## 2023-06-10 LAB — CMP14+EGFR
ALT: 17 IU/L (ref 0–44)
AST: 19 IU/L (ref 0–40)
Albumin/Globulin Ratio: 1.9 (ref 1.2–2.2)
Albumin: 4.7 g/dL (ref 3.8–4.8)
Alkaline Phosphatase: 77 IU/L (ref 44–121)
BUN/Creatinine Ratio: 16 (ref 10–24)
BUN: 17 mg/dL (ref 8–27)
Bilirubin Total: 0.3 mg/dL (ref 0.0–1.2)
CO2: 24 mmol/L (ref 20–29)
Calcium: 10 mg/dL (ref 8.6–10.2)
Chloride: 96 mmol/L (ref 96–106)
Creatinine, Ser: 1.07 mg/dL (ref 0.76–1.27)
Globulin, Total: 2.5 g/dL (ref 1.5–4.5)
Glucose: 167 mg/dL — ABNORMAL HIGH (ref 70–99)
Potassium: 5 mmol/L (ref 3.5–5.2)
Sodium: 136 mmol/L (ref 134–144)
Total Protein: 7.2 g/dL (ref 6.0–8.5)
eGFR: 73 mL/min/{1.73_m2} (ref >59)

## 2023-06-10 LAB — SPECIMEN STATUS REPORT

## 2023-06-10 LAB — MICROALBUMIN / CREATININE URINE RATIO

## 2023-06-15 ENCOUNTER — Other Ambulatory Visit: Payer: Self-pay | Admitting: Family Medicine

## 2023-06-15 DIAGNOSIS — E119 Type 2 diabetes mellitus without complications: Secondary | ICD-10-CM

## 2023-06-15 DIAGNOSIS — I10 Essential (primary) hypertension: Secondary | ICD-10-CM

## 2023-06-15 DIAGNOSIS — I5042 Chronic combined systolic (congestive) and diastolic (congestive) heart failure: Secondary | ICD-10-CM

## 2023-06-23 ENCOUNTER — Telehealth: Payer: Self-pay

## 2023-06-23 NOTE — Telephone Encounter (Signed)
Patient received his lab results , he is agreeable to increasing metformin and rybelsius , please send to [facility] [place].

## 2023-06-25 ENCOUNTER — Other Ambulatory Visit: Payer: Self-pay | Admitting: Family Medicine

## 2023-06-25 ENCOUNTER — Telehealth: Payer: Self-pay | Admitting: Family Medicine

## 2023-06-25 DIAGNOSIS — E119 Type 2 diabetes mellitus without complications: Secondary | ICD-10-CM

## 2023-06-25 MED ORDER — METFORMIN HCL 1000 MG PO TABS
1000.0000 mg | ORAL_TABLET | Freq: Two times a day (BID) | ORAL | 3 refills | Status: DC
Start: 2023-06-25 — End: 2024-03-04

## 2023-06-25 MED ORDER — RYBELSUS 14 MG PO TABS: 14.0000 mg | ORAL_TABLET | Freq: Every day | ORAL | 1 refills | Status: DC

## 2023-06-25 NOTE — Telephone Encounter (Signed)
Called Pt to inform him about the medication updates per Dr Adriana Simas. Pt did not answer, left vm for callback

## 2023-06-25 NOTE — Telephone Encounter (Signed)
Patient was prescribe  semaglutide 14 mg but it will cost [phone] with his insurance. He is wanting to if there is anything cheaper he can take. [facility] [address]

## 2023-07-02 NOTE — Telephone Encounter (Signed)
Called PT and informed PT of medication update. PT verbalized understanding

## 2023-07-15 ENCOUNTER — Other Ambulatory Visit: Payer: Self-pay | Admitting: Family Medicine

## 2023-07-15 DIAGNOSIS — I10 Essential (primary) hypertension: Secondary | ICD-10-CM

## 2023-07-26 ENCOUNTER — Other Ambulatory Visit: Payer: Self-pay | Admitting: Family Medicine

## 2023-08-05 ENCOUNTER — Ambulatory Visit: Payer: Medicare Other | Attending: Cardiology | Admitting: Cardiology

## 2023-08-05 ENCOUNTER — Encounter: Payer: Self-pay | Admitting: Cardiology

## 2023-08-05 VITALS — BP 138/80 | HR 72 | Ht 71.0 in | Wt 240.6 lb

## 2023-08-05 DIAGNOSIS — I5022 Chronic systolic (congestive) heart failure: Secondary | ICD-10-CM | POA: Diagnosis not present

## 2023-08-05 DIAGNOSIS — I10 Essential (primary) hypertension: Secondary | ICD-10-CM | POA: Insufficient documentation

## 2023-08-05 DIAGNOSIS — E782 Mixed hyperlipidemia: Secondary | ICD-10-CM | POA: Insufficient documentation

## 2023-08-05 MED ORDER — LOSARTAN POTASSIUM 50 MG PO TABS: 50.0000 mg | ORAL_TABLET | Freq: Every day | ORAL | 6 refills | Status: DC

## 2023-08-05 NOTE — Progress Notes (Signed)
Clinical Summary Jeremy Rivera is a 72 y.o.male seen today for follow up of the following medical problems.    1. Chronic systolic HF - echo 06/2015 LVEF 40%, grade II diastolic dysfunction.   - echo Jan 2017 LVEF 40-45%, grade I diastolic dysfunction - 02/2016 nuclear stress moderate inferior defect probable attenuation, cannot rule out scar. No ischemia.  01/2023 echo: LVEF 45%,    - jardiance too expensive - reports paying over $300 for 3 month supply of entresto which is too much for him   - no SOB/DOE, no LE edema - compliant with meds    2. HTN - he is compliant with meds - home bp's 120s/70s   3. OSA  -10/2016 severe OSA by sleep study. He is down 30 lbs since that time - has not been wearing CPAP - not interested in reconsidering cpap     4. Hyperlipidmia -11/2022 TC 133 TG 173 HDL 38 LDL 66 05/2023 TC 120 TG 189 HDL 39 LDL 50 - he is on rosuvatatin 10mg      Past Medical History:  Diagnosis Date   Diabetes mellitus without complication (HCC)    Diastolic dysfunction    grade 2   ED (erectile dysfunction)    Herniated lumbar intervertebral disc    Hypertension    Irregular heart beat    a. Pt does not know official diagnosis.   Prostate hypertrophy      No Known Allergies   Current Outpatient Medications  Medication Sig Dispense Refill   Accu-Chek Softclix Lancets lancets TEST BLOOD SUGAR ONCE DAILY AS DIRECTED DX E11.9 100 each 5   aspirin EC 81 MG tablet Take 325 mg by mouth daily.     blood glucose meter kit and supplies Dispense based on patient and insurance preference. Use up to four times daily as directed. (FOR ICD-10 E10.9, E11.9). 1 each 0   chlorthalidone (HYGROTON) 25 MG tablet Take 1 tablet (25 mg total) by mouth daily. 90 tablet 3   finasteride (PROSCAR) 5 MG tablet Take 1 tablet (5 mg total) by mouth daily. 90 tablet 3   glipiZIDE (GLUCOTROL) 5 MG tablet TAKE 2 TABLETS (10 MG TOTAL) BY MOUTH 2 (TWO) TIMES DAILY BEFORE A MEAL. 360  tablet 0   glucose blood (ONE TOUCH ULTRA TEST) test strip TEST BLOOD SUGAR ONCE DAILY AS DIRECTED **DX E11.9 100 each 4   metFORMIN (GLUCOPHAGE) 1000 MG tablet Take 1 tablet (1,000 mg total) by mouth 2 (two) times daily with a meal. 180 tablet 3   metoprolol succinate (TOPROL XL) 25 MG 24 hr tablet Take 0.5 tablets (12.5 mg total) by mouth daily. 15 tablet 6   rosuvastatin (CRESTOR) 10 MG tablet TAKE 1 TABLET BY MOUTH EVERY DAY 90 tablet 0   sacubitril-valsartan (ENTRESTO) 24-26 MG Take 1 tablet by mouth 2 (two) times daily. 60 tablet 6   Semaglutide (RYBELSUS) 14 MG TABS Take 1 tablet (14 mg total) by mouth daily at 6 (six) AM. 90 tablet 1   spironolactone (ALDACTONE) 25 MG tablet TAKE 1 TABLET (25 MG TOTAL) BY MOUTH DAILY. 90 tablet 0   triamcinolone cream (KENALOG) 0.1 % APPLY THIN LAYER TWICE DAILY TO RASH 60 g 0   No current facility-administered medications for this visit.     Past Surgical History:  Procedure Laterality Date   BACK SURGERY  1994 Dr. Lesly Rivera SURGERY  1994 Dr. Roxan Rivera   COLONOSCOPY     one polyp  LUMBAR LAMINECTOMY/DECOMPRESSION MICRODISCECTOMY Left 06/22/2014   Procedure: LUMBAR LAMINECTOMY/DECOMPRESSION MICRODISCECTOMY 1 LEVEL;  Surgeon: Jeremy Hurt, MD;  Location: [facility];  Service: Neurosurgery;  Laterality: Left;  Redo Left Lumbar Three-Four Microdiskectomy     No Known Allergies    Family History  Problem Relation Age of Onset   Hypertension Mother    Diabetes Mother    Transient ischemic attack Mother        Died of ministrokes at 21   Depression Father        Died of "grieving"   COPD Brother        Died of COPD   Throat cancer Sister        Died of throat CA   Colon cancer Neg Hx      Social History Mr. Bistline reports that he has never smoked. He has never used smokeless tobacco. Mr. Delashmutt reports no history of alcohol use.   Review of Systems CONSTITUTIONAL: No weight loss, fever, chills, weakness or fatigue.   HEENT: Eyes: No visual loss, blurred vision, double vision or yellow sclerae.No hearing loss, sneezing, congestion, runny nose or sore throat.  SKIN: No rash or itching.  CARDIOVASCULAR: per hpi RESPIRATORY: No shortness of breath, cough or sputum.  GASTROINTESTINAL: No anorexia, nausea, vomiting or diarrhea. No abdominal pain or blood.  GENITOURINARY: No burning on urination, no polyuria NEUROLOGICAL: No headache, dizziness, syncope, paralysis, ataxia, numbness or tingling in the extremities. No change in bowel or bladder control.  MUSCULOSKELETAL: No muscle, back pain, joint pain or stiffness.  LYMPHATICS: No enlarged nodes. No history of splenectomy.  PSYCHIATRIC: No history of depression or anxiety.  ENDOCRINOLOGIC: No reports of sweating, cold or heat intolerance. No polyuria or polydipsia.  Marland Kitchen   Physical Examination Today's Vitals   08/05/23 0829  BP: (!) 148/98  Pulse: 72  SpO2: 98%  Weight: 240 lb 9.6 oz (109.1 kg)  Height: 5\' 11"  (1.803 m)  PainSc: 0-No pain   Body mass index is 33.56 kg/m.  Gen: resting comfortably, no acute distress HEENT: no scleral icterus, pupils equal round and reactive, no palptable cervical adenopathy,  CV: RRR, no mrg, no jvd Resp: Clear to auscultation bilaterally GI: abdomen is soft, non-tender, non-distended, normal bowel sounds, no hepatosplenomegaly MSK: extremities are warm, no edema.  Skin: warm, no rash Neuro:  no focal deficits Psych: appropriate affect   Diagnostic Studies  06/2015 echo Study Conclusions  - Left ventricle: The cavity size was normal. Wall thickness was   increased in a pattern of mild LVH. Systolic function was mildly   to moderately reduced. The estimated ejection fraction was = 40%.   No regional wall motion abnormalities. Features are consistent   with a pseudonormal left ventricular filling pattern, with   concomitant abnormal relaxation and increased filling pressure   (grade 2 diastolic  dysfunction). - Aortic valve: Mildly calcified annulus. Trileaflet; mildly   thickened leaflets. Valve area (VTI): 2.41 cm^2. Valve area   (Vmax): 2.49 cm^2. - Mitral valve: There was mild regurgitation. - Left atrium: The atrium was moderately dilated. - Pulmonary veins: There is blunting of systolic pulmonary vein   flow consistent with elevated LA pressures. - Atrial septum: No defect or patent foramen ovale was identified. - Technically difficult study. Echo contrast was used to enhance   visualization.   Jan 2017 echo Study Conclusions  - Left ventricle: The cavity size was normal. Wall thickness was   increased in a pattern of mild LVH. Systolic  function was mildly   to moderately reduced. The estimated ejection fraction was in the   range of 40% to 45%. Diffuse hypokinesis. Doppler parameters are   consistent with abnormal left ventricular relaxation (grade 1   diastolic dysfunction). Normal filling pressures. - Aortic valve: Mildly calcified annulus. Trileaflet; mildly   calcified leaflets. - Mitral valve: There was trivial regurgitation. - Left atrium: The atrium was mildly dilated. - Right atrium: Central venous pressure (est): 3 mm Hg. - Tricuspid valve: There was trivial regurgitation. - Pulmonary arteries: Systolic pressure could not be accurately   estimated. - Pericardium, extracardiac: There was no pericardial effusion.  Impressions:  - Mild LVH with LVEF approximately 40-45%, diffuse hypokinesis with   regional variation. Grade 1 diastolic dysfunction with normal   estimated filling pressures. Mild left atrial enlargement.   Trivial mitral regurgitation. Mildly sclerotic aortic valve.   Trivial tricuspid regurgitation, PASP not estimated.   02/2016 Exercise MPI Equivocal ST segment abnormalities noted in leads II, III, aVF, and V4 through V6 in the absence of chest pain. Suboptimal heart rate response achieved on treadmill, Lexiscan utilized. Occasional to  frequent PVCs. Blood pressure demonstrated a hypertensive response to exercise. Moderate-sized, moderate intensity, inferior and basal inferoseptal defect that is fixed and most consistent with soft tissue attenuation, although scar is also possible. This is a high risk study based on calculated LVEF. Nuclear stress EF: 28%.     01/2023 echo 1. Left ventricular ejection fraction, by estimation, is 45%. The left  ventricle has mildly decreased function. The left ventricle has no  regional wall motion abnormalities. Indeterminate diastolic filling due to  E-A fusion. The average left ventricular   global longitudinal strain is -14.2 %. The global longitudinal strain is  abnormal.   2. Right ventricular systolic function is normal. The right ventricular  size is normal.   3. The mitral valve is abnormal. No evidence of mitral valve  regurgitation. No evidence of mitral stenosis. Moderate mitral annular  calcification.   4. The aortic valve is tricuspid. There is mild calcification of the  aortic valve. Aortic valve regurgitation is not visualized. No aortic  stenosis is present.   5. The inferior vena cava is normal in size with greater than 50%  respiratory variability, suggesting right atrial pressure of 3 mmHg.    Assessment and Plan   1. Chronic HFmrEF - LVEF 45%, - noninvasive ischemic testing without clear evidence of ICM - denies recent symptoms - jardiance too expensive. Entresto currently too expensive, change to losartan 50mg  daily  2. HTN - elevated here but home numbers at goal - update [place] on home bp's 1 week  3. Hyperlipidemia - LDL at goal, discussed dietary changes to lower TGs  F/u 6 months     Jeremy Rivera, M.D.

## 2023-08-05 NOTE — Patient Instructions (Addendum)
Medication Instructions:   Stop Entresto Begin Losartan 50mg  daily  Continue all other medications.     Labwork:  none  Testing/Procedures:  none  Follow-Up:  6 months   Any Other Special Instructions Will Be Listed Below (If Applicable).  Your physician has requested that you regularly monitor and record your blood pressure readings at home x 1 week.   Please use the same machine at the same time of day to check your readings and return to office for provider review.   If you need a refill on your cardiac medications before your next appointment, please call your pharmacy.

## 2023-08-29 ENCOUNTER — Other Ambulatory Visit: Payer: Self-pay | Admitting: Cardiology

## 2023-09-03 DIAGNOSIS — Z23 Encounter for immunization: Secondary | ICD-10-CM | POA: Diagnosis not present

## 2023-09-05 ENCOUNTER — Ambulatory Visit (INDEPENDENT_AMBULATORY_CARE_PROVIDER_SITE_OTHER): Payer: Medicare Other | Admitting: Family Medicine

## 2023-09-05 ENCOUNTER — Encounter: Payer: Self-pay | Admitting: Family Medicine

## 2023-09-05 VITALS — BP 151/88 | HR 81 | Temp 98.5°F | Wt 240.2 lb

## 2023-09-05 DIAGNOSIS — E782 Mixed hyperlipidemia: Secondary | ICD-10-CM

## 2023-09-05 DIAGNOSIS — I10 Essential (primary) hypertension: Secondary | ICD-10-CM | POA: Diagnosis not present

## 2023-09-05 DIAGNOSIS — E119 Type 2 diabetes mellitus without complications: Secondary | ICD-10-CM

## 2023-09-05 DIAGNOSIS — I5042 Chronic combined systolic (congestive) and diastolic (congestive) heart failure: Secondary | ICD-10-CM | POA: Diagnosis not present

## 2023-09-05 DIAGNOSIS — Z7984 Long term (current) use of oral hypoglycemic drugs: Secondary | ICD-10-CM | POA: Diagnosis not present

## 2023-09-05 NOTE — Patient Instructions (Signed)
Lab first of the week.  Continue your medications.  Follow up in 3-6 months.

## 2023-09-08 MED ORDER — RYBELSUS 14 MG PO TABS: 14.0000 mg | ORAL_TABLET | Freq: Every day | ORAL | 1 refills | Status: DC

## 2023-09-08 MED ORDER — CHLORTHALIDONE 25 MG PO TABS
25.0000 mg | ORAL_TABLET | Freq: Every day | ORAL | 3 refills | Status: DC
Start: 2023-09-08 — End: 2024-09-06

## 2023-09-08 NOTE — Assessment & Plan Note (Signed)
Awaiting A1c.  Continue current medications.

## 2023-09-08 NOTE — Assessment & Plan Note (Signed)
Stable.  Continue current medications.

## 2023-09-08 NOTE — Assessment & Plan Note (Signed)
At goal on Crestor. 

## 2023-09-08 NOTE — Progress Notes (Signed)
Subjective:  Patient ID: Jeremy Rivera, male    DOB: [birth year]  Age: 72 y.o. MRN: [id number]  CC: Follow up   HPI:  72 year old male with congestive heart failure, hypertension, type diabetes, hyperlipidemia presents for follow-up.  Patient reports that overall he is doing well.  He could not afford Jardiance and Entresto.  Needs A1c.  He is currently on glipizide, Rybelsus, and metformin.  BP mildly elevated here today.  He is on losartan, metoprolol, spironolactone, chlorthalidone.  Lipids have been at goal on Crestor.  Patient Active Problem List   Diagnosis Date Noted   Type 2 diabetes mellitus (HCC) 01/21/2022   Family history of prostate cancer 04/10/2021   Chronic combined systolic and diastolic CHF, NYHA class 2 (HCC) 07/14/2015   Essential hypertension, benign 07/25/2013   Mixed hyperlipidemia 07/25/2013   Erectile dysfunction 07/25/2013    Social Hx   Social History   Socioeconomic History   Marital status: Married    Spouse name: Delphine   Number of children: 3   Years of education: Not on file   Highest education level: Not on file  Occupational History   Not on file  Tobacco Use   Smoking status: Never   Smokeless tobacco: Never  Substance and Sexual Activity   Alcohol use: No    Alcohol/week: 0.0 standard drinks of alcohol   Drug use: No   Sexual activity: Yes    Partners: Female  Other Topics Concern   Not on file  Social History Narrative   Retired from [employer].   1 son, 2 daughters.   6 grandchildren.   Social Determinants of Health   Financial Resource Strain: Low Risk  (02/28/2023)   Overall Financial Resource Strain (CARDIA)    Difficulty of Paying Living Expenses: Not hard at all  Food Insecurity: No Food Insecurity (02/28/2023)   Hunger Vital Sign    Worried About Running Out of Food in the Last Year: Never true    Ran Out of Food in the Last Year: Never true  Transportation Needs: No Transportation Needs (02/28/2023)   PRAPARE -  Administrator, Civil Service (Medical): No    Lack of Transportation (Non-Medical): No  Physical Activity: Insufficiently Active (02/28/2023)   Exercise Vital Sign    Days of Exercise per Week: 3 days    Minutes of Exercise per Session: 30 min  Stress: No Stress Concern Present (02/28/2023)   [employer] of Occupational Health - Occupational Stress Questionnaire    Feeling of Stress : Not at all  Social Connections: Socially Integrated (02/28/2023)   Social Connection and Isolation Panel [NHANES]    Frequency of Communication with Friends and Family: More than three times a week    Frequency of Social Gatherings with Friends and Family: More than three times a week    Attends Religious Services: More than 4 times per year    Active Member of [employer] or Organizations: Yes    Attends Engineer, structural: More than 4 times per year    Marital Status: Married    Review of Systems Per HPI  Objective:  BP (!) 151/88   Pulse 81   Temp 98.5 F (36.9 C) (Oral)   Wt 240 lb 3.2 oz (109 kg)   SpO2 99%   BMI 33.50 kg/m      09/05/2023   11:12 AM 08/05/2023    8:52 AM 08/05/2023    8:29 AM  BP/Weight  Systolic BP 151 138  148  Diastolic BP 88 80 98  Wt. (Lbs) 240.2  240.6  BMI 33.5 kg/m2  33.56 kg/m2    Physical Exam Vitals reviewed.  Constitutional:      General: He is not in acute distress.    Appearance: Normal appearance.  HENT:     Head: Normocephalic and atraumatic.  Eyes:     General:        Right eye: No discharge.        Left eye: No discharge.     Conjunctiva/sclera: Conjunctivae normal.  Cardiovascular:     Rate and Rhythm: Normal rate and regular rhythm.  Pulmonary:     Effort: Pulmonary effort is normal.     Breath sounds: Normal breath sounds. No wheezing, rhonchi or rales.  Neurological:     Mental Status: He is alert.  Psychiatric:        Mood and Affect: Mood normal.        Behavior: Behavior normal.     Lab Results  Component  Value Date   WBC 8.2 01/21/2022   HGB 14.2 01/21/2022   HCT 43.5 01/21/2022   PLT 237 01/21/2022   GLUCOSE 167 (H) 06/06/2023   CHOL 120 06/06/2023   TRIG 189 (H) 06/06/2023   HDL 39 (L) 06/06/2023   LDLCALC 50 06/06/2023   ALT 17 06/06/2023   AST 19 06/06/2023   NA 136 06/06/2023   K 5.0 06/06/2023   CL 96 06/06/2023   CREATININE 1.07 06/06/2023   BUN 17 06/06/2023   CO2 24 06/06/2023   TSH 1.382 07/12/2015   PSA 6.47 (H) 04/05/2014   HGBA1C 7.6 (H) 06/06/2023   MICROALBUR 3.2 (H) 01/03/2015     Assessment & Plan:   Problem List Items Addressed This Visit       Cardiovascular and Mediastinum   Essential hypertension, benign    Stable.  Continue current medications.      Relevant Medications   chlorthalidone (HYGROTON) 25 MG tablet   Chronic combined systolic and diastolic CHF, NYHA class 2 (HCC)    Euvolemic.  Continue current medications.      Relevant Medications   chlorthalidone (HYGROTON) 25 MG tablet     Endocrine   Type 2 diabetes mellitus (HCC) - Primary    Awaiting A1c.  Continue current medications.      Relevant Medications   Semaglutide (RYBELSUS) 14 MG TABS   Other Relevant Orders   Hemoglobin A1c     Other   Mixed hyperlipidemia    At goal on Crestor.      Relevant Medications   chlorthalidone (HYGROTON) 25 MG tablet    Meds ordered this encounter  Medications   chlorthalidone (HYGROTON) 25 MG tablet    Sig: Take 1 tablet (25 mg total) by mouth daily.    Dispense:  90 tablet    Refill:  3   Semaglutide (RYBELSUS) 14 MG TABS    Sig: Take 1 tablet (14 mg total) by mouth daily at 6 (six) AM.    Dispense:  90 tablet    Refill:  1    Follow-up:  3-6 months  Tibor Lemmons Adriana Simas DO [facility]

## 2023-09-08 NOTE — Assessment & Plan Note (Signed)
Euvolemic. Continue current medications 

## 2023-09-14 ENCOUNTER — Other Ambulatory Visit: Payer: Self-pay | Admitting: Family Medicine

## 2023-09-14 DIAGNOSIS — E119 Type 2 diabetes mellitus without complications: Secondary | ICD-10-CM

## 2023-09-14 DIAGNOSIS — I10 Essential (primary) hypertension: Secondary | ICD-10-CM

## 2023-09-14 DIAGNOSIS — I5042 Chronic combined systolic (congestive) and diastolic (congestive) heart failure: Secondary | ICD-10-CM

## 2023-09-23 DIAGNOSIS — E119 Type 2 diabetes mellitus without complications: Secondary | ICD-10-CM | POA: Diagnosis not present

## 2023-09-24 LAB — HEMOGLOBIN A1C
Est. average glucose Bld gHb Est-mCnc: 163 mg/dL
Hgb A1c MFr Bld: 7.3 % — ABNORMAL HIGH (ref 4.8–5.6)

## 2023-10-25 ENCOUNTER — Other Ambulatory Visit: Payer: Self-pay | Admitting: Family Medicine

## 2023-11-06 DIAGNOSIS — N390 Urinary tract infection, site not specified: Secondary | ICD-10-CM | POA: Diagnosis not present

## 2023-11-06 DIAGNOSIS — R3915 Urgency of urination: Secondary | ICD-10-CM | POA: Diagnosis not present

## 2023-11-06 DIAGNOSIS — N50812 Left testicular pain: Secondary | ICD-10-CM | POA: Diagnosis not present

## 2023-11-06 DIAGNOSIS — N401 Enlarged prostate with lower urinary tract symptoms: Secondary | ICD-10-CM | POA: Diagnosis not present

## 2023-11-10 ENCOUNTER — Other Ambulatory Visit: Payer: Self-pay | Admitting: Psychiatry

## 2023-11-10 DIAGNOSIS — N50812 Left testicular pain: Secondary | ICD-10-CM

## 2023-11-11 ENCOUNTER — Ambulatory Visit
Admission: RE | Admit: 2023-11-11 | Discharge: 2023-11-11 | Disposition: A | Discharge status: Home / Self Care | Payer: Medicare Other | Source: Ambulatory Visit | Attending: Psychiatry | Admitting: Psychiatry

## 2023-11-11 DIAGNOSIS — N50812 Left testicular pain: Secondary | ICD-10-CM

## 2023-11-11 DIAGNOSIS — N503 Cyst of epididymis: Secondary | ICD-10-CM | POA: Diagnosis not present

## 2023-11-11 DIAGNOSIS — N433 Hydrocele, unspecified: Secondary | ICD-10-CM | POA: Diagnosis not present

## 2023-11-21 DIAGNOSIS — N50812 Left testicular pain: Secondary | ICD-10-CM | POA: Diagnosis not present

## 2023-11-21 DIAGNOSIS — N401 Enlarged prostate with lower urinary tract symptoms: Secondary | ICD-10-CM | POA: Diagnosis not present

## 2023-11-21 DIAGNOSIS — N451 Epididymitis: Secondary | ICD-10-CM | POA: Diagnosis not present

## 2023-11-21 DIAGNOSIS — R3915 Urgency of urination: Secondary | ICD-10-CM | POA: Diagnosis not present

## 2023-12-07 ENCOUNTER — Other Ambulatory Visit: Payer: Self-pay | Admitting: Cardiology

## 2023-12-12 ENCOUNTER — Other Ambulatory Visit: Payer: Self-pay | Admitting: Family Medicine

## 2023-12-12 DIAGNOSIS — E119 Type 2 diabetes mellitus without complications: Secondary | ICD-10-CM

## 2023-12-12 DIAGNOSIS — I5042 Chronic combined systolic (congestive) and diastolic (congestive) heart failure: Secondary | ICD-10-CM

## 2023-12-12 DIAGNOSIS — I10 Essential (primary) hypertension: Secondary | ICD-10-CM

## 2023-12-17 LAB — HM DIABETES EYE EXAM

## 2024-01-23 ENCOUNTER — Other Ambulatory Visit: Payer: Self-pay

## 2024-01-23 ENCOUNTER — Other Ambulatory Visit: Payer: Self-pay | Admitting: Family Medicine

## 2024-01-23 DIAGNOSIS — E782 Mixed hyperlipidemia: Secondary | ICD-10-CM

## 2024-02-11 ENCOUNTER — Ambulatory Visit: Payer: Medicare Other | Attending: Cardiology | Admitting: Cardiology

## 2024-02-11 ENCOUNTER — Encounter: Payer: Self-pay | Admitting: Cardiology

## 2024-02-11 VITALS — BP 130/76 | HR 80 | Ht 71.0 in | Wt 234.4 lb

## 2024-02-11 DIAGNOSIS — I5022 Chronic systolic (congestive) heart failure: Secondary | ICD-10-CM | POA: Insufficient documentation

## 2024-02-11 DIAGNOSIS — E782 Mixed hyperlipidemia: Secondary | ICD-10-CM | POA: Diagnosis not present

## 2024-02-11 DIAGNOSIS — I10 Essential (primary) hypertension: Secondary | ICD-10-CM | POA: Insufficient documentation

## 2024-02-11 NOTE — Progress Notes (Signed)
 Clinical Summary Mr. Cupo is a 73 y.o.male seen today for follow up of the following medical problems.    1. Chronic systolic HF - echo 06/2015 LVEF 40%, grade II diastolic dysfunction.   - echo Jan 2017 LVEF 40-45%, grade I diastolic dysfunction - 02/2016 nuclear stress moderate inferior defect probable attenuation, cannot rule out scar. No ischemia.  01/2023 echo: LVEF 45%,    - jardiance too expensive. Entresto too expensive, changed to losartan.   - no SOB/DOE, no recent edema - compliant with meds        2. HTN - he is compliant with meds - home bp's 120s/70s   3. OSA  -10/2016 severe OSA by sleep study. He is down 30 lbs since that time - has not been wearing CPAP - not interested in reconsidering cpap       4. Hyperlipidmia -11/2022 TC 133 TG 173 HDL 38 LDL 66 05/2023 TC 120 TG 189 HDL 39 LDL 50 - he is on rosuvatatin 10mg   Past Medical History:  Diagnosis Date   Diabetes mellitus without complication (HCC)    Diastolic dysfunction    grade 2   ED (erectile dysfunction)    Herniated lumbar intervertebral disc    Hypertension    Irregular heart beat    a. Pt does not know official diagnosis.   Prostate hypertrophy      No Known Allergies   Current Outpatient Medications  Medication Sig Dispense Refill   Accu-Chek Softclix Lancets lancets TEST BLOOD SUGAR ONCE DAILY AS DIRECTED DX E11.9 100 each 5   aspirin EC 81 MG tablet Take 325 mg by mouth daily.     blood glucose meter kit and supplies Dispense based on patient and insurance preference. Use up to four times daily as directed. (FOR ICD-10 E10.9, E11.9). 1 each 0   chlorthalidone (HYGROTON) 25 MG tablet Take 1 tablet (25 mg total) by mouth daily. 90 tablet 3   finasteride (PROSCAR) 5 MG tablet Take 1 tablet (5 mg total) by mouth daily. 90 tablet 3   glipiZIDE (GLUCOTROL) 5 MG tablet TAKE 2 TABLETS (10 MG TOTAL) BY MOUTH 2 (TWO) TIMES DAILY BEFORE A MEAL. 360 tablet 0   glucose blood (ONE TOUCH  ULTRA TEST) test strip TEST BLOOD SUGAR ONCE DAILY AS DIRECTED **DX E11.9 100 each 4   losartan (COZAAR) 50 MG tablet TAKE 1 TABLET BY MOUTH EVERY DAY 90 tablet 1   metFORMIN (GLUCOPHAGE) 1000 MG tablet Take 1 tablet (1,000 mg total) by mouth 2 (two) times daily with a meal. 180 tablet 3   metoprolol succinate (TOPROL-XL) 25 MG 24 hr tablet TAKE 1/2 TABLET BY MOUTH EVERY DAY 45 tablet 2   rosuvastatin (CRESTOR) 10 MG tablet TAKE 1 TABLET BY MOUTH EVERY DAY 90 tablet 0   Semaglutide (RYBELSUS) 14 MG TABS Take 1 tablet (14 mg total) by mouth daily at 6 (six) AM. 90 tablet 1   spironolactone (ALDACTONE) 25 MG tablet TAKE 1 TABLET (25 MG TOTAL) BY MOUTH DAILY. 90 tablet 0   triamcinolone cream (KENALOG) 0.1 % APPLY THIN LAYER TWICE DAILY TO RASH 60 g 0   No current facility-administered medications for this visit.     Past Surgical History:  Procedure Laterality Date   BACK SURGERY  1994 Dr. Lesly Rubenstein SURGERY  1994 Dr. Roxan Hockey   COLONOSCOPY     one polyp   LUMBAR LAMINECTOMY/DECOMPRESSION MICRODISCECTOMY Left 06/22/2014   Procedure: LUMBAR LAMINECTOMY/DECOMPRESSION MICRODISCECTOMY  1 LEVEL;  Surgeon: Carmela Hurt, MD;  Location: [facility];  Service: Neurosurgery;  Laterality: Left;  Redo Left Lumbar Three-Four Microdiskectomy     No Known Allergies    Family History  Problem Relation Age of Onset   Hypertension Mother    Diabetes Mother    Transient ischemic attack Mother        Died of ministrokes at 49   Depression Father        Died of "grieving"   COPD Brother        Died of COPD   Throat cancer Sister        Died of throat CA   Colon cancer Neg Hx      Social History Mr. Conaty reports that he has never smoked. He has never used smokeless tobacco. Mr. Betancur reports no history of alcohol use.    Marland Kitchen   Physical Examination Today's Vitals   02/11/24 0812  BP: 130/76  Pulse: 80  SpO2: 100%  Weight: 234 lb 6.4 oz (106.3 kg)  Height: 5\' 11"  (1.803 m)    Body mass index is 32.69 kg/m.   Gen: resting comfortably, no acute distress HEENT: no scleral icterus, pupils equal round and reactive, no palptable cervical adenopathy,  CV: RRR, no mrg, no jvd Resp: Clear to auscultation bilaterally GI: abdomen is soft, non-tender, non-distended, normal bowel sounds, no hepatosplenomegaly MSK: extremities are warm, no edema.  Skin: warm, no rash Neuro:  no focal deficits Psych: appropriate affect   Diagnostic Studies  06/2015 echo Study Conclusions  - Left ventricle: The cavity size was normal. Wall thickness was   increased in a pattern of mild LVH. Systolic function was mildly   to moderately reduced. The estimated ejection fraction was = 40%.   No regional wall motion abnormalities. Features are consistent   with a pseudonormal left ventricular filling pattern, with   concomitant abnormal relaxation and increased filling pressure   (grade 2 diastolic dysfunction). - Aortic valve: Mildly calcified annulus. Trileaflet; mildly   thickened leaflets. Valve area (VTI): 2.41 cm^2. Valve area   (Vmax): 2.49 cm^2. - Mitral valve: There was mild regurgitation. - Left atrium: The atrium was moderately dilated. - Pulmonary veins: There is blunting of systolic pulmonary vein   flow consistent with elevated LA pressures. - Atrial septum: No defect or patent foramen ovale was identified. - Technically difficult study. Echo contrast was used to enhance   visualization.   Jan 2017 echo Study Conclusions  - Left ventricle: The cavity size was normal. Wall thickness was   increased in a pattern of mild LVH. Systolic function was mildly   to moderately reduced. The estimated ejection fraction was in the   range of 40% to 45%. Diffuse hypokinesis. Doppler parameters are   consistent with abnormal left ventricular relaxation (grade 1   diastolic dysfunction). Normal filling pressures. - Aortic valve: Mildly calcified annulus. Trileaflet; mildly    calcified leaflets. - Mitral valve: There was trivial regurgitation. - Left atrium: The atrium was mildly dilated. - Right atrium: Central venous pressure (est): 3 mm Hg. - Tricuspid valve: There was trivial regurgitation. - Pulmonary arteries: Systolic pressure could not be accurately   estimated. - Pericardium, extracardiac: There was no pericardial effusion.  Impressions:  - Mild LVH with LVEF approximately 40-45%, diffuse hypokinesis with   regional variation. Grade 1 diastolic dysfunction with normal   estimated filling pressures. Mild left atrial enlargement.   Trivial mitral regurgitation. Mildly sclerotic aortic valve.  Trivial tricuspid regurgitation, PASP not estimated.   02/2016 Exercise MPI Equivocal ST segment abnormalities noted in leads II, III, aVF, and V4 through V6 in the absence of chest pain. Suboptimal heart rate response achieved on treadmill, Lexiscan utilized. Occasional to frequent PVCs. Blood pressure demonstrated a hypertensive response to exercise. Moderate-sized, moderate intensity, inferior and basal inferoseptal defect that is fixed and most consistent with soft tissue attenuation, although scar is also possible. This is a high risk study based on calculated LVEF. Nuclear stress EF: 28%.     01/2023 echo 1. Left ventricular ejection fraction, by estimation, is 45%. The left  ventricle has mildly decreased function. The left ventricle has no  regional wall motion abnormalities. Indeterminate diastolic filling due to  E-A fusion. The average left ventricular   global longitudinal strain is -14.2 %. The global longitudinal strain is  abnormal.   2. Right ventricular systolic function is normal. The right ventricular  size is normal.   3. The mitral valve is abnormal. No evidence of mitral valve  regurgitation. No evidence of mitral stenosis. Moderate mitral annular  calcification.   4. The aortic valve is tricuspid. There is mild calcification of the   aortic valve. Aortic valve regurgitation is not visualized. No aortic  stenosis is present.   5. The inferior vena cava is normal in size with greater than 50%  respiratory variability, suggesting right atrial pressure of 3 mmHg.    Assessment and Plan  1. Chronic HFmrEF - LVEF 45%, - noninvasive ischemic testing without clear evidence of ICM - jardiance too expensive. Entresto currently too expensive, changed to losartan 50mg  - no symptoms, continue current meds  EKG today SR, no acute ischemic changes   2. HTN - at goal, continue current meds   3. Hyperlipidemia - LDL at goal, continue current meds   F/u 6 months      Antoine Poche, M.D.

## 2024-02-11 NOTE — Patient Instructions (Signed)
Medication Instructions:  Continue all current medications.   Labwork: none  Testing/Procedures: none  Follow-Up: 6 months   Any Other Special Instructions Will Be Listed Below (If Applicable).   If you need a refill on your cardiac medications before your next appointment, please call your pharmacy.

## 2024-03-03 ENCOUNTER — Other Ambulatory Visit: Payer: Self-pay | Admitting: Cardiology

## 2024-03-04 ENCOUNTER — Other Ambulatory Visit: Payer: Self-pay | Admitting: Family Medicine

## 2024-03-04 ENCOUNTER — Ambulatory Visit: Payer: 59 | Admitting: Family Medicine

## 2024-03-04 VITALS — BP 136/78 | HR 88 | Temp 97.1°F | Ht 71.0 in | Wt 235.0 lb

## 2024-03-04 DIAGNOSIS — I10 Essential (primary) hypertension: Secondary | ICD-10-CM

## 2024-03-04 DIAGNOSIS — E782 Mixed hyperlipidemia: Secondary | ICD-10-CM

## 2024-03-04 DIAGNOSIS — E1169 Type 2 diabetes mellitus with other specified complication: Secondary | ICD-10-CM

## 2024-03-04 DIAGNOSIS — Z7984 Long term (current) use of oral hypoglycemic drugs: Secondary | ICD-10-CM | POA: Diagnosis not present

## 2024-03-04 DIAGNOSIS — E119 Type 2 diabetes mellitus without complications: Secondary | ICD-10-CM

## 2024-03-04 DIAGNOSIS — I5042 Chronic combined systolic (congestive) and diastolic (congestive) heart failure: Secondary | ICD-10-CM | POA: Diagnosis not present

## 2024-03-04 DIAGNOSIS — Z130 Encounter for screening for diseases of the blood and blood-forming organs and certain disorders involving the immune mechanism: Secondary | ICD-10-CM | POA: Diagnosis not present

## 2024-03-04 MED ORDER — RYBELSUS 14 MG PO TABS: 14.0000 mg | ORAL_TABLET | Freq: Every day | ORAL | 3 refills | Status: DC

## 2024-03-04 MED ORDER — GLIPIZIDE 5 MG PO TABS: 10.0000 mg | ORAL_TABLET | Freq: Two times a day (BID) | ORAL | 3 refills | Status: AC

## 2024-03-04 MED ORDER — METFORMIN HCL 1000 MG PO TABS: 1000.0000 mg | ORAL_TABLET | Freq: Two times a day (BID) | ORAL | 3 refills | Status: AC

## 2024-03-04 NOTE — Patient Instructions (Addendum)
 Labs today.  Last A1c was 7.3.  Follow up in 6 months.  Take care  Dr. Adriana Simas

## 2024-03-04 NOTE — Assessment & Plan Note (Signed)
 Seen today.  Continue Rybelsus, metformin, and glipizide.

## 2024-03-04 NOTE — Assessment & Plan Note (Signed)
 Stable.  Continue losartan, metoprolol, spironolactone, chlorthalidone.

## 2024-03-04 NOTE — Assessment & Plan Note (Signed)
 Lipid panel today. Continue Crestor.

## 2024-03-04 NOTE — Progress Notes (Signed)
 Subjective:  Patient ID: Jeremy Rivera, male    DOB: 09-25-1951  Age: 73 y.o. MRN: [id number]  CC:   Chief Complaint  Patient presents with   Diabetes    HPI:  73 year old male with the below mentioned medical problems presents for follow-up.  CHF stable.  Denies chest pain or shortness of breath.  Most recent echo with EF of 45%.  SGLT2 and Entresto have been too expensive.  Currently on metoprolol, losartan, and spironolactone, and chlorthalidone.  Hypertension stable.  Type 2 diabetes has been fairly well-controlled.  Last A1c 7.3.  He is compliant with metformin, glipizide, and Rybelsus.  Needs foot exam today.  LDL at goal on Crestor.  No reported side effects.  Patient Active Problem List   Diagnosis Date Noted   Type 2 diabetes mellitus (HCC) 01/21/2022   Family history of prostate cancer 04/10/2021   Chronic combined systolic and diastolic CHF, NYHA class 2 (HCC) 07/14/2015   Essential hypertension, benign 07/25/2013   Mixed hyperlipidemia 07/25/2013   Erectile dysfunction 07/25/2013    Social Hx   Social History   Socioeconomic History   Marital status: Married    Spouse name: Delphine   Number of children: 3   Years of education: Not on file   Highest education level: Not on file  Occupational History   Not on file  Tobacco Use   Smoking status: Never   Smokeless tobacco: Never  Substance and Sexual Activity   Alcohol use: No    Alcohol/week: 0.0 standard drinks of alcohol   Drug use: No   Sexual activity: Yes    Partners: Female  Other Topics Concern   Not on file  Social History Narrative   Retired from [employer].   1 son, 2 daughters.   6 grandchildren.   Social Drivers of Corporate investment banker Strain: Low Risk  (02/28/2023)   Overall Financial Resource Strain (CARDIA)    Difficulty of Paying Living Expenses: Not hard at all  Food Insecurity: No Food Insecurity (02/28/2023)   Hunger Vital Sign    Worried About Running Out of Food in  the Last Year: Never true    Ran Out of Food in the Last Year: Never true  Transportation Needs: No Transportation Needs (02/28/2023)   PRAPARE - Administrator, Civil Service (Medical): No    Lack of Transportation (Non-Medical): No  Physical Activity: Insufficiently Active (02/28/2023)   Exercise Vital Sign    Days of Exercise per Week: 3 days    Minutes of Exercise per Session: 30 min  Stress: No Stress Concern Present (02/28/2023)   [employer] of Occupational Health - Occupational Stress Questionnaire    Feeling of Stress : Not at all  Social Connections: Socially Integrated (02/28/2023)   Social Connection and Isolation Panel [NHANES]    Frequency of Communication with Friends and Family: More than three times a week    Frequency of Social Gatherings with Friends and Family: More than three times a week    Attends Religious Services: More than 4 times per year    Active Member of [employer] or Organizations: Yes    Attends Engineer, structural: More than 4 times per year    Marital Status: Married    Review of Systems Per HPI  Objective:  BP 136/78   Pulse 88   Temp (!) 97.1 F (36.2 C)   Ht 5\' 11"  (1.803 m)   Wt 235 lb (106.6 kg)  SpO2 98%   BMI 32.78 kg/m      03/04/2024    9:47 AM 02/11/2024    8:12 AM 09/05/2023   11:12 AM  BP/Weight  Systolic BP 136 130 151  Diastolic BP 78 76 88  Wt. (Lbs) 235 234.4 240.2  BMI 32.78 kg/m2 32.69 kg/m2 33.5 kg/m2    Physical Exam Vitals and nursing note reviewed.  Constitutional:      General: He is not in acute distress.    Appearance: Normal appearance.  HENT:     Head: Normocephalic and atraumatic.  Eyes:     General:        Right eye: No discharge.        Left eye: No discharge.     Conjunctiva/sclera: Conjunctivae normal.  Cardiovascular:     Rate and Rhythm: Normal rate and regular rhythm.  Pulmonary:     Effort: Pulmonary effort is normal.     Breath sounds: Normal breath sounds. No wheezing,  rhonchi or rales.  Feet:     Comments: Diabetic foot exam performed today.  See quality metrics section. Neurological:     Mental Status: He is alert.  Psychiatric:        Mood and Affect: Mood normal.        Behavior: Behavior normal.     Lab Results  Component Value Date   WBC 8.2 01/21/2022   HGB 14.2 01/21/2022   HCT 43.5 01/21/2022   PLT 237 01/21/2022   GLUCOSE 167 (H) 06/06/2023   CHOL 120 06/06/2023   TRIG 189 (H) 06/06/2023   HDL 39 (L) 06/06/2023   LDLCALC 50 06/06/2023   ALT 17 06/06/2023   AST 19 06/06/2023   NA 136 06/06/2023   K 5.0 06/06/2023   CL 96 06/06/2023   CREATININE 1.07 06/06/2023   BUN 17 06/06/2023   CO2 24 06/06/2023   TSH 1.382 07/12/2015   PSA 6.47 (H) 04/05/2014   HGBA1C 7.3 (H) 09/23/2023   MICROALBUR 3.2 (H) 01/03/2015     Assessment & Plan:  Type 2 diabetes mellitus without complication, without long-term current use of insulin (HCC) Assessment & Plan: Seen today.  Continue Rybelsus, metformin, and glipizide.  Orders: -     CMP14+EGFR -     Hemoglobin A1c -     Microalbumin / creatinine urine ratio -     Rybelsus; Take 1 tablet (14 mg total) by mouth daily at 6 (six) AM.  Dispense: 90 tablet; Refill: 3 -     metFORMIN HCl; Take 1 tablet (1,000 mg total) by mouth 2 (two) times daily with a meal.  Dispense: 180 tablet; Refill: 3 -     glipiZIDE; Take 2 tablets (10 mg total) by mouth 2 (two) times daily before a meal.  Dispense: 360 tablet; Refill: 3  Mixed hyperlipidemia Assessment & Plan: Lipid panel today.  Continue Crestor.  Orders: -     Lipid panel  Screening for deficiency anemia  Chronic combined systolic and diastolic CHF, NYHA class 2 (HCC) Assessment & Plan: Euvolemic.  Stable.  Follows with cardiology.  Continue current medications.   Essential hypertension, benign Assessment & Plan: Stable.  Continue losartan, metoprolol, spironolactone, chlorthalidone.     Follow-up:  6 months  Thalia Turkington Adriana Simas  DO [facility]

## 2024-03-04 NOTE — Assessment & Plan Note (Signed)
 Euvolemic.  Stable.  Follows with cardiology.  Continue current medications.

## 2024-03-05 ENCOUNTER — Ambulatory Visit (INDEPENDENT_AMBULATORY_CARE_PROVIDER_SITE_OTHER): Payer: 59

## 2024-03-05 VITALS — BP 136/78 | Ht 71.0 in | Wt 235.0 lb

## 2024-03-05 DIAGNOSIS — Z0000 Encounter for general adult medical examination without abnormal findings: Secondary | ICD-10-CM

## 2024-03-05 LAB — CMP14+EGFR
ALT: 14 IU/L (ref 0–44)
AST: 21 IU/L (ref 0–40)
Albumin: 4.6 g/dL (ref 3.8–4.8)
Alkaline Phosphatase: 62 IU/L (ref 44–121)
BUN/Creatinine Ratio: 17 (ref 10–24)
BUN: 23 mg/dL (ref 8–27)
Bilirubin Total: 0.4 mg/dL (ref 0.0–1.2)
CO2: 23 mmol/L (ref 20–29)
Calcium: 10.3 mg/dL — ABNORMAL HIGH (ref 8.6–10.2)
Chloride: 99 mmol/L (ref 96–106)
Creatinine, Ser: 1.32 mg/dL — ABNORMAL HIGH (ref 0.76–1.27)
Globulin, Total: 2.6 g/dL (ref 1.5–4.5)
Glucose: 161 mg/dL — ABNORMAL HIGH (ref 70–99)
Potassium: 4.7 mmol/L (ref 3.5–5.2)
Sodium: 137 mmol/L (ref 134–144)
Total Protein: 7.2 g/dL (ref 6.0–8.5)
eGFR: 57 mL/min/{1.73_m2} — ABNORMAL LOW (ref >59)

## 2024-03-05 LAB — LIPID PANEL
Chol/HDL Ratio: 3.2 ratio (ref 0.0–5.0)
Cholesterol, Total: 113 mg/dL (ref 100–199)
HDL: 35 mg/dL — ABNORMAL LOW (ref >39)
LDL Chol Calc (NIH): 51 mg/dL (ref 0–99)
Triglycerides: 162 mg/dL — ABNORMAL HIGH (ref 0–149)
VLDL Cholesterol Cal: 27 mg/dL (ref 5–40)

## 2024-03-05 LAB — MICROALBUMIN / CREATININE URINE RATIO
Creatinine, Urine: 139.8 mg/dL
Microalb/Creat Ratio: 13 mg/g{creat} (ref 0–29)
Microalbumin, Urine: 18.1 ug/mL

## 2024-03-05 LAB — HEMOGLOBIN A1C
Est. average glucose Bld gHb Est-mCnc: 157 mg/dL
Hgb A1c MFr Bld: 7.1 % — ABNORMAL HIGH (ref 4.8–5.6)

## 2024-03-05 NOTE — Patient Instructions (Signed)
 Jeremy Rivera , Thank you for taking time to come for your Medicare Wellness Visit. I appreciate your ongoing commitment to your health goals. Please review the following plan we discussed and let me know if I can assist you in the future.   Referrals/Orders/Follow-Ups/Clinician Recommendations:  Next Medicare Annual Wellness Visit:   March 11, 2025 at 10:40 am telephone visit  When you have completed your advanced care planning documents, you may either personally take them into the office or mail/email them to the addresses listed below:    This is a list of the screening recommended for you and due dates:  Health Maintenance  Topic Date Due   DTaP/Tdap/Td vaccine (1 - Tdap) Never done   Zoster (Shingles) Vaccine (2 of 2) 02/15/2018   Medicare Annual Wellness Visit  02/28/2024   COVID-19 Vaccine (7 - 2024-25 season) 03/20/2024*   Yearly kidney health urinalysis for diabetes  06/05/2024   Hemoglobin A1C  09/04/2024   Eye exam for diabetics  11/29/2024   Colon Cancer Screening  01/25/2025   Yearly kidney function blood test for diabetes  03/04/2025   Complete foot exam   03/04/2025   Pneumonia Vaccine  Completed   Flu Shot  Completed   HPV Vaccine  Aged Out   Hepatitis C Screening  Discontinued  *Topic was postponed. The date shown is not the original due date.    Advanced directives: (Provided) Advance directive discussed with you today. I have provided a copy for you to complete at home and have notarized. Once this is complete, please bring a copy in to our office so we can scan it into your chart.   Next Medicare Annual Wellness Visit scheduled for next year: yes  Understanding Your Risk for Falls Millions of people have serious injuries from falls each year. It is important to understand your risk of falling. Talk with your health care provider about your risk and what you can do to lower it. If you do have a serious fall, make sure to tell your provider. Falling once raises your  risk of falling again. How can falls affect me? Serious injuries from falls are common. These include: Broken bones, such as hip fractures. Head injuries, such as traumatic brain injuries (TBI) or concussions. A fear of falling can cause you to avoid activities and stay at home. This can make your muscles weaker and raise your risk for a fall. What can increase my risk? There are a number of risk factors that increase your risk for falling. The more risk factors you have, the higher your risk of falling. Serious injuries from a fall happen most often to people who are older than 75 years old. Teenagers and young adults ages 73-29 are also at higher risk. Common risk factors include: Weakness in the lower body. Being generally weak or confused due to long-term (chronic) illness. Dizziness or balance problems. Poor vision. Medicines that cause dizziness or drowsiness. These may include: Medicines for your blood pressure, heart, anxiety, insomnia, or swelling (edema). Pain medicines. Muscle relaxants. Other risk factors include: Drinking alcohol. Having had a fall in the past. Having foot pain or wearing improper footwear. Working at a dangerous job. Having any of the following in your home: Tripping hazards, such as floor clutter or loose rugs. Poor lighting. Pets. Having dementia or memory loss. What actions can I take to lower my risk of falling?     Physical activity Stay physically fit. Do strength and balance exercises. Consider taking a  regular class to build strength and balance. Yoga and tai chi are good options. Vision Have your eyes checked every year and your prescription for glasses or contacts updated as needed. Shoes and walking aids Wear non-skid shoes. Wear shoes that have rubber soles and low heels. Do not wear high heels. Do not walk around the house in socks or slippers. Use a cane or walker as told by your provider. Home safety Attach secure railings on  both sides of your stairs. Install grab bars for your bathtub, shower, and toilet. Use a non-skid mat in your bathtub or shower. Attach bath mats securely with double-sided, non-slip rug tape. Use good lighting in all rooms. Keep a flashlight near your bed. Make sure there is a clear path from your bed to the bathroom. Use night-lights. Do not use throw rugs. Make sure all carpeting is taped or tacked down securely. Remove all clutter from walkways and stairways, including extension cords. Repair uneven or broken steps and floors. Avoid walking on icy or slippery surfaces. Walk on the grass instead of on icy or slick sidewalks. Use ice melter to get rid of ice on walkways in the winter. Use a cordless phone. Questions to ask your health care provider Can you help me check my risk for a fall? Do any of my medicines make me more likely to fall? Should I take a vitamin D supplement? What exercises can I do to improve my strength and balance? Should I make an appointment to have my vision checked? Do I need a bone density test to check for weak bones (osteoporosis)? Would it help to use a cane or a walker? Where to find more information Centers for Disease Control and Prevention, STEADI: TonerPromos.no Community-Based Fall Prevention Programs: TonerPromos.no [employer] on Aging: BaseRingTones.pl Contact a health care provider if: You fall at home. You are afraid of falling at home. You feel weak, drowsy, or dizzy. This information is not intended to replace advice given to you by your health care provider. Make sure you discuss any questions you have with your health care provider. Document Revised: 08/19/2022 Document Reviewed: 08/19/2022 Elsevier Patient Education  2024 [employer].

## 2024-03-05 NOTE — Progress Notes (Signed)
 Because this visit was a virtual/telehealth visit,  certain criteria was not obtained, such a blood pressure, CBG if applicable, and timed get up and go. Any medications not marked as "taking" were not mentioned during the medication reconciliation part of the visit. Any vitals not documented were not able to be obtained due to this being a telehealth visit or patient was unable to self-report a recent blood pressure reading due to a lack of equipment at home via telehealth. Vitals that have been documented are verbally provided by the patient.   Subjective:   Jeremy Rivera is a 73 y.o. who presents for a Medicare Wellness preventive visit.  Visit Complete: Virtual I connected with  Jeremy Rivera on 03/05/24 by a audio enabled telemedicine application and verified that I am speaking with the correct person using two identifiers.  Patient Location: Home  Provider Location: Home Office  I discussed the limitations of evaluation and management by telemedicine. The patient expressed understanding and agreed to proceed.  Vital Signs: Because this visit was a virtual/telehealth visit, some criteria may be missing or patient reported. Any vitals not documented were not able to be obtained and vitals that have been documented are patient reported.  VideoDeclined- This patient declined Librarian, academic. Therefore the visit was completed with audio only.  AWV Questionnaire: No: Patient Medicare AWV questionnaire was not completed prior to this visit.  Cardiac Risk Factors include: advanced age (>90men, >31 women);diabetes mellitus;male gender;obesity (BMI >30kg/m2);hypertension     Objective:    Today's Vitals   03/05/24 1151  BP: 136/78  Weight: 235 lb (106.6 kg)  Height: 5\' 11"  (1.803 m)   Body mass index is 32.78 kg/m.     03/05/2024   11:53 AM 02/28/2023    5:09 PM 02/05/2022    2:03 PM 07/12/2015    6:06 PM 01/11/2015   11:09 AM 06/20/2014    3:21 PM   Advanced Directives  Does Patient Have a Medical Advance Directive? No No No No No Patient does not have advance directive;Patient would like information  Would patient like information on creating a medical advance directive? Yes (MAU/Ambulatory/Procedural Areas - Information given) No - Patient declined No - Patient declined No - patient declined information No - patient declined information Advance directive packet given    Current Medications (verified) Outpatient Encounter Medications as of 03/05/2024  Medication Sig   Accu-Chek Softclix Lancets lancets TEST BLOOD SUGAR ONCE DAILY AS DIRECTED DX E11.9   aspirin EC 81 MG tablet Take 325 mg by mouth daily.   blood glucose meter kit and supplies Dispense based on patient and insurance preference. Use up to four times daily as directed. (FOR ICD-10 E10.9, E11.9).   chlorthalidone (HYGROTON) 25 MG tablet Take 1 tablet (25 mg total) by mouth daily.   finasteride (PROSCAR) 5 MG tablet Take 1 tablet (5 mg total) by mouth daily.   glipiZIDE (GLUCOTROL) 5 MG tablet Take 2 tablets (10 mg total) by mouth 2 (two) times daily before a meal.   glucose blood (ONE TOUCH ULTRA TEST) test strip TEST BLOOD SUGAR ONCE DAILY AS DIRECTED **DX E11.9   losartan (COZAAR) 50 MG tablet TAKE 1 TABLET BY MOUTH EVERY DAY   metFORMIN (GLUCOPHAGE) 1000 MG tablet Take 1 tablet (1,000 mg total) by mouth 2 (two) times daily with a meal.   metoprolol succinate (TOPROL-XL) 25 MG 24 hr tablet TAKE 1/2 TABLET BY MOUTH EVERY DAY   rosuvastatin (CRESTOR) 10 MG tablet TAKE  1 TABLET BY MOUTH EVERY DAY   Semaglutide (RYBELSUS) 14 MG TABS Take 1 tablet (14 mg total) by mouth daily at 6 (six) AM.   spironolactone (ALDACTONE) 25 MG tablet TAKE 1 TABLET (25 MG TOTAL) BY MOUTH DAILY.   tamsulosin (FLOMAX) 0.4 MG CAPS capsule Take 0.4 mg by mouth daily.   No facility-administered encounter medications on file as of 03/05/2024.    Allergies (verified) Patient has no known allergies.    History: Past Medical History:  Diagnosis Date   Diabetes mellitus without complication (HCC)    Diastolic dysfunction    grade 2   ED (erectile dysfunction)    Herniated lumbar intervertebral disc    Hypertension    Irregular heart beat    a. Pt does not know official diagnosis.   Prostate hypertrophy    Past Surgical History:  Procedure Laterality Date   BACK SURGERY  1994 Dr. Lesly Rubenstein SURGERY  1994 Dr. Roxan Hockey   COLONOSCOPY     one polyp   LUMBAR LAMINECTOMY/DECOMPRESSION MICRODISCECTOMY Left 06/22/2014   Procedure: LUMBAR LAMINECTOMY/DECOMPRESSION MICRODISCECTOMY 1 LEVEL;  Surgeon: Carmela Hurt, MD;  Location: [facility];  Service: Neurosurgery;  Laterality: Left;  Redo Left Lumbar Three-Four Microdiskectomy   Family History  Problem Relation Age of Onset   Hypertension Mother    Diabetes Mother    Transient ischemic attack Mother        Died of ministrokes at 83   Depression Father        Died of "grieving"   COPD Brother        Died of COPD   Throat cancer Sister        Died of throat CA   Colon cancer Neg Hx    Social History   Socioeconomic History   Marital status: Married    Spouse name: Delphine   Number of children: 3   Years of education: Not on file   Highest education level: Not on file  Occupational History   Not on file  Tobacco Use   Smoking status: Never   Smokeless tobacco: Never  Substance and Sexual Activity   Alcohol use: No    Alcohol/week: 0.0 standard drinks of alcohol   Drug use: No   Sexual activity: Yes    Partners: Female  Other Topics Concern   Not on file  Social History Narrative   Retired from [employer].   1 son, 2 daughters.   6 grandchildren.   Social Drivers of Corporate investment banker Strain: Low Risk  (03/05/2024)   Overall Financial Resource Strain (CARDIA)    Difficulty of Paying Living Expenses: Not hard at all  Food Insecurity: No Food Insecurity (03/05/2024)   Hunger Vital Sign    Worried About  Running Out of Food in the Last Year: Never true    Ran Out of Food in the Last Year: Never true  Transportation Needs: No Transportation Needs (03/05/2024)   PRAPARE - Administrator, Civil Service (Medical): No    Lack of Transportation (Non-Medical): No  Physical Activity: Insufficiently Active (03/05/2024)   Exercise Vital Sign    Days of Exercise per Week: 3 days    Minutes of Exercise per Session: 30 min  Stress: No Stress Concern Present (03/05/2024)   [employer] of Occupational Health - Occupational Stress Questionnaire    Feeling of Stress : Not at all  Social Connections: Moderately Integrated (03/05/2024)   Social Connection and Isolation  Panel [NHANES]    Frequency of Communication with Friends and Family: More than three times a week    Frequency of Social Gatherings with Friends and Family: More than three times a week    Attends Religious Services: More than 4 times per year    Active Member of [employer] or Organizations: No    Attends Engineer, structural: Never    Marital Status: Married    Tobacco Counseling Counseling given: Yes    Clinical Intake:  Pre-visit preparation completed: Yes  Pain : No/denies pain     BMI - recorded: 32.78 Nutritional Risks: None Diabetes: Yes CBG done?: No Did pt. bring in CBG monitor from home?: No  How often do you need to have someone help you when you read instructions, pamphlets, or other written materials from your doctor or pharmacy?: 1 - Never  Interpreter Needed?: No  Information entered by :: A Aizley Stenseth, CMA   Activities of Daily Living     03/05/2024   11:52 AM  In your present state of health, do you have any difficulty performing the following activities:  Hearing? 0  Vision? 0  Difficulty concentrating or making decisions? 0  Walking or climbing stairs? 0  Dressing or bathing? 0  Doing errands, shopping? 0  Preparing Food and eating ? N  Using the Toilet? N  In the past six months,  have you accidently leaked urine? N  Do you have problems with loss of bowel control? N  Managing your Medications? N  Managing your Finances? N  Housekeeping or managing your Housekeeping? N    Patient Care Team: Tommie Sams, DO as PCP - General (Family Medicine) Wyline Mood Dorothe Pea, MD as PCP - Cardiology (Cardiology) Wyline Mood Dorothe Pea, MD as Consulting Physician (Cardiology) [facility], [facility] Wynetta Emery [facility])  Indicate any recent Medical Services you may have received from other than [facility] providers in the past year (date may be approximate).     Assessment:   This is a routine wellness examination for Jeremy Rivera.  Hearing/Vision screen Hearing Screening - Comments:: Patient denies any hearing difficulties.   Vision Screening - Comments:: Wears rx glasses - up to date with routine eye exams  Sees Wynetta Emery DO at [facility]   Goals Addressed             This Visit's Progress    Patient Stated       Remain active and healthy       Depression Screen     03/05/2024   11:54 AM 03/04/2024    9:52 AM 09/05/2023   11:19 AM 06/06/2023   10:00 AM 02/28/2023    5:08 PM 12/05/2022    9:02 AM 02/05/2022    1:59 PM  PHQ 2/9 Scores  PHQ - 2 Score 0 0 0 0 0 0 0  PHQ- 9 Score 0 0 0 0       Fall Risk     03/05/2024   11:54 AM 03/04/2024    9:52 AM 09/05/2023   11:18 AM 06/06/2023    9:58 AM 02/28/2023    5:07 PM  Fall Risk   Falls in the past year? 0 0 1 1 0  Number falls in past yr: 0 0 0 0 0  Injury with Fall? 0 0 0 0 0  Risk for fall due to : No Fall Risks No Fall Risks   No Fall Risks  Follow up Falls prevention discussed;Falls evaluation completed  Falls evaluation completed   Falls prevention discussed    MEDICARE RISK AT HOME:  Medicare Risk at Home Any stairs in or around the home?: Yes If so, are there any without handrails?: No Home free of loose throw rugs in walkways, pet beds, electrical cords, etc?: Yes Adequate lighting  in your home to reduce risk of falls?: Yes Life alert?: No Use of a cane, walker or w/c?: No Grab bars in the bathroom?: No Shower chair or bench in shower?: Yes Elevated toilet seat or a handicapped toilet?: Yes  TIMED UP AND GO:  Was the test performed?  No  Cognitive Function: 6CIT completed        03/05/2024   11:54 AM 02/28/2023    5:12 PM 02/05/2022    2:06 PM  6CIT Screen  What Year? 0 points 0 points 0 points  What month? 0 points 0 points 0 points  What time? 0 points 0 points 0 points  Count back from 20 0 points 0 points 0 points  Months in reverse 0 points 0 points 0 points  Repeat phrase 0 points 0 points 0 points  Total Score 0 points 0 points 0 points    Immunizations Immunization History  Administered Date(s) Administered   Fluad Quad(high Dose 65+) 09/06/2019, 09/12/2020   Influenza, High Dose Seasonal PF 09/24/2017, 09/30/2018, 09/27/2021   Influenza,inj,Quad PF,6+ Mos 10/03/2015   Influenza-Unspecified 09/30/2018, 09/29/2022, 09/03/2023   Moderna Sars-Covid-2 Vaccination 02/03/2020, 03/06/2020   PFIZER(Purple Top)SARS-COV-2 Vaccination 04/05/2021, 09/27/2021   Pneumococcal Conjugate-13 12/12/2014   Pneumococcal Polysaccharide-23 12/18/2015   Pneumococcal-Unspecified 12/29/2006   RSV,unspecified 11/04/2022   Unspecified SARS-COV-2 Vaccination 10/02/2022, 09/03/2023   Zoster Recombinant(Shingrix) 12/21/2017   Zoster, Live 12/19/2016    Screening Tests Health Maintenance  Topic Date Due   DTaP/Tdap/Td (1 - Tdap) Never done   Zoster Vaccines- Shingrix (2 of 2) 02/15/2018   COVID-19 Vaccine (7 - 2024-25 season) 03/20/2024 (Originally 10/29/2023)   Diabetic kidney evaluation - Urine ACR  06/05/2024   HEMOGLOBIN A1C  09/04/2024   OPHTHALMOLOGY EXAM  11/29/2024   Colonoscopy  01/25/2025   Diabetic kidney evaluation - eGFR measurement  03/04/2025   FOOT EXAM  03/04/2025   Medicare Annual Wellness (AWV)  03/05/2025   Pneumonia Vaccine 68+ Years old   Completed   INFLUENZA VACCINE  Completed   HPV VACCINES  Aged Out   Hepatitis C Screening  Discontinued    Health Maintenance  Health Maintenance Due  Topic Date Due   DTaP/Tdap/Td (1 - Tdap) Never done   Zoster Vaccines- Shingrix (2 of 2) 02/15/2018   Health Maintenance Items Addressed: Health Maintenance is up to date.  Patient advised of vaccines, if any, that are due with verbal understanding  Additional Screening:  Vision Screening: Recommended annual ophthalmology exams for early detection of glaucoma and other disorders of the eye.  Dental Screening: Recommended annual dental exams for proper oral hygiene  Community Resource Referral / Chronic Care Management: CRR required this visit?  No   CCM required this visit?  No     Plan:     I have personally reviewed and noted the following in the patient's chart:   Medical and social history Use of alcohol, tobacco or illicit drugs  Current medications and supplements including opioid prescriptions. Patient is not currently taking opioid prescriptions. Functional ability and status Nutritional status Physical activity Advanced directives List of other physicians Hospitalizations, surgeries, and ER visits in previous 12 months Vitals Screenings to include cognitive, depression,  and falls Referrals and appointments  In addition, I have reviewed and discussed with patient certain preventive protocols, quality metrics, and best practice recommendations. A written personalized care plan for preventive services as well as general preventive health recommendations were provided to patient.     Jordan Hawks Ilona Colley, CMA   03/05/2024   After Visit Summary: (Mail) Due to this being a telephonic visit, the after visit summary with patients personalized plan was offered to patient via mail   Notes: Nothing significant to report at this time.

## 2024-03-15 ENCOUNTER — Other Ambulatory Visit: Payer: Self-pay

## 2024-03-15 DIAGNOSIS — I10 Essential (primary) hypertension: Secondary | ICD-10-CM

## 2024-03-15 DIAGNOSIS — E119 Type 2 diabetes mellitus without complications: Secondary | ICD-10-CM

## 2024-04-17 ENCOUNTER — Other Ambulatory Visit: Payer: Self-pay | Admitting: Family Medicine

## 2024-04-17 DIAGNOSIS — E782 Mixed hyperlipidemia: Secondary | ICD-10-CM

## 2024-04-19 ENCOUNTER — Other Ambulatory Visit: Payer: Self-pay

## 2024-04-19 DIAGNOSIS — E782 Mixed hyperlipidemia: Secondary | ICD-10-CM

## 2024-04-19 MED ORDER — ROSUVASTATIN CALCIUM 10 MG PO TABS: 10.0000 mg | ORAL_TABLET | Freq: Every day | ORAL | 3 refills | Status: AC

## 2024-04-21 DIAGNOSIS — E119 Type 2 diabetes mellitus without complications: Secondary | ICD-10-CM | POA: Diagnosis not present

## 2024-04-21 DIAGNOSIS — I10 Essential (primary) hypertension: Secondary | ICD-10-CM | POA: Diagnosis not present

## 2024-04-22 LAB — BASIC METABOLIC PANEL WITH GFR
BUN/Creatinine Ratio: 19 (ref 10–24)
BUN: 28 mg/dL — ABNORMAL HIGH (ref 8–27)
CO2: 25 mmol/L (ref 20–29)
Calcium: 9.7 mg/dL (ref 8.6–10.2)
Chloride: 99 mmol/L (ref 96–106)
Creatinine, Ser: 1.44 mg/dL — ABNORMAL HIGH (ref 0.76–1.27)
Glucose: 114 mg/dL — ABNORMAL HIGH (ref 70–99)
Potassium: 4.7 mmol/L (ref 3.5–5.2)
Sodium: 138 mmol/L (ref 134–144)
eGFR: 51 mL/min/{1.73_m2} — ABNORMAL LOW (ref >59)

## 2024-05-13 DIAGNOSIS — N401 Enlarged prostate with lower urinary tract symptoms: Secondary | ICD-10-CM | POA: Diagnosis not present

## 2024-05-13 DIAGNOSIS — R972 Elevated prostate specific antigen [PSA]: Secondary | ICD-10-CM | POA: Diagnosis not present

## 2024-05-20 DIAGNOSIS — N400 Enlarged prostate without lower urinary tract symptoms: Secondary | ICD-10-CM | POA: Diagnosis not present

## 2024-05-20 DIAGNOSIS — R972 Elevated prostate specific antigen [PSA]: Secondary | ICD-10-CM | POA: Diagnosis not present

## 2024-05-25 DIAGNOSIS — Z23 Encounter for immunization: Secondary | ICD-10-CM | POA: Diagnosis not present

## 2024-05-30 ENCOUNTER — Other Ambulatory Visit: Payer: Self-pay | Admitting: Cardiology

## 2024-05-31 ENCOUNTER — Other Ambulatory Visit: Payer: Self-pay | Admitting: Family Medicine

## 2024-05-31 DIAGNOSIS — I5042 Chronic combined systolic (congestive) and diastolic (congestive) heart failure: Secondary | ICD-10-CM

## 2024-05-31 DIAGNOSIS — I10 Essential (primary) hypertension: Secondary | ICD-10-CM

## 2024-09-06 ENCOUNTER — Ambulatory Visit (INDEPENDENT_AMBULATORY_CARE_PROVIDER_SITE_OTHER): Admitting: Family Medicine

## 2024-09-06 VITALS — BP 119/74 | HR 81 | Ht 71.0 in | Wt 227.0 lb

## 2024-09-06 DIAGNOSIS — I10 Essential (primary) hypertension: Secondary | ICD-10-CM | POA: Diagnosis not present

## 2024-09-06 DIAGNOSIS — E782 Mixed hyperlipidemia: Secondary | ICD-10-CM | POA: Diagnosis not present

## 2024-09-06 DIAGNOSIS — I5020 Unspecified systolic (congestive) heart failure: Secondary | ICD-10-CM | POA: Diagnosis not present

## 2024-09-06 DIAGNOSIS — E119 Type 2 diabetes mellitus without complications: Secondary | ICD-10-CM

## 2024-09-06 DIAGNOSIS — R7989 Other specified abnormal findings of blood chemistry: Secondary | ICD-10-CM | POA: Diagnosis not present

## 2024-09-06 MED ORDER — CHLORTHALIDONE 25 MG PO TABS: 25.0000 mg | ORAL_TABLET | Freq: Every day | ORAL | 3 refills | Status: AC

## 2024-09-06 MED ORDER — FINASTERIDE 5 MG PO TABS: 5.0000 mg | ORAL_TABLET | Freq: Every day | ORAL | 3 refills | Status: AC

## 2024-09-06 NOTE — Progress Notes (Signed)
 Subjective:  Patient ID: Jeremy Rivera, male    DOB: [birth year]  Age: 73 y.o. MRN: [id number]  CC:   Chief Complaint  Patient presents with   Medical Management of Chronic Issues    HPI:  73 year old male with below mentioned medical problems presents for follow-up.  Patient is doing okay.  He is still grieving following the loss of his wife.  Hypertension stable on metoprolol , spironolactone , chlorthalidone , and losartan .  Follows with cardiology regarding congestive heart failure.  Stable.  As A1c was 7.1.  Needs A1c today.  Remains on metformin  and glipizide .  It does not appear that he has been taking the Rybelsus  per the EMR.  LDL has been at goal.  Last LDL was 51.  Compliant with Crestor .  Patient Active Problem List   Diagnosis Date Noted   HFrEF (heart failure with reduced ejection fraction) (HCC) 09/06/2024   Type 2 diabetes mellitus (HCC) 01/21/2022   Family history of prostate cancer 04/10/2021   Essential hypertension, benign 07/25/2013   Mixed hyperlipidemia 07/25/2013   Erectile dysfunction 07/25/2013    Social Hx   Social History   Socioeconomic History   Marital status: Married    Spouse name: Delphine   Number of children: 3   Years of education: Not on file   Highest education level: Not on file  Occupational History   Not on file  Tobacco Use   Smoking status: Never   Smokeless tobacco: Never  Substance and Sexual Activity   Alcohol use: No    Alcohol/week: 0.0 standard drinks of alcohol   Drug use: No   Sexual activity: Yes    Partners: Female  Other Topics Concern   Not on file  Social History Narrative   Retired from [employer].   1 son, 2 daughters.   6 grandchildren.   Social Drivers of Corporate investment banker Strain: Low Risk  (03/05/2024)   Overall Financial Resource Strain (CARDIA)    Difficulty of Paying Living Expenses: Not hard at all  Food Insecurity: No Food Insecurity (03/05/2024)   Hunger Vital Sign    Worried About  Running Out of Food in the Last Year: Never true    Ran Out of Food in the Last Year: Never true  Transportation Needs: No Transportation Needs (03/05/2024)   PRAPARE - Administrator, Civil Service (Medical): No    Lack of Transportation (Non-Medical): No  Physical Activity: Insufficiently Active (03/05/2024)   Exercise Vital Sign    Days of Exercise per Week: 3 days    Minutes of Exercise per Session: 30 min  Stress: No Stress Concern Present (03/05/2024)   [employer] of Occupational Health - Occupational Stress Questionnaire    Feeling of Stress : Not at all  Social Connections: Moderately Integrated (03/05/2024)   Social Connection and Isolation Panel    Frequency of Communication with Friends and Family: More than three times a week    Frequency of Social Gatherings with Friends and Family: More than three times a week    Attends Religious Services: More than 4 times per year    Active Member of [employer] or Organizations: No    Attends Banker Meetings: Never    Marital Status: Married    Review of Systems Per HPI  Objective:  BP 119/74   Pulse 81   Ht 5' 11 (1.803 m)   Wt 227 lb (103 kg)   SpO2 99%   BMI 31.66 kg/m  09/06/2024    8:43 AM 03/05/2024   11:51 AM 03/04/2024    9:47 AM  BP/Weight  Systolic BP 119 136 136  Diastolic BP 74 78 78  Wt. (Lbs) 227 235 235  BMI 31.66 kg/m2 32.78 kg/m2 32.78 kg/m2    Physical Exam Constitutional:      General: He is not in acute distress.    Appearance: Normal appearance. He is obese.  HENT:     Head: Normocephalic and atraumatic.  Cardiovascular:     Rate and Rhythm: Normal rate and regular rhythm.  Pulmonary:     Effort: Pulmonary effort is normal.     Breath sounds: Normal breath sounds. No wheezing, rhonchi or rales.  Neurological:     Mental Status: He is alert.  Psychiatric:        Mood and Affect: Mood normal.        Behavior: Behavior normal.     Lab Results  Component Value Date    WBC 8.2 01/21/2022   HGB 14.2 01/21/2022   HCT 43.5 01/21/2022   PLT 237 01/21/2022   GLUCOSE 114 (H) 04/21/2024   CHOL 113 03/04/2024   TRIG 162 (H) 03/04/2024   HDL 35 (L) 03/04/2024   LDLCALC 51 03/04/2024   ALT 14 03/04/2024   AST 21 03/04/2024   NA 138 04/21/2024   K 4.7 04/21/2024   CL 99 04/21/2024   CREATININE 1.44 (H) 04/21/2024   BUN 28 (H) 04/21/2024   CO2 25 04/21/2024   TSH 1.382 07/12/2015   PSA 6.47 (H) 04/05/2014   HGBA1C 7.1 (H) 03/04/2024   MICROALBUR 3.2 (H) 01/03/2015     Assessment & Plan:  Type 2 diabetes mellitus without complication, without long-term current use of insulin  (HCC) Assessment & Plan: A1c today to assess.  Has been at goal.  Orders: -     CMP14+EGFR -     Hemoglobin A1c -     Microalbumin / creatinine urine ratio  Mixed hyperlipidemia Assessment & Plan: LDL at goal.  Lipid panel today to assess.  Continue Crestor .  Orders: -     Lipid panel  Elevated serum creatinine -     CBC  Essential hypertension, benign Assessment & Plan: Stable.  Continue current medications.  Orders: -     Chlorthalidone ; Take 1 tablet (25 mg total) by mouth daily.  Dispense: 90 tablet; Refill: 3  HFrEF (heart failure with reduced ejection fraction) (HCC) Assessment & Plan: Stable.  Continue current medications.   Other orders -     Finasteride ; Take 1 tablet (5 mg total) by mouth daily.  Dispense: 90 tablet; Refill: 3    Follow-up: 6 months  Kamiryn Bezanson Bluford DO [facility]

## 2024-09-06 NOTE — Assessment & Plan Note (Signed)
 Stable.  Continue current medications.

## 2024-09-06 NOTE — Patient Instructions (Signed)
Labs today.  Follow up in 6 months.  Take care  Dr. Alyxis Grippi  

## 2024-09-06 NOTE — Assessment & Plan Note (Signed)
 A1c today to assess.  Has been at goal.

## 2024-09-06 NOTE — Assessment & Plan Note (Signed)
 LDL at goal.  Lipid panel today to assess.  Continue Crestor .

## 2024-09-10 DIAGNOSIS — R7989 Other specified abnormal findings of blood chemistry: Secondary | ICD-10-CM | POA: Diagnosis not present

## 2024-09-10 DIAGNOSIS — E782 Mixed hyperlipidemia: Secondary | ICD-10-CM | POA: Diagnosis not present

## 2024-09-10 DIAGNOSIS — E119 Type 2 diabetes mellitus without complications: Secondary | ICD-10-CM | POA: Diagnosis not present

## 2024-09-11 ENCOUNTER — Ambulatory Visit: Payer: Self-pay | Admitting: Family Medicine

## 2024-09-11 LAB — CMP14+EGFR
ALT: 14 IU/L (ref 0–44)
AST: 18 IU/L (ref 0–40)
Albumin: 4.3 g/dL (ref 3.8–4.8)
Alkaline Phosphatase: 75 IU/L (ref 44–121)
BUN/Creatinine Ratio: 17 (ref 10–24)
BUN: 20 mg/dL (ref 8–27)
Bilirubin Total: 0.4 mg/dL (ref 0.0–1.2)
CO2: 23 mmol/L (ref 20–29)
Calcium: 9.4 mg/dL (ref 8.6–10.2)
Chloride: 96 mmol/L (ref 96–106)
Creatinine, Ser: 1.2 mg/dL (ref 0.76–1.27)
Globulin, Total: 2.3 g/dL (ref 1.5–4.5)
Glucose: 235 mg/dL — ABNORMAL HIGH (ref 70–99)
Potassium: 4.6 mmol/L (ref 3.5–5.2)
Sodium: 133 mmol/L — ABNORMAL LOW (ref 134–144)
Total Protein: 6.6 g/dL (ref 6.0–8.5)
eGFR: 63 mL/min/1.73 (ref >59)

## 2024-09-11 LAB — MICROALBUMIN / CREATININE URINE RATIO
Creatinine, Urine: 51.4 mg/dL
Microalb/Creat Ratio: 10 mg/g{creat} (ref 0–29)
Microalbumin, Urine: 5.1 ug/mL

## 2024-09-11 LAB — CBC
Hematocrit: 42.2 % (ref 37.5–51.0)
Hemoglobin: 13.4 g/dL (ref 13.0–17.7)
MCH: 29 pg (ref 26.6–33.0)
MCHC: 31.8 g/dL (ref 31.5–35.7)
MCV: 91 fL (ref 79–97)
Platelets: 168 x10E3/uL (ref 150–450)
RBC: 4.62 x10E6/uL (ref 4.14–5.80)
RDW: 13.1 % (ref 11.6–15.4)
WBC: 7.9 x10E3/uL (ref 3.4–10.8)

## 2024-09-11 LAB — LIPID PANEL
Chol/HDL Ratio: 3.2 ratio (ref 0.0–5.0)
Cholesterol, Total: 120 mg/dL (ref 100–199)
HDL: 37 mg/dL — ABNORMAL LOW (ref >39)
LDL Chol Calc (NIH): 53 mg/dL (ref 0–99)
Triglycerides: 179 mg/dL — ABNORMAL HIGH (ref 0–149)
VLDL Cholesterol Cal: 30 mg/dL (ref 5–40)

## 2024-09-11 LAB — HEMOGLOBIN A1C
Est. average glucose Bld gHb Est-mCnc: 192 mg/dL
Hgb A1c MFr Bld: 8.3 % — ABNORMAL HIGH (ref 4.8–5.6)

## 2024-09-13 DIAGNOSIS — Z23 Encounter for immunization: Secondary | ICD-10-CM | POA: Diagnosis not present

## 2024-09-13 NOTE — Telephone Encounter (Signed)
-----   Message from Jacqulyn KANDICE Ahle sent at 09/11/2024  8:18 PM EDT ----- A1c not at goal. Recommend addition of SGLT2 (Jardiance  or Doreen). ----- Message ----- From: Rebecka Memos Lab Results In Sent: 09/11/2024   7:38 AM EDT To: Jayce G Cook, DO

## 2024-09-13 NOTE — Telephone Encounter (Signed)
 Left message for patient to call back to discuss results and recommendations.  Ok for E2C2 to discuss with patient and gather response

## 2024-09-14 NOTE — Telephone Encounter (Unsigned)
 Copied from CRM [id number]. Topic: Clinical - Medical Advice >> Sep 14, 2024 11:13 AM Carlatta H wrote: Reason for CRM: Advised patient per chart;A1c not at goal. Recommend addition of SGLT2 (Jardiance  or [place])  Patient is unable to get (Jardiance  or Farxiga) due to being on a fixed income

## 2024-09-27 DIAGNOSIS — Z23 Encounter for immunization: Secondary | ICD-10-CM | POA: Diagnosis not present

## 2024-10-14 ENCOUNTER — Ambulatory Visit: Attending: Cardiology | Admitting: Cardiology

## 2024-10-14 ENCOUNTER — Encounter: Payer: Self-pay | Admitting: Cardiology

## 2024-10-14 VITALS — BP 130/72 | HR 93 | Ht 71.0 in | Wt 230.4 lb

## 2024-10-14 DIAGNOSIS — I5022 Chronic systolic (congestive) heart failure: Secondary | ICD-10-CM | POA: Insufficient documentation

## 2024-10-14 DIAGNOSIS — I10 Essential (primary) hypertension: Secondary | ICD-10-CM | POA: Insufficient documentation

## 2024-10-14 DIAGNOSIS — E782 Mixed hyperlipidemia: Secondary | ICD-10-CM | POA: Diagnosis not present

## 2024-10-14 NOTE — Patient Instructions (Addendum)
 Medication Instructions:  Continue all current medications.   Labwork: none  Testing/Procedures: none  Follow-Up: 6 months   Any Other Special Instructions Will Be Listed Below (If Applicable).   If you need a refill on your cardiac medications before your next appointment, please call your pharmacy.

## 2024-10-14 NOTE — Progress Notes (Signed)
 Clinical Summary Mr. Enfield is a 73 y.o.male seen today for follow up of the following medical problems.     1. Chronic systolic HF - echo 06/2015 LVEF 40%, grade II diastolic dysfunction.   - echo Jan 2017 LVEF 40-45%, grade I diastolic dysfunction - 02/2016 nuclear stress moderate inferior defect probable attenuation, cannot rule out scar. No ischemia.  01/2023 echo: LVEF 45%,    - jardiance  too expensive. Entresto  too expensive, changed to losartan .    - no SOB/DOE, no recent edema - compliant with meds    2. HTN - compliant with meds   3. OSA  -10/2016 severe OSA by sleep study. He is down 30 lbs since that time - has not been wearing CPAP - not interested in reconsidering cpap       4. Hyperlipidmia -11/2022 TC 133 TG 173 HDL 38 LDL 66 05/2023 TC 120 TG 189 HDL 39 LDL 50 - 08/2024 TC 879 TG 820 HDL 37 LDL 53. A1c was 8 at the time.  - he is on rosuvatatin 10mg    5. DM2 - followed by pcp    Wife recently justed passed in July, 2025. Has 3 children, several grandchildren in the area.     Past Medical History:  Diagnosis Date   Diabetes mellitus without complication (HCC)    Diastolic dysfunction    grade 2   ED (erectile dysfunction)    Herniated lumbar intervertebral disc    Hypertension    Irregular heart beat    a. Pt does not know official diagnosis.   Prostate hypertrophy      No Known Allergies   Current Outpatient Medications  Medication Sig Dispense Refill   Accu-Chek Softclix Lancets lancets TEST BLOOD SUGAR ONCE DAILY AS DIRECTED DX E11.9 100 each 5   aspirin  EC 81 MG tablet Take 325 mg by mouth daily.     blood glucose meter kit and supplies Dispense based on patient and insurance preference. Use up to four times daily as directed. (FOR ICD-10 E10.9, E11.9). 1 each 0   chlorthalidone  (HYGROTON ) 25 MG tablet Take 1 tablet (25 mg total) by mouth daily. 90 tablet 3   finasteride  (PROSCAR ) 5 MG tablet Take 1 tablet (5 mg total) by mouth  daily. 90 tablet 3   glipiZIDE  (GLUCOTROL ) 5 MG tablet Take 2 tablets (10 mg total) by mouth 2 (two) times daily before a meal. 360 tablet 3   glucose blood (ONE TOUCH ULTRA TEST) test strip TEST BLOOD SUGAR ONCE DAILY AS DIRECTED **DX E11.9 100 each 4   losartan  (COZAAR ) 50 MG tablet TAKE 1 TABLET BY MOUTH EVERY DAY 90 tablet 2   metFORMIN  (GLUCOPHAGE ) 1000 MG tablet Take 1 tablet (1,000 mg total) by mouth 2 (two) times daily with a meal. 180 tablet 3   metoprolol  succinate (TOPROL -XL) 25 MG 24 hr tablet TAKE 1/2 TABLET BY MOUTH EVERY DAY 45 tablet 2   rosuvastatin  (CRESTOR ) 10 MG tablet Take 1 tablet (10 mg total) by mouth daily. 90 tablet 3   spironolactone  (ALDACTONE ) 25 MG tablet TAKE 1 TABLET (25 MG TOTAL) BY MOUTH DAILY. 90 tablet 1   tamsulosin  (FLOMAX ) 0.4 MG CAPS capsule Take 0.4 mg by mouth daily.     No current facility-administered medications for this visit.     Past Surgical History:  Procedure Laterality Date   BACK SURGERY  1994 Dr. Lang SANES SURGERY  1994 Dr. Lang   COLONOSCOPY  one polyp   LUMBAR LAMINECTOMY/DECOMPRESSION MICRODISCECTOMY Left 06/22/2014   Procedure: LUMBAR LAMINECTOMY/DECOMPRESSION MICRODISCECTOMY 1 LEVEL;  Surgeon: Rockey LITTIE Peru, MD;  Location: [facility];  Service: Neurosurgery;  Laterality: Left;  Redo Left Lumbar Three-Four Microdiskectomy     No Known Allergies    Family History  Problem Relation Age of Onset   Hypertension Mother    Diabetes Mother    Transient ischemic attack Mother        Died of ministrokes at 58   Depression Father        Died of grieving   COPD Brother        Died of COPD   Throat cancer Sister        Died of throat CA   Colon cancer Neg Hx      Social History Mr. Equihua reports that he has never smoked. He has never used smokeless tobacco. Mr. Mckelvy reports no history of alcohol use.   Physical Examination Today's Vitals   10/14/24 0953  BP: 130/72  Pulse: 93  SpO2: 96%  Weight:  230 lb 6.4 oz (104.5 kg)  Height: 5' 11 (1.803 m)   Body mass index is 32.13 kg/m.  Gen: resting comfortably, no acute distress HEENT: no scleral icterus, pupils equal round and reactive, no palptable cervical adenopathy,  CV: RRR, no m/rg, no jvd Resp: Clear to auscultation bilaterally GI: abdomen is soft, non-tender, non-distended, normal bowel sounds, no hepatosplenomegaly MSK: extremities are warm, no edema.  Skin: warm, no rash Neuro:  no focal deficits Psych: appropriate affect   Diagnostic Studies  06/2015 echo Study Conclusions  - Left ventricle: The cavity size was normal. Wall thickness was   increased in a pattern of mild LVH. Systolic function was mildly   to moderately reduced. The estimated ejection fraction was = 40%.   No regional wall motion abnormalities. Features are consistent   with a pseudonormal left ventricular filling pattern, with   concomitant abnormal relaxation and increased filling pressure   (grade 2 diastolic dysfunction). - Aortic valve: Mildly calcified annulus. Trileaflet; mildly   thickened leaflets. Valve area (VTI): 2.41 cm^2. Valve area   (Vmax): 2.49 cm^2. - Mitral valve: There was mild regurgitation. - Left atrium: The atrium was moderately dilated. - Pulmonary veins: There is blunting of systolic pulmonary vein   flow consistent with elevated LA pressures. - Atrial septum: No defect or patent foramen ovale was identified. - Technically difficult study. Echo contrast was used to enhance   visualization.   Jan 2017 echo Study Conclusions  - Left ventricle: The cavity size was normal. Wall thickness was   increased in a pattern of mild LVH. Systolic function was mildly   to moderately reduced. The estimated ejection fraction was in the   range of 40% to 45%. Diffuse hypokinesis. Doppler parameters are   consistent with abnormal left ventricular relaxation (grade 1   diastolic dysfunction). Normal filling pressures. - Aortic  valve: Mildly calcified annulus. Trileaflet; mildly   calcified leaflets. - Mitral valve: There was trivial regurgitation. - Left atrium: The atrium was mildly dilated. - Right atrium: Central venous pressure (est): 3 mm Hg. - Tricuspid valve: There was trivial regurgitation. - Pulmonary arteries: Systolic pressure could not be accurately   estimated. - Pericardium, extracardiac: There was no pericardial effusion.  Impressions:  - Mild LVH with LVEF approximately 40-45%, diffuse hypokinesis with   regional variation. Grade 1 diastolic dysfunction with normal   estimated filling pressures. Mild left atrial  enlargement.   Trivial mitral regurgitation. Mildly sclerotic aortic valve.   Trivial tricuspid regurgitation, PASP not estimated.   02/2016 Exercise MPI Equivocal ST segment abnormalities noted in leads II, III, aVF, and V4 through V6 in the absence of chest pain. Suboptimal heart rate response achieved on treadmill, Lexiscan  utilized. Occasional to frequent PVCs. Blood pressure demonstrated a hypertensive response to exercise. Moderate-sized, moderate intensity, inferior and basal inferoseptal defect that is fixed and most consistent with soft tissue attenuation, although scar is also possible. This is a high risk study based on calculated LVEF. Nuclear stress EF: 28%.     01/2023 echo 1. Left ventricular ejection fraction, by estimation, is 45%. The left  ventricle has mildly decreased function. The left ventricle has no  regional wall motion abnormalities. Indeterminate diastolic filling due to  E-A fusion. The average left ventricular   global longitudinal strain is -14.2 %. The global longitudinal strain is  abnormal.   2. Right ventricular systolic function is normal. The right ventricular  size is normal.   3. The mitral valve is abnormal. No evidence of mitral valve  regurgitation. No evidence of mitral stenosis. Moderate mitral annular  calcification.   4. The aortic  valve is tricuspid. There is mild calcification of the  aortic valve. Aortic valve regurgitation is not visualized. No aortic  stenosis is present.   5. The inferior vena cava is normal in size with greater than 50%  respiratory variability, suggesting right atrial pressure of 3 mmHg.    Assessment and Plan  1. Chronic HFmrEF - LVEF 45%, - noninvasive ischemic testing without clear evidence of ICM - jardiance  too expensive. Entresto  currently too expensive, changed to losartan  50mg  - denies any symptoms, continue current meds     2. HTN - bp is at goal, continue current meds   3. Hyperlipidemia - LDL at goal, TGs should improve with improved control of blood sugars - continue current meds     Dorn PHEBE Ross, M.D.

## 2024-11-27 ENCOUNTER — Other Ambulatory Visit: Payer: Self-pay | Admitting: Family Medicine

## 2024-11-27 ENCOUNTER — Other Ambulatory Visit: Payer: Self-pay | Admitting: Cardiology

## 2024-11-27 DIAGNOSIS — I5042 Chronic combined systolic (congestive) and diastolic (congestive) heart failure: Secondary | ICD-10-CM

## 2024-11-27 DIAGNOSIS — I10 Essential (primary) hypertension: Secondary | ICD-10-CM

## 2024-12-16 ENCOUNTER — Telehealth: Payer: Self-pay

## 2024-12-16 NOTE — Telephone Encounter (Signed)
 Patient was identified as falling into the [place] Measure - Diabetes.   Patient was: Appointment already scheduled for:  03/07/24 for 6 month FU.

## 2025-03-07 ENCOUNTER — Ambulatory Visit: Admitting: Family Medicine

## 2025-03-11 ENCOUNTER — Ambulatory Visit
